# Patient Record
Sex: Female | Born: 1950
Health system: Southern US, Community
[De-identification: ages and names within clinical notes are randomized; demographics above are authoritative.]

## PROBLEM LIST (undated history)

## (undated) DIAGNOSIS — M1990 Unspecified osteoarthritis, unspecified site: Secondary | ICD-10-CM

## (undated) DIAGNOSIS — E876 Hypokalemia: Secondary | ICD-10-CM

## (undated) DIAGNOSIS — I10 Essential (primary) hypertension: Secondary | ICD-10-CM

## (undated) DIAGNOSIS — H269 Unspecified cataract: Secondary | ICD-10-CM

## (undated) DIAGNOSIS — Z9289 Personal history of other medical treatment: Secondary | ICD-10-CM

## (undated) DIAGNOSIS — E785 Hyperlipidemia, unspecified: Secondary | ICD-10-CM

## (undated) DIAGNOSIS — N952 Postmenopausal atrophic vaginitis: Secondary | ICD-10-CM

## (undated) DIAGNOSIS — D649 Anemia, unspecified: Secondary | ICD-10-CM

## (undated) DIAGNOSIS — R609 Edema, unspecified: Secondary | ICD-10-CM

## (undated) DIAGNOSIS — G479 Sleep disorder, unspecified: Secondary | ICD-10-CM

## (undated) HISTORY — DX: Edema, unspecified: R60.9

## (undated) HISTORY — DX: Essential (primary) hypertension: I10

## (undated) HISTORY — PX: BREAST EXCISIONAL BIOPSY: SUR124

## (undated) HISTORY — DX: Hyperlipidemia, unspecified: E78.5

## (undated) HISTORY — DX: Sleep disorder, unspecified: G47.9

## (undated) HISTORY — DX: Personal history of other medical treatment: Z92.89

## (undated) HISTORY — DX: Anemia, unspecified: D64.9

## (undated) HISTORY — DX: Postmenopausal atrophic vaginitis: N95.2

## (undated) HISTORY — PX: BREAST LUMPECTOMY: SHX2

## (undated) HISTORY — DX: Hypokalemia: E87.6

## (undated) HISTORY — PX: POLYPECTOMY: SHX149

## (undated) HISTORY — DX: Unspecified osteoarthritis, unspecified site: M19.90

## (undated) HISTORY — PX: COLONOSCOPY: SHX174

## (undated) HISTORY — PX: ABDOMINAL HYSTERECTOMY: SHX81

## (undated) HISTORY — DX: Unspecified cataract: H26.9

---

## 1998-05-03 ENCOUNTER — Encounter: Payer: Self-pay | Admitting: *Deleted

## 1998-05-03 ENCOUNTER — Ambulatory Visit (HOSPITAL_COMMUNITY): Admission: RE | Admit: 1998-05-03 | Discharge: 1998-05-03 | Payer: Self-pay | Admitting: *Deleted

## 1999-05-12 ENCOUNTER — Encounter: Payer: Self-pay | Admitting: Obstetrics and Gynecology

## 1999-05-12 ENCOUNTER — Encounter: Admission: RE | Admit: 1999-05-12 | Discharge: 1999-05-12 | Payer: Self-pay | Admitting: Obstetrics and Gynecology

## 2000-05-17 ENCOUNTER — Encounter: Payer: Self-pay | Admitting: Obstetrics and Gynecology

## 2000-05-17 ENCOUNTER — Encounter: Admission: RE | Admit: 2000-05-17 | Discharge: 2000-05-17 | Payer: Self-pay | Admitting: Obstetrics and Gynecology

## 2001-07-05 ENCOUNTER — Encounter: Payer: Self-pay | Admitting: Obstetrics and Gynecology

## 2001-07-05 ENCOUNTER — Encounter: Admission: RE | Admit: 2001-07-05 | Discharge: 2001-07-05 | Payer: Self-pay | Admitting: Obstetrics and Gynecology

## 2003-03-08 ENCOUNTER — Encounter: Admission: RE | Admit: 2003-03-08 | Discharge: 2003-03-08 | Payer: Self-pay | Admitting: Obstetrics and Gynecology

## 2004-04-03 ENCOUNTER — Emergency Department (HOSPITAL_COMMUNITY): Admission: EM | Admit: 2004-04-03 | Discharge: 2004-04-03 | Payer: Self-pay | Admitting: Emergency Medicine

## 2004-04-16 ENCOUNTER — Encounter: Admission: RE | Admit: 2004-04-16 | Discharge: 2004-04-16 | Payer: Self-pay | Admitting: Obstetrics and Gynecology

## 2005-05-14 ENCOUNTER — Encounter: Admission: RE | Admit: 2005-05-14 | Discharge: 2005-05-14 | Payer: Self-pay | Admitting: Obstetrics and Gynecology

## 2005-07-27 ENCOUNTER — Ambulatory Visit (HOSPITAL_COMMUNITY): Admission: RE | Admit: 2005-07-27 | Discharge: 2005-07-27 | Payer: Self-pay | Admitting: *Deleted

## 2006-05-17 ENCOUNTER — Encounter: Admission: RE | Admit: 2006-05-17 | Discharge: 2006-05-17 | Payer: Self-pay | Admitting: Obstetrics and Gynecology

## 2007-05-25 ENCOUNTER — Encounter: Admission: RE | Admit: 2007-05-25 | Discharge: 2007-05-25 | Payer: Self-pay | Admitting: Obstetrics and Gynecology

## 2008-05-25 ENCOUNTER — Encounter: Admission: RE | Admit: 2008-05-25 | Discharge: 2008-05-25 | Payer: Self-pay | Admitting: Obstetrics and Gynecology

## 2009-02-21 ENCOUNTER — Ambulatory Visit: Payer: Self-pay | Admitting: Family Medicine

## 2009-04-11 ENCOUNTER — Ambulatory Visit: Payer: Self-pay | Admitting: Family Medicine

## 2009-05-13 ENCOUNTER — Ambulatory Visit: Payer: Self-pay | Admitting: Family Medicine

## 2009-05-20 ENCOUNTER — Telehealth (INDEPENDENT_AMBULATORY_CARE_PROVIDER_SITE_OTHER): Payer: Self-pay

## 2009-05-21 ENCOUNTER — Encounter (HOSPITAL_COMMUNITY): Admission: RE | Admit: 2009-05-21 | Discharge: 2009-07-31 | Payer: Self-pay | Admitting: Family Medicine

## 2009-05-21 ENCOUNTER — Ambulatory Visit: Payer: Self-pay

## 2009-05-21 ENCOUNTER — Ambulatory Visit: Payer: Self-pay | Admitting: Cardiovascular Disease

## 2009-05-27 ENCOUNTER — Encounter: Admission: RE | Admit: 2009-05-27 | Discharge: 2009-05-27 | Payer: Self-pay | Admitting: Obstetrics and Gynecology

## 2009-07-31 ENCOUNTER — Ambulatory Visit: Payer: Self-pay | Admitting: Family Medicine

## 2009-08-13 ENCOUNTER — Ambulatory Visit: Payer: Self-pay | Admitting: Family Medicine

## 2009-11-11 ENCOUNTER — Ambulatory Visit: Payer: Self-pay | Admitting: Family Medicine

## 2009-12-09 ENCOUNTER — Ambulatory Visit: Payer: Self-pay | Admitting: Family Medicine

## 2010-03-27 ENCOUNTER — Ambulatory Visit: Payer: Self-pay | Admitting: Family Medicine

## 2010-04-02 ENCOUNTER — Ambulatory Visit: Payer: Self-pay | Admitting: Family Medicine

## 2010-05-17 ENCOUNTER — Encounter: Payer: Self-pay | Admitting: Obstetrics and Gynecology

## 2010-05-17 ENCOUNTER — Other Ambulatory Visit: Payer: Self-pay | Admitting: Obstetrics and Gynecology

## 2010-05-17 DIAGNOSIS — Z0000 Encounter for general adult medical examination without abnormal findings: Secondary | ICD-10-CM

## 2010-05-28 ENCOUNTER — Ambulatory Visit: Payer: Self-pay

## 2010-05-29 NOTE — Assessment & Plan Note (Signed)
Summary: Cardiology Nuclear Study  Nuclear Med Background Indications for Stress Test: Evaluation for Ischemia, Abnormal EKG    History Comments: NO DOCUMENTED CAD  Symptoms: Chest Pressure, Fatigue, Palpitations, SOB  Symptoms Comments: c/o (L) arm numbness. Last episode of ZO:XWRU past weekend.   Nuclear Pre-Procedure Cardiac Risk Factors: History of Smoking, Hypertension, Lipids, Obesity Caffeine/Decaff Intake: None NPO After: 9:00 PM Lungs: Clear IV 0.9% NS with Angio Cath: 22g     IV Site: (R) AC IV Started by: Irean Hong RN Chest Size (in) 44     Cup Size D     Height (in): 64.5 Weight (lb): 201 BMI: 34.09  Nuclear Med Study 1 or 2 day study:  1 day     Stress Test Type:  Stress Reading MD:  Charlton Haws, MD     Referring MD:  Standley Dakins, MD Resting Radionuclide:  Technetium 60m Tetrofosmin     Resting Radionuclide Dose:  10.8 mCi  Stress Radionuclide:  Technetium 5m Tetrofosmin     Stress Radionuclide Dose:  33.0 mCi   Stress Protocol Exercise Time (min):  7:15 min     Max HR:  150 bpm     Predicted Max HR:  164 bpm  Max Systolic BP: 159 mm Hg     Percent Max HR:  91.46 %     METS: 8.9 Rate Pressure Product:  04540    Stress Test Technologist:  [employer] CMA-N     Nuclear Technologist:  Burna Mortimer Deal RT-N  Rest Procedure  Myocardial perfusion imaging was performed at rest 45 minutes following the intravenous administration of Myoview Technetium 86m Tetrofosmin.  Stress Procedure  The patient exercised for 7:15.  The patient stopped due to fatigue.  She c/o slight, nonspecific chest pressure at peak exercise, that resolved immediately post exercise.  There were no significant ST-T wave changes.  Myoview was injected at peak exercise and myocardial perfusion imaging was performed after a brief delay.  QPS Raw Data Images:  Normal; no motion artifact; normal heart/lung ratio. Stress Images:  NI: Uniform and normal uptake of tracer in all myocardial  segments. Rest Images:  Normal homogeneous uptake in all areas of the myocardium. Subtraction (SDS):  Normal Transient Ischemic Dilatation:  1.14  (Normal <1.22)  Lung/Heart Ratio:  .32  (Normal <0.45)  Quantitative Gated Spect Images QGS EDV:  54 ml QGS ESV:  12 ml QGS EF:  78 % QGS cine images:  normal  Findings Normal nuclear study      Overall Impression  Exercise Capacity: Fair exercise capacity. BP Response: Normal blood pressure response. Clinical Symptoms: Fatigue ECG Impression: No significant ST segment change suggestive of ischemia. Overall Impression: Normal stress nuclear study. Overall Impression Comments: Normal

## 2010-05-29 NOTE — Progress Notes (Signed)
Summary: Nuc. Pre-Procedure  Phone Note Outgoing Call Call back at [facility] Phone [phone]   Call placed by: Irean Hong, RN,  May 20, 2009 11:33 AM Summary of Call: Reviewed information on Myoview Information Sheet (see scanned document for further details).  Spoke with patient.     Nuclear Med Background Indications for Stress Test: Evaluation for Ischemia, Abnormal EKG     Symptoms: Chest Pain, Chest Pressure, SOB  Symptoms Comments: Radiates to back.   Nuclear Pre-Procedure Cardiac Risk Factors: History of Smoking, Hypertension, Lipids

## 2010-06-16 ENCOUNTER — Ambulatory Visit (INDEPENDENT_AMBULATORY_CARE_PROVIDER_SITE_OTHER): Payer: Federal, State, Local not specified - PPO | Admitting: Family Medicine

## 2010-06-16 ENCOUNTER — Ambulatory Visit
Admission: RE | Admit: 2010-06-16 | Discharge: 2010-06-16 | Disposition: A | Discharge status: Home / Self Care | Payer: Federal, State, Local not specified - PPO | Source: Ambulatory Visit | Attending: Obstetrics and Gynecology | Admitting: Obstetrics and Gynecology

## 2010-06-16 DIAGNOSIS — Z79899 Other long term (current) drug therapy: Secondary | ICD-10-CM

## 2010-06-16 DIAGNOSIS — I10 Essential (primary) hypertension: Secondary | ICD-10-CM

## 2010-06-16 DIAGNOSIS — Z0000 Encounter for general adult medical examination without abnormal findings: Secondary | ICD-10-CM

## 2010-06-16 DIAGNOSIS — E871 Hypo-osmolality and hyponatremia: Secondary | ICD-10-CM

## 2010-09-12 NOTE — Op Note (Signed)
[name], Lisa Hutchinson              ACCOUNT NO.:  [id number]   MEDICAL RECORD NO.:  [id number]          PATIENT TYPE:  AMB   LOCATION:  ENDO                         FACILITY:  [facility]   PHYSICIAN:  Georgiana Spinner, M.D.    DATE OF BIRTH:  1950-05-09   DATE OF PROCEDURE:  DATE OF DISCHARGE:                                 OPERATIVE REPORT   PROCEDURE:  Colonoscopy.   INDICATIONS:  Colon cancer screening.   ANESTHESIA:  Fentanyl 100 mcg, Versed 8 mg.   PROCEDURE:  With the patient mildly sedated in the left lateral decubitus  position, the Olympus videoscopic colonoscope was inserted into the rectum  and passed under direct vision to the cecum, identified by the ileocecal  valve and base of the cecum.  From this point, the colonoscope was slowly  withdrawn, taking circumferential views of the colonic mucosa, stopping only  in the rectum, which appeared normal on direct and showed hemorrhoids on  retroflexed view.  The endoscope was straightened and withdrawn.  Patient's  vital signs and pulse oximetry remained stable.  Patient tolerated the  procedure well without apparent complications.   FINDINGS:  Internal hemorrhoids, otherwise unremarkable colonoscopic  examination to the cecum.   PLAN:  Consider repeat examination in 5-10 years.           ______________________________  Georgiana Spinner, M.D.     GMO/MEDQ  D:  07/27/2005  T:  07/28/2005  Job:  [id number]   cc:   [facility]   S. Kyra Manges, M.D.  Fax: [phone]

## 2010-10-10 ENCOUNTER — Telehealth: Payer: Self-pay | Admitting: *Deleted

## 2010-10-10 ENCOUNTER — Ambulatory Visit (AMBULATORY_SURGERY_CENTER): Payer: Federal, State, Local not specified - PPO | Admitting: *Deleted

## 2010-10-10 VITALS — Ht 64.0 in | Wt 186.2 lb

## 2010-10-10 DIAGNOSIS — Z1211 Encounter for screening for malignant neoplasm of colon: Secondary | ICD-10-CM

## 2010-10-10 MED ORDER — PEG-KCL-NACL-NASULF-NA ASC-C 100 G PO SOLR: ORAL | Status: DC

## 2010-10-10 NOTE — Telephone Encounter (Signed)
Patient here today for previsit. She has had colonoscopy in 2007 with Dr.Orr (information in [place]) showing int. Hemorrhoids only. He documented to repeat colonoscopy in 5-10 years. Patient denies any GI problems or symptoms today. She did state that her mom had colon surgery but no colon cancer and no one in her family has colon cancer. We looked up her mom's information in E-chart and it showed she had right colectomy due to ascending colon mass-"inflammatory looking lesion" and also states she had adenomatous polyps removed. Is it okay for patient to go ahead and have screening colonoscopy? Thanks [employer]

## 2010-10-13 ENCOUNTER — Encounter: Payer: Self-pay | Admitting: Gastroenterology

## 2010-10-13 NOTE — Telephone Encounter (Signed)
Robbin, let me know if you need anything.

## 2010-10-13 NOTE — Telephone Encounter (Signed)
yes

## 2010-10-22 ENCOUNTER — Encounter: Payer: Self-pay | Admitting: Gastroenterology

## 2010-10-22 ENCOUNTER — Other Ambulatory Visit: Payer: Federal, State, Local not specified - PPO | Admitting: Gastroenterology

## 2010-10-22 ENCOUNTER — Ambulatory Visit (AMBULATORY_SURGERY_CENTER): Payer: Federal, State, Local not specified - PPO | Admitting: Gastroenterology

## 2010-10-22 DIAGNOSIS — Z1211 Encounter for screening for malignant neoplasm of colon: Secondary | ICD-10-CM | POA: Insufficient documentation

## 2010-10-22 DIAGNOSIS — Z83719 Family history of colon polyps, unspecified: Secondary | ICD-10-CM

## 2010-10-22 DIAGNOSIS — Z8371 Family history of colonic polyps: Secondary | ICD-10-CM

## 2010-10-22 DIAGNOSIS — D126 Benign neoplasm of colon, unspecified: Secondary | ICD-10-CM

## 2010-10-22 DIAGNOSIS — K644 Residual hemorrhoidal skin tags: Secondary | ICD-10-CM

## 2010-10-22 DIAGNOSIS — Z860101 Personal history of adenomatous and serrated colon polyps: Secondary | ICD-10-CM | POA: Insufficient documentation

## 2010-10-22 DIAGNOSIS — Z86010 Personal history of colonic polyps: Secondary | ICD-10-CM | POA: Insufficient documentation

## 2010-10-22 MED ORDER — SODIUM CHLORIDE 0.9 % IV SOLN: 500.0000 mL | INTRAVENOUS | Status: DC

## 2010-10-22 NOTE — Patient Instructions (Signed)
Please read the handouts given to you by your recovery room nurse.   We will send you a letter within a couple of weeks telling you when to come back and what type of polyp.   Resume your routine medications today.   Call us if you have any questions or concerns at [phone]. Thank-you.

## 2010-10-23 ENCOUNTER — Telehealth: Payer: Self-pay

## 2010-10-23 NOTE — Telephone Encounter (Signed)
No ID on answering machine. 

## 2010-11-02 ENCOUNTER — Encounter: Payer: Self-pay | Admitting: Gastroenterology

## 2010-11-07 ENCOUNTER — Telehealth: Payer: Self-pay | Admitting: Family Medicine

## 2010-11-07 MED ORDER — POTASSIUM CHLORIDE CRYS ER 10 MEQ PO TBCR: 10.0000 meq | EXTENDED_RELEASE_TABLET | Freq: Every day | ORAL | Status: DC

## 2010-11-07 NOTE — Telephone Encounter (Signed)
DONE

## 2010-12-05 ENCOUNTER — Encounter: Payer: Self-pay | Admitting: Family Medicine

## 2010-12-05 ENCOUNTER — Telehealth: Payer: Self-pay | Admitting: Family Medicine

## 2010-12-05 NOTE — Telephone Encounter (Signed)
Pt wants written potassium rx will pick up monday

## 2010-12-08 MED ORDER — POTASSIUM CHLORIDE CRYS ER 10 MEQ PO TBCR: 10.0000 meq | EXTENDED_RELEASE_TABLET | Freq: Every day | ORAL | Status: DC

## 2011-03-17 ENCOUNTER — Encounter: Payer: Self-pay | Admitting: Family Medicine

## 2011-03-17 ENCOUNTER — Ambulatory Visit (INDEPENDENT_AMBULATORY_CARE_PROVIDER_SITE_OTHER): Payer: Federal, State, Local not specified - PPO | Admitting: Family Medicine

## 2011-03-17 VITALS — BP 128/88 | HR 74 | Wt 197.0 lb

## 2011-03-17 DIAGNOSIS — M76899 Other specified enthesopathies of unspecified lower limb, excluding foot: Secondary | ICD-10-CM

## 2011-03-17 DIAGNOSIS — M7062 Trochanteric bursitis, left hip: Secondary | ICD-10-CM

## 2011-03-17 NOTE — Patient Instructions (Signed)
Take 4 Advil 3 times per day. Use heat for 20 minutes 3 times per day. Return here if her no better after about 2 weeks. He can do whatever he wants it doesn't hurt.

## 2011-03-17 NOTE — Progress Notes (Signed)
  Subjective:    Patient ID: Lisa Hutchinson, female    DOB: 08/26/1950, 60 y.o.   MRN: [id number]  HPI She has a two-week history of left hip pain. No previous history of injuries. She has been walking regularly. She has tried OTC medications but only sparingly.   Review of Systems     Objective:   Physical Exam Full motion of the hip without pain. Back exam shows no tenderness to palpation. She does have slight tenderness over the greater trochanter.       Assessment & Plan:   1. Trochanteric bursitis of left hip    I discussed options with her concerning care and will start initially with ibuprofen 800 mg 3 times a day as well as heat. If this is not successful, recommend she return here for an injection.

## 2011-03-30 ENCOUNTER — Encounter: Payer: Self-pay | Admitting: Family Medicine

## 2011-03-30 ENCOUNTER — Ambulatory Visit (INDEPENDENT_AMBULATORY_CARE_PROVIDER_SITE_OTHER): Payer: Federal, State, Local not specified - PPO | Admitting: Family Medicine

## 2011-03-30 VITALS — BP 110/72 | HR 80 | Wt 195.0 lb

## 2011-03-30 DIAGNOSIS — M7062 Trochanteric bursitis, left hip: Secondary | ICD-10-CM

## 2011-03-30 DIAGNOSIS — M76899 Other specified enthesopathies of unspecified lower limb, excluding foot: Secondary | ICD-10-CM

## 2011-03-30 NOTE — Patient Instructions (Signed)
If this does not take the pain away, come back for further evaluation.

## 2011-03-30 NOTE — Progress Notes (Signed)
  Subjective:    Patient ID: Lisa Hutchinson, female    DOB: 09/03/1950, 60 y.o.   MRN: [id number]  HPI She continues to complain of left hip pain. She did not get much relief with the use of anti-inflammatory medications.   Review of Systems     Objective:   Physical Exam Good motion of the hip with tenderness to palpation over the greater trochanter. Questionable tenderness over the upper left SI joint with good motion of her back.       Assessment & Plan:   1. Trochanteric bursitis of left hip    40 mg of Kenalog and 2 cc of Xylocaine was injected into the left greater trochanter. She did obtain relief of her symptoms. She tolerated the procedure well. She will return here if continued difficulty.

## 2011-05-14 ENCOUNTER — Telehealth: Payer: Self-pay | Admitting: Internal Medicine

## 2011-05-14 MED ORDER — AMLODIPINE BESYLATE 10 MG PO TABS: 10.0000 mg | ORAL_TABLET | Freq: Every day | ORAL | Status: DC

## 2011-05-14 NOTE — Telephone Encounter (Signed)
done

## 2011-05-27 ENCOUNTER — Ambulatory Visit
Admission: RE | Admit: 2011-05-27 | Discharge: 2011-05-27 | Disposition: A | Discharge status: Home / Self Care | Payer: Federal, State, Local not specified - PPO | Source: Ambulatory Visit | Attending: Family Medicine | Admitting: Family Medicine

## 2011-05-27 ENCOUNTER — Ambulatory Visit (INDEPENDENT_AMBULATORY_CARE_PROVIDER_SITE_OTHER): Payer: Federal, State, Local not specified - PPO | Admitting: Family Medicine

## 2011-05-27 ENCOUNTER — Encounter: Payer: Self-pay | Admitting: Family Medicine

## 2011-05-27 DIAGNOSIS — Z79899 Other long term (current) drug therapy: Secondary | ICD-10-CM

## 2011-05-27 DIAGNOSIS — I10 Essential (primary) hypertension: Secondary | ICD-10-CM

## 2011-05-27 DIAGNOSIS — Z862 Personal history of diseases of the blood and blood-forming organs and certain disorders involving the immune mechanism: Secondary | ICD-10-CM

## 2011-05-27 DIAGNOSIS — M549 Dorsalgia, unspecified: Secondary | ICD-10-CM

## 2011-05-27 DIAGNOSIS — Z8639 Personal history of other endocrine, nutritional and metabolic disease: Secondary | ICD-10-CM

## 2011-05-27 DIAGNOSIS — D126 Benign neoplasm of colon, unspecified: Secondary | ICD-10-CM

## 2011-05-27 LAB — CBC WITH DIFFERENTIAL/PLATELET
Basophils Absolute: 0 10*3/uL (ref 0.0–0.1)
Basophils Relative: 0 % (ref 0–1)
Eosinophils Absolute: 0.3 10*3/uL (ref 0.0–0.7)
Eosinophils Relative: 4 % (ref 0–5)
HCT: 43.4 % (ref 36.0–46.0)
Hemoglobin: 13.8 g/dL (ref 12.0–15.0)
Lymphocytes Relative: 46 % (ref 12–46)
MCH: 27.7 pg (ref 26.0–34.0)
MCHC: 31.8 g/dL (ref 30.0–36.0)
MCV: 87.1 fL (ref 78.0–100.0)
Monocytes Absolute: 0.8 10*3/uL (ref 0.1–1.0)
Monocytes Relative: 11 % (ref 3–12)
Neutro Abs: 2.7 10*3/uL (ref 1.7–7.7)
Neutrophils Relative %: 39 % — ABNORMAL LOW (ref 43–77)
Platelets: 281 10*3/uL (ref 150–400)
RBC: 4.98 MIL/uL (ref 3.87–5.11)
RDW: 13.6 % (ref 11.5–15.5)
WBC: 6.9 10*3/uL (ref 4.0–10.5)

## 2011-05-27 LAB — COMPREHENSIVE METABOLIC PANEL
ALT: 8 U/L (ref 0–35)
AST: 22 U/L (ref 0–37)
Alkaline Phosphatase: 88 U/L (ref 39–117)
BUN: 9 mg/dL (ref 6–23)
CO2: 25 mEq/L (ref 19–32)
Calcium: 9.7 mg/dL (ref 8.4–10.5)
Chloride: 105 mEq/L (ref 96–112)
Creat: 0.79 mg/dL (ref 0.50–1.10)
Glucose, Bld: 72 mg/dL (ref 70–99)
Potassium: 3.9 mEq/L (ref 3.5–5.3)
Total Bilirubin: 0.5 mg/dL (ref 0.3–1.2)
Total Protein: 6.7 g/dL (ref 6.0–8.3)

## 2011-05-27 LAB — LIPID PANEL
Cholesterol: 210 mg/dL — ABNORMAL HIGH (ref 0–200)
LDL Cholesterol: 141 mg/dL — ABNORMAL HIGH (ref 0–99)
Total CHOL/HDL Ratio: 4 Ratio
VLDL: 16 mg/dL (ref 0–40)

## 2011-05-27 NOTE — Patient Instructions (Signed)
I will call you with the results of the x-ray when it comes in. You can use Advil or Aleve for the pain if you need to.

## 2011-05-27 NOTE — Progress Notes (Signed)
  Subjective:    Patient ID: Lisa Hutchinson, female    DOB: [birth year], 61 y.o.   MRN: [id number]  HPI She is here for a followup visit concerning her hip pain. She now states that the pain is more in her left back area. The shot did not give her relief for very long. Her medical record was reviewed. She does have a history of hypokalemia. She also continues on her blood pressure medications. She did have a recent colonoscopy which showed polyps and has a family history of colonic polyps.   Review of Systems     Objective:   Physical Exam Alert and in no distress. Exam of her back shows no tenderness to palpation over the lumbar spine or over the greater trochanter. Normal motion of the hip. DTRs normal. Pearlean Brownie testing was equivocal. X-ray shows degenerative changes.      Assessment & Plan:   1. Benign neoplasm of colon    2. History of hypokalemia    3. Hypertension    4. Encounter for long-term (current) use of other medications  CBC with Differential, Comprehensive metabolic panel, Lipid panel  5. Back pain  DG Lumbar Spine Complete   recommend back exercises for her and if this does not improve, she should return here for further evaluation.

## 2011-06-16 ENCOUNTER — Other Ambulatory Visit: Payer: Self-pay | Admitting: Family Medicine

## 2011-06-16 DIAGNOSIS — Z1231 Encounter for screening mammogram for malignant neoplasm of breast: Secondary | ICD-10-CM

## 2011-06-25 ENCOUNTER — Ambulatory Visit
Admission: RE | Admit: 2011-06-25 | Discharge: 2011-06-25 | Disposition: A | Discharge status: Home / Self Care | Payer: Federal, State, Local not specified - PPO | Source: Ambulatory Visit | Attending: Family Medicine | Admitting: Family Medicine

## 2011-06-25 DIAGNOSIS — Z1231 Encounter for screening mammogram for malignant neoplasm of breast: Secondary | ICD-10-CM

## 2011-10-01 ENCOUNTER — Ambulatory Visit (INDEPENDENT_AMBULATORY_CARE_PROVIDER_SITE_OTHER): Payer: Federal, State, Local not specified - PPO | Admitting: Medical

## 2011-10-01 ENCOUNTER — Encounter: Payer: Self-pay | Admitting: Medical

## 2011-10-01 VITALS — BP 132/80 | HR 72 | Temp 97.7°F | Resp 16 | Wt 200.0 lb

## 2011-10-01 DIAGNOSIS — Z129 Encounter for screening for malignant neoplasm, site unspecified: Secondary | ICD-10-CM

## 2011-10-01 DIAGNOSIS — I10 Essential (primary) hypertension: Secondary | ICD-10-CM

## 2011-10-01 DIAGNOSIS — R609 Edema, unspecified: Secondary | ICD-10-CM

## 2011-10-01 DIAGNOSIS — R0989 Other specified symptoms and signs involving the circulatory and respiratory systems: Secondary | ICD-10-CM

## 2011-10-01 DIAGNOSIS — G4730 Sleep apnea, unspecified: Secondary | ICD-10-CM

## 2011-10-01 DIAGNOSIS — R5383 Other fatigue: Secondary | ICD-10-CM

## 2011-10-01 DIAGNOSIS — R0683 Snoring: Secondary | ICD-10-CM

## 2011-10-01 MED ORDER — AMLODIPINE BESYLATE 5 MG PO TABS: 5.0000 mg | ORAL_TABLET | Freq: Every day | ORAL | Status: DC

## 2011-10-01 MED ORDER — HYDROCHLOROTHIAZIDE 12.5 MG PO TABS: 12.5000 mg | ORAL_TABLET | Freq: Every day | ORAL | Status: DC

## 2011-10-01 NOTE — Progress Notes (Signed)
Subjective:   HPI  Lisa Hutchinson is a 60 y.o. female who presents for concern for sleep apnea.  She does report snoring, and friend recorded her while she was sleeping.  There was 12 seconds between breaths.  She wakes up a lot a night.  She does not feel rested in the morning.  She would like a sleep study.  She reports swelling in legs in the feet, ankles, and calves.  She notes being on combo BP pill with diuretic in the past.   She wonders if she needs to be back on the fluid pill.  No CP, SOB, DOE, decreased urine output.  No other aggravating or relieving factors.    She notes last pap smear/pelvic exam was 2 years ago.  Wants to update this today.  Last mammogram earlier this year.   No other c/o.  The following portions of the patient's history were reviewed and updated as appropriate: allergies, current medications, past family history, past medical history, past social history, past surgical history and problem list.  Past Medical History  Diagnosis Date  . Hypertension   . Hyperlipidemia     Allergies  Allergen Reactions  . Sulfonamide Derivatives Hives    Review of Systems ROS reviewed and was negative other than noted in HPI or above.    Objective:   Physical Exam  General appearance: alert, no distress, WD/WN, AA female HEENT: normocephalic, sclerae anicteric, TMs pearly, nares patent, no discharge or erythema, pharynx normal Oral cavity: MMM, no lesions Neck: supple, no lymphadenopathy, no thyromegaly, no masses Heart: RRR, normal S1, S2, no murmurs Lungs: CTA bilaterally, no wheezes, rhonchi, or rales Abdomen: +bs, soft, non tender, non distended, no masses, no hepatomegaly, no splenomegaly Pulses: 2+ symmetric, upper and lower extremities, normal cap refill Breast: nontender, no masses or lumps, no skin changes, no nipple discharge or inversion, no axillary lymphadenopathy Gyn: Normal external genitalia without lesions, vagina with normal mucosa, s/p  hysterectomy, mild bulge of the bladder suggesting mild prolapse, no abnormal vaginal discharge.  Uterus and adnexa not enlarged, nontender, no masses.  Exam chaperoned by nurse. Rectal: anus normal, occult negative stool, no lesions; gyn exam performed by PA student Frances Furbish, supervised by me.    Assessment and Plan :   Encounter Diagnoses  Name Primary?  . Sleep apnea Yes  . Snoring   . Fatigue   . Essential hypertension, benign   . Edema   . Screening for cancer    Sleep apnea, fatigue, snoring - will refer for split night sleep study.  Discussed weight loss, raising head of bed.  HTN - will decreased norvasc to 5mg  daily, and begin HCTZ 12.5mg  daily for HTN and edema.   Edema - leg elevation, consider OTC compression hose, begin HCTZ for HTN and edema.  Screening for cancer - reviewed most recent mammogram from 06/16/11.  Breast, pelvic and rectal exam today.  Occult negative stool.  Mild atrophic vaginal changes noted.  Discussed premarin cream but she declines.  Requested records from prior pap smears/gynecology.

## 2011-10-01 NOTE — Patient Instructions (Signed)
We will set you up for a sleep study.   Begin Hydrochlorothiazide 12.5 mg once daily.  Start taking 1/2 tablet of the Norvasc once daily.  Lets see you back in 1 month for recheck and labs.   Use leg elevation and regular exercise for swelling.

## 2011-10-02 ENCOUNTER — Ambulatory Visit (HOSPITAL_BASED_OUTPATIENT_CLINIC_OR_DEPARTMENT_OTHER): Payer: Federal, State, Local not specified - PPO | Attending: Medical

## 2011-10-02 VITALS — Ht 65.0 in | Wt 200.0 lb

## 2011-10-02 DIAGNOSIS — G4710 Hypersomnia, unspecified: Secondary | ICD-10-CM | POA: Insufficient documentation

## 2011-10-02 DIAGNOSIS — Z9989 Dependence on other enabling machines and devices: Secondary | ICD-10-CM

## 2011-10-10 DIAGNOSIS — G4730 Sleep apnea, unspecified: Secondary | ICD-10-CM

## 2011-10-10 DIAGNOSIS — G4710 Hypersomnia, unspecified: Secondary | ICD-10-CM

## 2011-10-10 NOTE — Procedures (Signed)
NAMELASHONA, SCHAAF              ACCOUNT NO.:  [id number]  MEDICAL RECORD NO.:  [id number]          PATIENT TYPE:  OUT  LOCATION:  SLEEP CENTER                 FACILITY:  [facility]  PHYSICIAN:  Decklyn Hornik D. Maple Hudson, MD, FCCP, FACPDATE OF BIRTH:  1950/08/25  DATE OF STUDY:  10/02/2011                           NOCTURNAL POLYSOMNOGRAM  REFERRING PHYSICIAN:  Crosby Oyster, PA  INDICATION FOR STUDY:  Hypersomnia with sleep apnea.  EPWORTH SLEEPINESS SCORE:  5/24.  BMI 33.3, weight 200 pounds, height 65 inches, neck 13 inches.  MEDICATIONS:  Charted and reviewed.  SLEEP ARCHITECTURE:  Split study protocol.  During the diagnostic phase, total sleep time 162 minutes with sleep efficiency 73.5%.  Stage I was 7.4%, stage II 66.4%, stage III 8.6%, REM 17.6% of total sleep time. Sleep latency 23.5 minutes, REM latency 97 minutes, awake after sleep onset 28.5 minutes.  Arousal index 22.6.  Bedtime medication:  None.  RESPIRATORY DATA:  Split study protocol.  Apnea/hypopnea index (AHI) 3.7 per hour.  A total of 10 events was scored including 1 obstructive apnea, 1 central apnea, and 8 hypopneas.  CPAP was titrated to 7 CWP, AHI 2.6 per hour.  She wore a medium ResMed Quattro FX fullface mask with heated humidifier.  OXYGEN DATA:  Moderate snoring before CPAP with oxygen desaturation to a nadir of 90% on room air.  After CPAP, snoring was prevented and mean oxygen saturation held 93.4% on room air.  CARDIAC DATA:  Sinus rhythm with PACs.  MOVEMENT/PARASOMNIA:  No significant movement disturbance.  Bathroom x1.  IMPRESSION/RECOMMENDATION: 1. Occasional respiratory events with sleep disturbance, within normal     limits.  AHI 3.7 per hour (the normal range for adults is from 0-5     events per hour).  Moderate snoring with oxygen desaturation to a     nadir of 90% on room air. 2. CPAP titration was done to 7 CWP, AHI 2.6 per hour.  She wore a     medium ResMed Quattro FX fullface mask with  heated humidifier.     Ordinarily baseline scores in her range will not be treated with     CPAP.     Genia Perin D. Maple Hudson, MD, [facility], FACP Diplomate, American Board of Sleep Medicine    CDY/MEDQ  D:  10/10/2011 09:02:18  T:  10/10/2011 10:07:01  Job:  [id number]

## 2011-10-14 ENCOUNTER — Telehealth: Payer: Self-pay | Admitting: Medical

## 2011-10-14 NOTE — Telephone Encounter (Signed)
Patient called to check on results of her sleep study, please call

## 2011-10-14 NOTE — Telephone Encounter (Signed)
Karren Burly - ask Lafonda Mosses to talk with Epic people.  I am not getting notifications that sleep study results are in.  Her sleep study is in Epic, but nothing prompted me that results were ready until pt called back.  Pls call pt - i apologize for not calling sooner, but I didn't see results available as they normally come in.  Fortunately she called which prompted me to hunt down the results.    Her sleep study is normal, no sleep apnea.  The main recommendation would be weight loss, possibly elevating the head of the bed, and may want to see her dentist for possible mouthpiece device to help with snoring and positioning of the tongue.   She is due back at this time for recheck on swelling and the fluid pill.

## 2011-10-16 NOTE — Telephone Encounter (Signed)
LMOM NOTIFYING THE PATIENT OF HER SLEEP STUDY RESULTS. CLS

## 2011-10-19 ENCOUNTER — Ambulatory Visit: Payer: Federal, State, Local not specified - PPO | Admitting: Medical

## 2011-10-27 ENCOUNTER — Encounter: Payer: Self-pay | Admitting: Medical

## 2011-10-28 ENCOUNTER — Ambulatory Visit (INDEPENDENT_AMBULATORY_CARE_PROVIDER_SITE_OTHER): Payer: Federal, State, Local not specified - PPO | Admitting: Medical

## 2011-10-28 ENCOUNTER — Encounter: Payer: Self-pay | Admitting: Medical

## 2011-10-28 VITALS — BP 122/82 | HR 84 | Temp 97.9°F | Resp 16 | Wt 193.0 lb

## 2011-10-28 DIAGNOSIS — R609 Edema, unspecified: Secondary | ICD-10-CM

## 2011-10-28 DIAGNOSIS — E876 Hypokalemia: Secondary | ICD-10-CM

## 2011-10-28 DIAGNOSIS — R0683 Snoring: Secondary | ICD-10-CM

## 2011-10-28 DIAGNOSIS — R0989 Other specified symptoms and signs involving the circulatory and respiratory systems: Secondary | ICD-10-CM

## 2011-10-28 DIAGNOSIS — I10 Essential (primary) hypertension: Secondary | ICD-10-CM

## 2011-10-28 DIAGNOSIS — E669 Obesity, unspecified: Secondary | ICD-10-CM

## 2011-10-28 DIAGNOSIS — N952 Postmenopausal atrophic vaginitis: Secondary | ICD-10-CM

## 2011-10-28 DIAGNOSIS — R0609 Other forms of dyspnea: Secondary | ICD-10-CM

## 2011-10-28 LAB — BASIC METABOLIC PANEL
BUN: 11 mg/dL (ref 6–23)
Chloride: 104 mEq/L (ref 96–112)
Creat: 0.66 mg/dL (ref 0.50–1.10)
Glucose, Bld: 80 mg/dL (ref 70–99)
Potassium: 3.6 mEq/L (ref 3.5–5.3)

## 2011-10-28 MED ORDER — ESTROGENS, CONJUGATED 0.625 MG/GM VA CREA: TOPICAL_CREAM | VAGINAL | Status: DC

## 2011-10-28 MED ORDER — AMLODIPINE BESYLATE 10 MG PO TABS: 10.0000 mg | ORAL_TABLET | Freq: Every day | ORAL | Status: DC

## 2011-10-28 NOTE — Progress Notes (Signed)
Subjective: Here for f/u   Last visit about a month ago we added HCTZ 12.5mg  to help with both fluid and BP.   She notes that this is working well.  She didn't go to Amlodipine 5mg , she continued 10mg  as the 5mg  wasn't working strong enough.  Thus, she is taking Amlodipine 10mg  + HCTZ 12.5mg .  This has helped the swelling and her BP as been fine.  She denies any new problems other than trying to lose weight.  She walks 2 miles daily.   She is here to discuss the sleep study from last visit too.  No other c/o.  The following portions of the patient's history were reviewed and updated as appropriate: allergies, current medications, past family history, past medical history, past social history, past surgical history and problem list.  Past Medical History  Diagnosis Date  . Hypertension   . Hyperlipidemia   . Edema   . Hypokalemia     Allergies  Allergen Reactions  . Sulfonamide Derivatives Hives     Review of Systems ROS reviewed and was negative other than noted in HPI or above.    Objective:   Physical Exam  General appearance: alert, no distress, WD/WN Neck: supple, no lymphadenopathy, no thyromegaly, no masses Heart: RRR, normal S1, S2, no murmurs Lungs: CTA bilaterally, no wheezes, rhonchi, or rales Pulses: 2+ symmetric, upper and lower extremities, normal cap refill   Assessment and Plan :     Encounter Diagnoses  Name Primary?  . Essential hypertension, benign Yes  . Edema   . Hypokalemia   . Obesity   . Atrophic vaginitis   . Snoring    HTN - at goal.  C/t Amlodipine 10mg , HCTZ 12.5mg  daily  Edema - improved on HCTZ 12.5mg  daily  Hypokalemia - c/t K dur and labs today  Obesity - discussed diet, c/t efforts with diet and exercise to lose weight  Atrophic vaginitis, mild - begin Premarin cream. Discussed risks/benefits  Snoring - advised weight loss, raise head of bed

## 2011-11-03 ENCOUNTER — Ambulatory Visit: Payer: Federal, State, Local not specified - PPO | Admitting: Medical

## 2012-01-03 ENCOUNTER — Other Ambulatory Visit: Payer: Self-pay | Admitting: Family Medicine

## 2012-02-01 ENCOUNTER — Other Ambulatory Visit: Payer: Self-pay | Admitting: Medical

## 2012-02-08 ENCOUNTER — Ambulatory Visit
Admission: RE | Admit: 2012-02-08 | Discharge: 2012-02-08 | Disposition: A | Discharge status: Home / Self Care | Payer: Federal, State, Local not specified - PPO | Source: Ambulatory Visit | Attending: Family Medicine | Admitting: Family Medicine

## 2012-02-08 ENCOUNTER — Ambulatory Visit (INDEPENDENT_AMBULATORY_CARE_PROVIDER_SITE_OTHER): Payer: Federal, State, Local not specified - PPO | Admitting: Family Medicine

## 2012-02-08 ENCOUNTER — Encounter: Payer: Self-pay | Admitting: Family Medicine

## 2012-02-08 VITALS — BP 122/78 | HR 84 | Wt 199.0 lb

## 2012-02-08 DIAGNOSIS — M1990 Unspecified osteoarthritis, unspecified site: Secondary | ICD-10-CM

## 2012-02-08 DIAGNOSIS — M25551 Pain in right hip: Secondary | ICD-10-CM

## 2012-02-08 DIAGNOSIS — M129 Arthropathy, unspecified: Secondary | ICD-10-CM

## 2012-02-08 DIAGNOSIS — M25559 Pain in unspecified hip: Secondary | ICD-10-CM

## 2012-02-08 NOTE — Progress Notes (Signed)
  Subjective:    Patient ID: Lisa Hutchinson, female    DOB: 14-May-1950, 61 y.o.   MRN: [id number]  HPI She is here for evaluation of right hip and groin as well as leg discomfort. She notes that the pain does get worse with walking however when she does water exercises, she has no difficulty. There's been no numbness but she does complain of some weakness. There is no history of injury.   Review of Systems     Objective:   Physical Exam Full motion of the hip without discomfort. No tenderness palpation of the greater trochanter or pelvic area. Reflexes normal. Good strength. X-ray shows degenerative changes.       Assessment & Plan:   1. Right hip pain  DG Hip Complete Right  2. Arthritis     Recommend Aleve or Advil ;also refer to orthopedics for followup.

## 2012-02-09 NOTE — Progress Notes (Signed)
Quick Note:  Faxed all info to [facility] ______

## 2012-05-03 ENCOUNTER — Other Ambulatory Visit: Payer: Self-pay | Admitting: Family Medicine

## 2012-05-03 DIAGNOSIS — Z9289 Personal history of other medical treatment: Secondary | ICD-10-CM

## 2012-05-03 DIAGNOSIS — Z1231 Encounter for screening mammogram for malignant neoplasm of breast: Secondary | ICD-10-CM

## 2012-05-03 HISTORY — DX: Personal history of other medical treatment: Z92.89

## 2012-05-06 ENCOUNTER — Other Ambulatory Visit: Payer: Self-pay | Admitting: Medical

## 2012-06-09 ENCOUNTER — Other Ambulatory Visit: Payer: Self-pay | Admitting: Family Medicine

## 2012-06-11 ENCOUNTER — Other Ambulatory Visit: Payer: Self-pay

## 2012-06-27 ENCOUNTER — Ambulatory Visit: Payer: Federal, State, Local not specified - PPO

## 2012-07-04 ENCOUNTER — Ambulatory Visit
Admission: RE | Admit: 2012-07-04 | Discharge: 2012-07-04 | Disposition: A | Discharge status: Home / Self Care | Payer: Federal, State, Local not specified - PPO | Source: Ambulatory Visit | Attending: Family Medicine | Admitting: Family Medicine

## 2012-07-04 DIAGNOSIS — Z1231 Encounter for screening mammogram for malignant neoplasm of breast: Secondary | ICD-10-CM

## 2012-07-06 ENCOUNTER — Telehealth: Payer: Self-pay | Admitting: Internal Medicine

## 2012-07-06 MED ORDER — POTASSIUM CHLORIDE CRYS ER 10 MEQ PO TBCR: 10.0000 meq | EXTENDED_RELEASE_TABLET | Freq: Every day | ORAL | Status: DC

## 2012-07-06 NOTE — Telephone Encounter (Signed)
Refill sent.

## 2012-07-09 ENCOUNTER — Other Ambulatory Visit: Payer: Self-pay | Admitting: Family Medicine

## 2012-07-18 ENCOUNTER — Ambulatory Visit (INDEPENDENT_AMBULATORY_CARE_PROVIDER_SITE_OTHER): Payer: Federal, State, Local not specified - PPO | Admitting: Medical

## 2012-07-18 ENCOUNTER — Encounter: Payer: Self-pay | Admitting: Medical

## 2012-07-18 VITALS — BP 128/82 | HR 80 | Temp 98.4°F | Resp 16 | Wt 197.0 lb

## 2012-07-18 DIAGNOSIS — E669 Obesity, unspecified: Secondary | ICD-10-CM

## 2012-07-18 DIAGNOSIS — E785 Hyperlipidemia, unspecified: Secondary | ICD-10-CM

## 2012-07-18 DIAGNOSIS — R252 Cramp and spasm: Secondary | ICD-10-CM

## 2012-07-18 DIAGNOSIS — M25551 Pain in right hip: Secondary | ICD-10-CM

## 2012-07-18 DIAGNOSIS — M25559 Pain in unspecified hip: Secondary | ICD-10-CM

## 2012-07-18 DIAGNOSIS — Z23 Encounter for immunization: Secondary | ICD-10-CM

## 2012-07-18 DIAGNOSIS — I10 Essential (primary) hypertension: Secondary | ICD-10-CM

## 2012-07-18 LAB — POCT URINALYSIS DIPSTICK
Bilirubin, UA: NEGATIVE
Glucose, UA: NEGATIVE
Nitrite, UA: NEGATIVE
Urobilinogen, UA: NEGATIVE

## 2012-07-18 LAB — CBC WITH DIFFERENTIAL/PLATELET
Basophils Absolute: 0 10*3/uL (ref 0.0–0.1)
Basophils Relative: 1 % (ref 0–1)
Eosinophils Relative: 3 % (ref 0–5)
HCT: 41.2 % (ref 36.0–46.0)
Hemoglobin: 13.7 g/dL (ref 12.0–15.0)
MCHC: 33.3 g/dL (ref 30.0–36.0)
Monocytes Absolute: 0.9 10*3/uL (ref 0.1–1.0)
Neutro Abs: 2.6 10*3/uL (ref 1.7–7.7)
Platelets: 306 10*3/uL (ref 150–400)
RBC: 4.96 MIL/uL (ref 3.87–5.11)
RDW: 13.9 % (ref 11.5–15.5)

## 2012-07-18 LAB — COMPREHENSIVE METABOLIC PANEL
Albumin: 4.2 g/dL (ref 3.5–5.2)
Alkaline Phosphatase: 70 U/L (ref 39–117)
BUN: 10 mg/dL (ref 6–23)
Glucose, Bld: 81 mg/dL (ref 70–99)
Total Bilirubin: 0.5 mg/dL (ref 0.3–1.2)

## 2012-07-18 LAB — LIPID PANEL
LDL Cholesterol: 113 mg/dL — ABNORMAL HIGH (ref 0–99)
Triglycerides: 85 mg/dL (ref <150)

## 2012-07-18 NOTE — Patient Instructions (Signed)
Check your insurer about coverage for the Shingles vaccine/Zostavax.    Work on the pelvic and upper leg stretching exercises we discussed.   If you continue to have the right pelvic/hip pain, then we can check other causes of the pelvic pain.    Try getting exercise regularly, walking, stationary bike or other exercise that doesn't aggravate your knee pains.   Eat a healthy low fat diet, try and work on some weight loss.   I recommend exercising most days of the week using a type of exercise that they would enjoy and stick to such as walking, running, swimming, hiking, biking, aerobics, etc.  I recommend a healthy diet.    Do's:   whole grains such as whole grain pasta, rice, whole grains breads and whole grain cereals.  Use small quantities such as 1/2 cup per serving or 2 slices of bread per serving.    Eat 3-5 fruits daily  Eat beans at least once daily  Eat almonds in small quantities at least 3 days per week    If they eat meat, I recommend small portions of lean meats such as chicken, fish, and [place].  Eat as much NON corn and NON potato vegetables as they like, particularly raw or steamed  Drink several large glasses of water daily  Cautions:  Limit red meat  Limit corn and potatoes  Limit sweets, cake, pie, candy  Limit beer and alcohol  Avoid fried food, fast food, large portions  Avoid sugary drinks such as regular soda and sweet tea

## 2012-07-18 NOTE — Progress Notes (Addendum)
Subjective: Here for routine follow up.    Lately seems like her feet have been cramping.  No cramping in legs, just bottom of feet.  Worse at night when in bed.  She sits at work mostly, works with [employer].  On computer 6-7 hours daily.  No restless legs, no burning, no numbness, tingling.  She is taking her klor con once daily.  Here for routine f/u on hypertension, taking her medication daily.  No salt added to foods.   Eats somewhat healthy, but on the weekends she tends to eat fast food.  Exercise - 1/2 mile walking daily.  Cut back due to knee issues.   Saw orthopedics back in the fall, diagnosed with arthritis in her knee.  She cut out walking, but still has pulling sensation in her right pelvic area.  Still has her ovaries, but not sure if her ovaries are related to her leg issues.  Seems to be the worse with walking.  sometimes feels it moving a certain way in the bed. No other aggravating or relieving factors.  No fever, weight loss, night sweats.   Allergies  Allergen Reactions  . Sulfonamide Derivatives Hives    Current Outpatient Prescriptions on File Prior to Visit  Medication Sig Dispense Refill  . amLODipine (NORVASC) 10 MG tablet TAKE ONE TABLET BY MOUTH ONCE DAILY  30 tablet  0  . aspirin 81 MG tablet Take 81 mg by mouth daily.        . Calcium Carbonate-Vitamin D (CALCIUM + D PO) Take 1 tablet by mouth daily.        . hydrochlorothiazide (MICROZIDE) 12.5 MG capsule TAKE ONE CAPSULE BY MOUTH EVERY DAY  30 capsule  5  . Omega-3 Fatty Acids (FISH OIL) 1000 MG CPDR Take 1 capsule by mouth daily.        . potassium chloride (KLOR-CON M10) 10 MEQ tablet Take 1 tablet (10 mEq total) by mouth daily.  30 tablet  1   Current Facility-Administered Medications on File Prior to Visit  Medication Dose Route Frequency Provider Last Rate Last Dose  . 0.9 %  sodium chloride infusion  500 mL Intravenous Continuous Mardella Layman, MD        Past Medical History  Diagnosis Date  .  Hypertension   . Hyperlipidemia   . Edema   . Hypokalemia   . Atrophic vaginitis   . Sleep difficulties     sleep study normal, fall 2013  . H/O mammogram 05/03/12    Past Surgical History  Procedure Laterality Date  . Breast lumpectomy      benign  . Colonoscopy  09/2010; 2007    tubular adenoma, Dr. Sheryn Bison  . Abdominal hysterectomy      fribroids    Family History  Problem Relation Age of Onset  . Colon polyps Mother 66    adenomatous polyps,ascending mass  . Hypertension Mother   . Hypertension Father   . Heart disease Neg Hx   . Hyperlipidemia Neg Hx     History   Social History  . Marital Status: Divorced    Spouse Name: N/A    Number of Children: N/A  . Years of Education: N/A   Occupational History  . Not on file.   Social History Main Topics  . Smoking status: Former Games developer  . Smokeless tobacco: Never Used  . Alcohol Use: No  . Drug Use: No  . Sexually Active: Not on file   Other Topics  Concern  . Not on file   Social History Narrative  . No narrative on file    Reviewed their medical, surgical, family, social, medication, and allergy history and updated chart as appropriate.  ROS as in subjective   Objective: Filed Vitals:   07/18/12 0809  BP: 128/82  Pulse: 80  Temp: 98.4 F (36.9 C)  Resp: 16    General appearance: alert, no distress, WD/WN, obese AA female Neck: supple, no lymphadenopathy, no thyromegaly, no masses, no bruits Heart: RRR, normal S1, S2, no murmurs Lungs: CTA bilaterally, no wheezes, rhonchi, or rales Abdomen: +bs, soft, non tender, non distended, no masses, no hepatomegaly, no splenomegaly Back: nontender MSK: right hip nontender, normal ROM, no pain with resisted ROM.  bilat feet without deformity, nontender, normal toe and foot ROM, rest of LE unremarkable Pulses: 2+ symmetric, upper and lower extremities, normal cap refill Neuro: normal LE strength, sensation   Assessment: Encounter Diagnoses  Name  Primary?  . Essential hypertension, benign Yes  . Hyperlipidemia   . Obesity, unspecified   . Need for Tdap vaccination   . Foot cramps   . Hip pain, right     Plan: HTN - controlled on current medication  Hyperlipidemia - 2 heart disease risk factors (hypertension, treated, postmenopausal not on HRT).  [place] 10 year risk of 3%.   C/t current medications, f/u pending labs.    Obesity - work on diet, weight loss, increased exercise  tdap vaccine, VIS and counseling given  Foot cramps - labs today, normal exam  Hip pain, right - history of right hip arthritis.  Ortho notes from late 2013 reviewed.   Discussed pelvic and quadriceps exercises to strength the hip flexors.  If not improving in 1-2 mo recheck.  Ortho mentioned MRI if hip pain persists.  Of note - pelvic exam due 09/2012, she just had mammogram 04/2012.  We discussed zostavax, she will check insurance coverage.  Follow-up pending labs

## 2012-07-19 ENCOUNTER — Other Ambulatory Visit: Payer: Self-pay | Admitting: Medical

## 2012-07-19 LAB — VITAMIN D 25 HYDROXY (VIT D DEFICIENCY, FRACTURES): Vit D, 25-Hydroxy: 23 ng/mL — ABNORMAL LOW (ref 30–89)

## 2012-07-19 MED ORDER — ERGOCALCIFEROL 1.25 MG (50000 UT) PO CAPS: 50000.0000 [IU] | ORAL_CAPSULE | ORAL | Status: DC

## 2012-07-19 MED ORDER — POTASSIUM CHLORIDE CRYS ER 10 MEQ PO TBCR: 10.0000 meq | EXTENDED_RELEASE_TABLET | Freq: Two times a day (BID) | ORAL | Status: DC

## 2012-07-19 MED ORDER — AMLODIPINE BESYLATE 10 MG PO TABS: 10.0000 mg | ORAL_TABLET | Freq: Every day | ORAL | Status: DC

## 2012-07-19 MED ORDER — HYDROCHLOROTHIAZIDE 12.5 MG PO CAPS: 12.5000 mg | ORAL_CAPSULE | ORAL | Status: DC

## 2012-07-25 ENCOUNTER — Ambulatory Visit: Payer: Federal, State, Local not specified - PPO

## 2012-07-25 ENCOUNTER — Other Ambulatory Visit: Payer: Self-pay | Admitting: Medical

## 2012-07-25 MED ORDER — ESTROGENS, CONJUGATED 0.625 MG/GM VA CREA: TOPICAL_CREAM | VAGINAL | Status: DC

## 2012-09-02 ENCOUNTER — Ambulatory Visit: Payer: Federal, State, Local not specified - PPO | Admitting: Medical

## 2012-09-23 ENCOUNTER — Ambulatory Visit (INDEPENDENT_AMBULATORY_CARE_PROVIDER_SITE_OTHER): Payer: Federal, State, Local not specified - PPO | Admitting: Medical

## 2012-09-23 ENCOUNTER — Encounter: Payer: Self-pay | Admitting: Medical

## 2012-09-23 VITALS — BP 130/80 | HR 60 | Temp 97.7°F | Resp 16 | Wt 198.0 lb

## 2012-09-23 DIAGNOSIS — Z23 Encounter for immunization: Secondary | ICD-10-CM

## 2012-09-23 DIAGNOSIS — R609 Edema, unspecified: Secondary | ICD-10-CM

## 2012-09-23 DIAGNOSIS — R350 Frequency of micturition: Secondary | ICD-10-CM

## 2012-09-23 DIAGNOSIS — Z2911 Encounter for prophylactic immunotherapy for respiratory syncytial virus (RSV): Secondary | ICD-10-CM

## 2012-09-23 DIAGNOSIS — E559 Vitamin D deficiency, unspecified: Secondary | ICD-10-CM

## 2012-09-23 DIAGNOSIS — E876 Hypokalemia: Secondary | ICD-10-CM

## 2012-09-23 DIAGNOSIS — M25473 Effusion, unspecified ankle: Secondary | ICD-10-CM

## 2012-09-23 LAB — BASIC METABOLIC PANEL
Calcium: 9.2 mg/dL (ref 8.4–10.5)
Sodium: 142 mEq/L (ref 135–145)

## 2012-09-23 MED ORDER — ESTRADIOL 0.1 MG/GM VA CREA: TOPICAL_CREAM | VAGINAL | Status: DC

## 2012-09-23 NOTE — Progress Notes (Signed)
Subjective: Here for recheck.  C/o some ankle/feet swelling, mild to moderate, worse in evenings.  Does improve some with elevation.  She does walk daily at lunch for exercise.   She has a sedentary job, seated all day.  She has been taking her medications as usual including Vit D prescription weekly and the BID potassium, increased last visit. She also notes some frequent urination, gets up few times every night to urinate.   She does drink fluids at bedtime and throughout the day.  She denies polydipsia, burning with urination, hematuria, odor or pain.  She did check her insurance and they will cover zostavax, wants to update this today.   No other new symptom.     Allergies  Allergen Reactions  . Sulfonamide Derivatives Hives    Current Outpatient Prescriptions on File Prior to Visit  Medication Sig Dispense Refill  . amLODipine (NORVASC) 10 MG tablet Take 1 tablet (10 mg total) by mouth daily.  30 tablet  11  . aspirin 81 MG tablet Take 81 mg by mouth daily.        . Calcium Carbonate-Vitamin D (CALCIUM + D PO) Take 1 tablet by mouth daily.        . ergocalciferol (VITAMIN D2) 50000 UNITS capsule Take 1 capsule (50,000 Units total) by mouth once a week.  4 capsule  3  . hydrochlorothiazide (MICROZIDE) 12.5 MG capsule Take 1 capsule (12.5 mg total) by mouth every morning.  30 capsule  11  . Omega-3 Fatty Acids (FISH OIL) 1000 MG CPDR Take 1 capsule by mouth daily.        . potassium chloride (K-DUR,KLOR-CON) 10 MEQ tablet Take 1 tablet (10 mEq total) by mouth 2 (two) times daily.  60 tablet  5  . conjugated estrogens (PREMARIN) vaginal cream Place vaginally 3 (three) times a week.  42.5 g  5   Current Facility-Administered Medications on File Prior to Visit  Medication Dose Route Frequency Provider Last Rate Last Dose  . 0.9 %  sodium chloride infusion  500 mL Intravenous Continuous Mardella Layman, MD        Past Medical History  Diagnosis Date  . Hypertension   . Hyperlipidemia    . Edema   . Hypokalemia   . Atrophic vaginitis   . Sleep difficulties     sleep study normal, fall 2013  . H/O mammogram 05/03/12    Past Surgical History  Procedure Laterality Date  . Breast lumpectomy      benign  . Colonoscopy  09/2010; 2007    tubular adenoma, Dr. Sheryn Bison  . Abdominal hysterectomy      fribroids    Family History  Problem Relation Age of Onset  . Colon polyps Mother 5    adenomatous polyps,ascending mass  . Hypertension Mother   . Hypertension Father   . Heart disease Neg Hx   . Hyperlipidemia Neg Hx     History   Social History  . Marital Status: Divorced    Spouse Name: N/A    Number of Children: N/A  . Years of Education: N/A   Occupational History  . Not on file.   Social History Main Topics  . Smoking status: Former Games developer  . Smokeless tobacco: Never Used  . Alcohol Use: No  . Drug Use: No  . Sexually Active: Not on file   Other Topics Concern  . Not on file   Social History Narrative  . No narrative on file  Reviewed their medical, surgical, family, social, medication, and allergy history and updated chart as appropriate.  ROS as in subjective   Objective: Filed Vitals:   09/23/12 1029  BP: 130/80  Pulse: 60  Temp: 97.7 F (36.5 C)  Resp: 16    General appearance: alert, no distress, WD/WN Neck: supple, no lymphadenopathy, no thyromegaly, no masses, no bruits, no JVD Heart: RRR, normal S1, S2, no murmurs Lungs: CTA bilaterally, no wheezes, rhonchi, or rales Abdomen: +bs, soft, non tender, non distended, no masses, no hepatomegaly, no splenomegaly Pulses: 2+ symmetric, upper and lower extremities, normal cap refill Ext: mild ankle edema, nonpitting, bilat   Assessment: Encounter Diagnoses  Name Primary?  . Hypokalemia Yes  . Unspecified vitamin D deficiency   . Ankle edema   . Frequent urination   . Need for Zostavax administration     Plan: Discussed causes of LE edema, frequent urination.   Labs today, c/t current medications.  Advised avoid NPO intake within  2 hours of bedtime.  If night time urination c/t, we can consider anticholingeric.  Advised low salt diet, leg elevation, OTC compression hose.  Gave zostavax vaccine, VIS and counseling today.  F/u pending labs.  Will need to return for pelvic/pap in near future.  She is due but declines today.

## 2012-09-23 NOTE — Addendum Note (Signed)
Addended by: Janeice Robinson on: 09/23/2012 01:21 PM   Modules accepted: Orders

## 2012-09-24 LAB — VITAMIN D 25 HYDROXY (VIT D DEFICIENCY, FRACTURES): Vit D, 25-Hydroxy: 26 ng/mL — ABNORMAL LOW (ref 30–89)

## 2012-09-25 ENCOUNTER — Other Ambulatory Visit: Payer: Self-pay | Admitting: Medical

## 2012-09-25 MED ORDER — ERGOCALCIFEROL 1.25 MG (50000 UT) PO CAPS: 50000.0000 [IU] | ORAL_CAPSULE | ORAL | Status: DC

## 2012-09-26 ENCOUNTER — Other Ambulatory Visit: Payer: Self-pay | Admitting: Medical

## 2012-09-26 MED ORDER — HYDROCHLOROTHIAZIDE 25 MG PO TABS: 25.0000 mg | ORAL_TABLET | Freq: Every day | ORAL | Status: DC

## 2012-09-28 ENCOUNTER — Encounter: Payer: Self-pay | Admitting: Family Medicine

## 2012-10-03 ENCOUNTER — Ambulatory Visit: Payer: Federal, State, Local not specified - PPO | Admitting: Medical

## 2012-11-07 ENCOUNTER — Telehealth: Payer: Self-pay | Admitting: Medical

## 2012-11-07 NOTE — Telephone Encounter (Signed)
Due back for recheck on renal function and lytes given the fluid pill, swelling, and Vit D.

## 2012-11-07 NOTE — Telephone Encounter (Signed)
I left a message on the patients voicemail. CLS 

## 2012-11-09 ENCOUNTER — Ambulatory Visit (INDEPENDENT_AMBULATORY_CARE_PROVIDER_SITE_OTHER): Payer: Federal, State, Local not specified - PPO | Admitting: Medical

## 2012-11-09 ENCOUNTER — Encounter: Payer: Self-pay | Admitting: Medical

## 2012-11-09 VITALS — BP 128/82 | HR 92 | Temp 98.2°F | Resp 16 | Wt 195.0 lb

## 2012-11-09 DIAGNOSIS — N3281 Overactive bladder: Secondary | ICD-10-CM

## 2012-11-09 DIAGNOSIS — N318 Other neuromuscular dysfunction of bladder: Secondary | ICD-10-CM

## 2012-11-09 DIAGNOSIS — E559 Vitamin D deficiency, unspecified: Secondary | ICD-10-CM

## 2012-11-09 DIAGNOSIS — I10 Essential (primary) hypertension: Secondary | ICD-10-CM

## 2012-11-09 DIAGNOSIS — R609 Edema, unspecified: Secondary | ICD-10-CM

## 2012-11-09 NOTE — Progress Notes (Signed)
Subjective: Here for recheck on edema.  Feet are still swelling, can't wear her sandals.  This is aggravating for her, despite the increase in HCTZ.  Compliant with norvasc and HCTZ 25mg  daily.  Still has to urinate several times nightly.   No incontinence.  Does not want to pursue medication for this at this time.   Has done 3 mo of prescription Vit D.  No new c/o.  Here for labs as well.  Past Medical History  Diagnosis Date  . Hypertension   . Hyperlipidemia   . Edema   . Hypokalemia   . Atrophic vaginitis   . Sleep difficulties     sleep study normal, fall 2013  . H/O mammogram 05/03/12   Reviewed their medical, surgical, family, social, medication, and allergy history and updated chart as appropriate.  ROS as in subjective   Objective: Filed Vitals:   11/09/12 0811  BP: 128/82  Pulse: 92  Temp: 98.2 F (36.8 C)  Resp: 16    General appearance: alert, no distress, WD/WN Neck: supple, no lymphadenopathy, no thyromegaly, no masses, no JVD Heart: RRR, normal S1, S2, no murmurs Lungs: CTA bilaterally, no wheezes, rhonchi, or rales Pulses: 2+ symmetric, upper and lower extremities, normal cap refill Ext: mild ankle edema, nonpitting, bilat   Assessment: Encounter Diagnoses  Name Primary?  . Essential hypertension, benign Yes  . Unspecified vitamin D deficiency   . Overactive bladder   . Edema      Plan: HTN - back off to Norvasc 5mg , c/t HCTZ 25mg , labs today Vit D deficiency - has finished 38mo prescription vit D.  Labs today.   Overactive bladder  - symptoms diary, f/u in 2wk. Edema - c/t compression hose, low salt diet, exercise.  Lets see if improvement backing off Norvasc.

## 2012-11-10 LAB — BASIC METABOLIC PANEL
Chloride: 103 mEq/L (ref 96–112)
Creat: 0.72 mg/dL (ref 0.50–1.10)
Potassium: 3.6 mEq/L (ref 3.5–5.3)
Sodium: 140 mEq/L (ref 135–145)

## 2012-11-10 LAB — VITAMIN D 25 HYDROXY (VIT D DEFICIENCY, FRACTURES): Vit D, 25-Hydroxy: 35 ng/mL (ref 30–89)

## 2012-12-28 ENCOUNTER — Telehealth: Payer: Self-pay | Admitting: Family Medicine

## 2012-12-28 MED ORDER — HYDROCHLOROTHIAZIDE 25 MG PO TABS: 25.0000 mg | ORAL_TABLET | Freq: Every day | ORAL | Status: DC

## 2012-12-28 NOTE — Telephone Encounter (Signed)
Pt has request HCTZ from [facility] on [address], she states they ususally take a long time to request.  Pt needs refill.

## 2012-12-28 NOTE — Telephone Encounter (Signed)
Medication was sent to the pharmacy. CLS

## 2013-02-21 ENCOUNTER — Ambulatory Visit (INDEPENDENT_AMBULATORY_CARE_PROVIDER_SITE_OTHER): Payer: Federal, State, Local not specified - PPO | Admitting: Family Medicine

## 2013-02-21 ENCOUNTER — Encounter: Payer: Self-pay | Admitting: Family Medicine

## 2013-02-21 VITALS — BP 122/74 | HR 88

## 2013-02-21 DIAGNOSIS — M25561 Pain in right knee: Secondary | ICD-10-CM

## 2013-02-21 DIAGNOSIS — M25569 Pain in unspecified knee: Secondary | ICD-10-CM

## 2013-02-21 DIAGNOSIS — Z23 Encounter for immunization: Secondary | ICD-10-CM

## 2013-02-21 NOTE — Progress Notes (Signed)
  Subjective:    Patient ID: Lisa Hutchinson, female    DOB: [birth year], 62 y.o.   MRN: [id number]  HPI She has a one-week history of difficulty with right knee pain. She notes especially when she gets up from a sitting position she will have knee pain that does tend to get better but does not completely go away when she starts walking. She has been using heat and Advil 4 pills 3 times per week with no relief. She has seen Dr. Darrelyn Hillock for her right knee pain the past. His note does note was reviewed and did show minimal degenerative changes in the right knee.   Review of Systems     Objective:   Physical Exam Slight tenderness palpation to the medial aspect of the joint. Brace testing was equivocal. Anterior drawer negative. Full motion of the knee. No effusion noted.       Assessment & Plan:  Need for prophylactic vaccination and inoculation against influenza - Plan: Flu Vaccine QUAD 36+ mos IM  Right knee pain  flu shot given with risks and benefits discussed. I will have her switch to Aleve 2 pills twice per day to see if this will help. Discussed followup in regard to possible injection/physical therapy/eventual referral and possible MRI. Explained that under the circumstances MRI and surgery is last or malaise. She was comfortable with this.

## 2013-02-21 NOTE — Patient Instructions (Signed)
Take 2 Aleve twice per day for the next 10 days and if still having difficulty coming here and we will discuss giving you an injection

## 2013-02-27 ENCOUNTER — Other Ambulatory Visit: Payer: Self-pay | Admitting: Obstetrics and Gynecology

## 2013-03-07 ENCOUNTER — Ambulatory Visit (INDEPENDENT_AMBULATORY_CARE_PROVIDER_SITE_OTHER): Payer: Federal, State, Local not specified - PPO | Admitting: Family Medicine

## 2013-03-07 ENCOUNTER — Ambulatory Visit: Payer: Federal, State, Local not specified - PPO | Admitting: Family Medicine

## 2013-03-07 ENCOUNTER — Encounter: Payer: Self-pay | Admitting: Family Medicine

## 2013-03-07 VITALS — BP 116/78 | HR 76 | Wt 196.0 lb

## 2013-03-07 DIAGNOSIS — M25561 Pain in right knee: Secondary | ICD-10-CM

## 2013-03-07 DIAGNOSIS — M25569 Pain in unspecified knee: Secondary | ICD-10-CM

## 2013-03-07 MED ORDER — LIDOCAINE HCL 2 % IJ SOLN
3.0000 mL | Freq: Once | INTRAMUSCULAR | Status: AC
  Administered 2013-03-07 (×2): 60 mg via INTRADERMAL

## 2013-03-07 MED ORDER — TRIAMCINOLONE ACETONIDE 40 MG/ML IJ SUSP
40.0000 mg | Freq: Once | INTRAMUSCULAR | Status: AC
  Administered 2013-03-07: 40 mg via INTRAMUSCULAR

## 2013-03-07 NOTE — Progress Notes (Signed)
  Subjective:    Patient ID: Lisa Hutchinson, female    DOB: 11/07/1950, 62 y.o.   MRN: [id number]  HPI She is here for recheck. She has been taking Advil 3 times per day and feels that she is only 20% better. Review of the record indicates she did have a previous x-ray in November of last year which was negative. She would like something more definitive done.   Review of Systems     Objective:   Physical Exam Exam of the right knee does show no swelling with good motion. No crepitus noted. No laxity noted. Negative anterior drawer.       Assessment & Plan:  Right knee pain - Plan: triamcinolone acetonide (KENALOG-40) injection 40 mg, lidocaine (XYLOCAINE) 2 % (with pres) injection 60 mg  I discussed options with her and she is decided to have it injected. The right knee was prepped laterally. 3 cc of Xylocaine and 40 mg of Kenalog was injected into the joint space without difficulty. Within several minutes she obtained 75% relief of her symptoms. She will call if she has difficulties with this. My nurse will discuss the steroid flare with her.

## 2013-03-17 ENCOUNTER — Ambulatory Visit
Admission: RE | Admit: 2013-03-17 | Discharge: 2013-03-17 | Disposition: A | Discharge status: Home / Self Care | Payer: Federal, State, Local not specified - PPO | Source: Ambulatory Visit | Attending: Obstetrics and Gynecology | Admitting: Obstetrics and Gynecology

## 2013-04-12 ENCOUNTER — Other Ambulatory Visit: Payer: Self-pay | Admitting: Family Medicine

## 2013-05-12 ENCOUNTER — Other Ambulatory Visit: Payer: Self-pay | Admitting: Medical

## 2013-06-06 ENCOUNTER — Other Ambulatory Visit: Payer: Self-pay

## 2013-06-06 DIAGNOSIS — Z1231 Encounter for screening mammogram for malignant neoplasm of breast: Secondary | ICD-10-CM

## 2013-06-12 ENCOUNTER — Other Ambulatory Visit: Payer: Self-pay | Admitting: Medical

## 2013-07-05 ENCOUNTER — Ambulatory Visit
Admission: RE | Admit: 2013-07-05 | Discharge: 2013-07-05 | Disposition: A | Discharge status: Home / Self Care | Payer: Federal, State, Local not specified - PPO | Source: Ambulatory Visit

## 2013-07-05 DIAGNOSIS — Z1231 Encounter for screening mammogram for malignant neoplasm of breast: Secondary | ICD-10-CM

## 2013-08-10 ENCOUNTER — Ambulatory Visit (INDEPENDENT_AMBULATORY_CARE_PROVIDER_SITE_OTHER): Payer: Federal, State, Local not specified - PPO | Admitting: Family Medicine

## 2013-08-10 ENCOUNTER — Encounter: Payer: Self-pay | Admitting: Family Medicine

## 2013-08-10 VITALS — BP 110/70 | HR 80 | Wt 194.0 lb

## 2013-08-10 DIAGNOSIS — N952 Postmenopausal atrophic vaginitis: Secondary | ICD-10-CM

## 2013-08-10 DIAGNOSIS — I10 Essential (primary) hypertension: Secondary | ICD-10-CM

## 2013-08-10 DIAGNOSIS — J019 Acute sinusitis, unspecified: Secondary | ICD-10-CM

## 2013-08-10 DIAGNOSIS — Z79899 Other long term (current) drug therapy: Secondary | ICD-10-CM

## 2013-08-10 LAB — CBC WITH DIFFERENTIAL/PLATELET
BASOS ABS: 0.1 10*3/uL (ref 0.0–0.1)
BASOS PCT: 1 % (ref 0–1)
EOS PCT: 7 % — AB (ref 0–5)
Eosinophils Absolute: 0.6 10*3/uL (ref 0.0–0.7)
HCT: 38.9 % (ref 36.0–46.0)
Hemoglobin: 13.4 g/dL (ref 12.0–15.0)
LYMPHS PCT: 29 % (ref 12–46)
Lymphs Abs: 2.7 10*3/uL (ref 0.7–4.0)
MCH: 28.5 pg (ref 26.0–34.0)
MCHC: 34.4 g/dL (ref 30.0–36.0)
MCV: 82.8 fL (ref 78.0–100.0)
MONO ABS: 1.2 10*3/uL — AB (ref 0.1–1.0)
Monocytes Relative: 13 % — ABNORMAL HIGH (ref 3–12)
NEUTROS ABS: 4.6 10*3/uL (ref 1.7–7.7)
Neutrophils Relative %: 50 % (ref 43–77)
PLATELETS: 307 10*3/uL (ref 150–400)
RBC: 4.7 MIL/uL (ref 3.87–5.11)
RDW: 13.6 % (ref 11.5–15.5)
WBC: 9.2 10*3/uL (ref 4.0–10.5)

## 2013-08-10 LAB — COMPREHENSIVE METABOLIC PANEL
ALK PHOS: 72 U/L (ref 39–117)
AST: 19 U/L (ref 0–37)
Albumin: 4 g/dL (ref 3.5–5.2)
BUN: 8 mg/dL (ref 6–23)
CALCIUM: 9.4 mg/dL (ref 8.4–10.5)
CHLORIDE: 101 meq/L (ref 96–112)
CO2: 29 mEq/L (ref 19–32)
CREATININE: 0.72 mg/dL (ref 0.50–1.10)
Glucose, Bld: 136 mg/dL — ABNORMAL HIGH (ref 70–99)
POTASSIUM: 3.1 meq/L — AB (ref 3.5–5.3)
Sodium: 139 mEq/L (ref 135–145)
Total Bilirubin: 0.4 mg/dL (ref 0.2–1.2)
Total Protein: 6.6 g/dL (ref 6.0–8.3)

## 2013-08-10 LAB — LIPID PANEL
CHOL/HDL RATIO: 3.3 ratio
Cholesterol: 166 mg/dL (ref 0–200)
HDL: 50 mg/dL (ref >39)
LDL CALC: 95 mg/dL (ref 0–99)
Triglycerides: 103 mg/dL (ref <150)
VLDL: 21 mg/dL (ref 0–40)

## 2013-08-10 MED ORDER — AMLODIPINE BESYLATE 5 MG PO TABS: 5.0000 mg | ORAL_TABLET | Freq: Every day | ORAL | Status: DC

## 2013-08-10 MED ORDER — HYDROCHLOROTHIAZIDE 12.5 MG PO CAPS: 12.5000 mg | ORAL_CAPSULE | Freq: Every day | ORAL | Status: DC

## 2013-08-10 MED ORDER — AMOXICILLIN 875 MG PO TABS: 875.0000 mg | ORAL_TABLET | Freq: Two times a day (BID) | ORAL | Status: DC

## 2013-08-10 MED ORDER — ESTRADIOL 0.1 MG/GM VA CREA: TOPICAL_CREAM | VAGINAL | Status: DC

## 2013-08-10 NOTE — Progress Notes (Signed)
   Subjective:    Patient ID: Lisa Hutchinson, female    DOB: [birth year], 63 y.o.   MRN: [id number]  HPI  Lisa Hutchinson is a 63 yo female who presents today for a med check.   The patient also has a few other concerns today. The patient has had head congestion for 4-5 days. "All of a sudden" the patient developed yellow discharge, sore throat, headache and a minor cough. The patient denies any earache or fever.    The patient also has pain on the left side of her lower back. She has had this pain for many years and has been told previously that she had degenerative changes in that area. The pain comes and goes and radiates to her whole right hip/flank. The pain is chronic and not worsening at this time.  The patient also have some pain in the right inguinal area. The pain feels like a tightness and occurs when she walks. The pain resolves with continued walking. The pain does not wake the patient from sleep and is not associated with any changes in stool or urine pattern. The patient had this pain evaluated by her OBGYN who did an ultrasound of the area which did not demonstrate any findings.  The patient has begun to walk more recently and has also begun taking swim classes.   Would also like to have her medications readjusted to be able to take less medication.  Review of Systems is negative except per HPI.     Objective:   Physical Exam  Constitutional: Patient is oriented to person, place, and time and well-developed, well-nourished, and in no distress. HENT: Head: Normocephalic and atraumatic.  Mouth/Throat: Oropharynx is clear and moist. No swelling or exudates Nose: Nasal mucosa was slightly boggy and erythematous. Eyes: Conjunctivae and EOM are normal. Pupils are equal, round, and reactive to light.  Cardiovascular: Normal rate, regular rhythm. Exam reveals no murmurs, gallops and no friction rub.  Pulmonary/Chest: Effort normal and breath sounds normal.  Abdominal: Soft,  non-tender and non-distended. There is no rebound and no guarding. Pain is illicited with palpation of the right inguinal crease.  MSK: Palpation of the left SI joint causes sharp pain. Negative Straight leg raise. Full ROM in the left hip.     Assessment & Plan:   Hypertension - Plan: amLODipine (NORVASC) 5 MG tablet, hydrochlorothiazide (MICROZIDE) 12.5 MG capsule, CBC with Differential, Comprehensive metabolic panel  Acute sinusitis - Plan: amoxicillin (AMOXIL) 875 MG tablet  Perimenopausal atrophic vaginitis - Plan: estradiol (ESTRACE VAGINAL) 0.1 MG/GM vaginal cream  Encounter for long-term (current) use of other medications - Plan: CBC with Differential, Comprehensive metabolic panel, Lipid panel  1) Medication management: Will discontinue the patient's K-CLOR, and cut her amlodipine to 5mg  and decrease her HCTZ to 12.5mg . Will follow up in 1 month regarding BP  2) Patient should apply heat to the area for 20 mins and then do the demonstrated stretching techniques. The patient should also continue her efforts at walking and swimming, this will help as well.  Her medications were readjusted. I will not give her Norvasc 5 mg as well as 12-1/2 of HCTZ. She can stop her potassium supplementation as well as omega-3.

## 2013-08-10 NOTE — Patient Instructions (Signed)
Use Afrin only at  Night. Due to the heat and stretching as I demonstrated to you for your back. Come back here in one month and we will recheck your blood pressure and potassium. Stay on your multivitamin.

## 2013-08-10 NOTE — Progress Notes (Deleted)
   Subjective:    Patient ID: Lisa Hutchinson, female    DOB: 12/31/1950, 63 y.o.   MRN: [id number]  HPI  Lisa Hutchinson is a 63 yo female who presents today for a med check.   The patient also has a few other concerns today. The patient has had head congestion for 4-5 days. "All of a sudden" the patient developed yellow discharge, sore throat, headache and a minor cough. The patient denies any earache or fever.    The patient also has pain on the left side of her lower back. She has had this pain for many years and has been told previously that she had degenerative changes in that area. The pain comes and goes and radiates to her whole right hip/flank. The pain is chronic and not worsening at this time.  The patient also have some pain in the right inguinal area. The pain feels like a tightness and occurs when she walks. The pain resolves with continued walking. The pain does not wake the patient from sleep and is not associated with any changes in stool or urine pattern. The patient had this pain evaluated by her OBGYN who did an ultrasound of the area which did not demonstrate any findings.  The patient has begun to walk more recently and has also begun taking swim classes.     Review of Systems is negative except per HPI.     Objective:   Physical Exam  Constitutional: Patient is oriented to person, place, and time and well-developed, well-nourished, and in no distress. HENT: Head: Normocephalic and atraumatic.  Mouth/Throat: Oropharynx is clear and moist. No swelling or exudates Nose: Nasal mucosa was slightly boggy and erythematous. Eyes: Conjunctivae and EOM are normal. Pupils are equal, round, and reactive to light.  Cardiovascular: Normal rate, regular rhythm. Exam reveals no murmurs, gallops and no friction rub.  Pulmonary/Chest: Effort normal and breath sounds normal.  Abdominal: Soft, non-tender and non-distended. There is no rebound and no guarding. Pain is illicited  with palpation of the right inguinal crease.  MSK: Palpation of the left SI joint causes sharp pain. Negative Staright leg raise. Full ROM in the left hip.     Assessment & Plan:   Hypertension - Plan: amLODipine (NORVASC) 5 MG tablet, hydrochlorothiazide (MICROZIDE) 12.5 MG capsule, CBC with Differential, Comprehensive metabolic panel  Acute sinusitis - Plan: amoxicillin (AMOXIL) 875 MG tablet  Perimenopausal atrophic vaginitis - Plan: estradiol (ESTRACE VAGINAL) 0.1 MG/GM vaginal cream  Encounter for long-term (current) use of other medications - Plan: CBC with Differential, Comprehensive metabolic panel, Lipid panel  1) Medication management: Will discontinue the patient's K-CLOR, and cut her amlodipine to 5mg  and decrease her HCTZ to 12.5mg . Will follow up in 1 month regarding BP  2) Patient should apply heat to the area for 20 mins and then do the demonstrated stretching techniques. The patient should also continue her efforts at walking and swimming, this will help as well.

## 2013-09-04 ENCOUNTER — Encounter: Payer: Self-pay | Admitting: Family Medicine

## 2013-09-04 ENCOUNTER — Ambulatory Visit (INDEPENDENT_AMBULATORY_CARE_PROVIDER_SITE_OTHER): Payer: Federal, State, Local not specified - PPO | Admitting: Family Medicine

## 2013-09-04 VITALS — BP 122/72 | HR 68 | Wt 194.0 lb

## 2013-09-04 DIAGNOSIS — E669 Obesity, unspecified: Secondary | ICD-10-CM

## 2013-09-04 DIAGNOSIS — I10 Essential (primary) hypertension: Secondary | ICD-10-CM

## 2013-09-04 NOTE — Patient Instructions (Addendum)
cutt back on carbs. Get back involved in [employer]

## 2013-09-04 NOTE — Progress Notes (Signed)
   Subjective:    Patient ID: Lisa Hutchinson, female    DOB: June 17, 1950, 63 y.o.   MRN: [id number]  HPI She is here for recheck. On her last visit blood pressure medications were readjusted downward. She seems to be doing well with this. She does have concerns over her weight. In the past she has been involved with [employer] but stopped going.   Review of Systems     Objective:   Physical Exam Alert and in no distress otherwise not examined. Blood pressure is great.       Assessment & Plan:  Hypertension  Obesity (BMI 30-39.9)  continue present medication regimen. Discussed getting back involved in [employer] which I strongly suggested.

## 2013-09-19 ENCOUNTER — Ambulatory Visit: Payer: Self-pay | Admitting: Family Medicine

## 2013-11-06 ENCOUNTER — Encounter: Payer: Self-pay | Admitting: Family Medicine

## 2013-11-06 ENCOUNTER — Ambulatory Visit (INDEPENDENT_AMBULATORY_CARE_PROVIDER_SITE_OTHER): Payer: Federal, State, Local not specified - PPO | Admitting: Family Medicine

## 2013-11-06 VITALS — BP 130/82 | HR 79 | Ht 63.0 in | Wt 189.0 lb

## 2013-11-06 DIAGNOSIS — I10 Essential (primary) hypertension: Secondary | ICD-10-CM

## 2013-11-06 DIAGNOSIS — Z83719 Family history of colon polyps, unspecified: Secondary | ICD-10-CM

## 2013-11-06 DIAGNOSIS — Z0000 Encounter for general adult medical examination without abnormal findings: Secondary | ICD-10-CM

## 2013-11-06 DIAGNOSIS — Z8371 Family history of colonic polyps: Secondary | ICD-10-CM

## 2013-11-06 LAB — POCT GLYCOSYLATED HEMOGLOBIN (HGB A1C): Hemoglobin A1C: 5.4

## 2013-11-06 NOTE — Progress Notes (Signed)
   Subjective:    Patient ID: Lisa Hutchinson, female    DOB: 07/23/1950, 63 y.o.   MRN: [id number]  HPI She is here for a complete examination. She did have blood work in April. It did show slightly elevated blood sugar. Her main complaint today is left shoulder heaviness as well as left thumb and index finger discomfort for the last 2 weeks. No history of injury. No particular thing that makes this better or worse. She otherwise has no concerns or complaints. She recently was seen by her gynecologist. She is now retired. Family and social history reviewed. Immunizations and health maintenance were also reviewed. Presently she is up-to-date on all of these issues.    Review of Systems  All other systems reviewed and are negative.      Objective:   Physical Exam alert and in no distress. Tympanic membranes and canals are normal. Throat is clear. Tonsils are normal. Neck is supple without adenopathy or thyromegaly. Cardiac exam shows a regular sinus rhythm without murmurs or gallops. Lungs are clear to auscultation. Abdominal exam shows no masses or tenderness with normal bowel sounds. Hemoglobin A1c is 5.4. Exam of the left shoulder shows full motion with normal strength and sensation. Left hand exam shows negative Homans and canals. Normal strength and sensation.       Assessment & Plan:  Essential hypertension  Family history of colonic polyps  Routine general medical examination at a health care facility Explained that I did not find anything with shoulder and the wrist. Recommend she attention to anything that makes is better or worse and return here at that time.

## 2013-11-06 NOTE — Patient Instructions (Signed)
Pay attention to the shoulder and the wrist in terms of anything that makes it better or worse and then let me know.

## 2014-01-24 ENCOUNTER — Other Ambulatory Visit (INDEPENDENT_AMBULATORY_CARE_PROVIDER_SITE_OTHER): Payer: Federal, State, Local not specified - PPO

## 2014-01-24 DIAGNOSIS — Z23 Encounter for immunization: Secondary | ICD-10-CM

## 2014-05-31 ENCOUNTER — Other Ambulatory Visit: Payer: Self-pay

## 2014-05-31 DIAGNOSIS — Z1231 Encounter for screening mammogram for malignant neoplasm of breast: Secondary | ICD-10-CM

## 2014-07-11 ENCOUNTER — Ambulatory Visit: Payer: Federal, State, Local not specified - PPO

## 2014-07-12 ENCOUNTER — Ambulatory Visit
Admission: RE | Admit: 2014-07-12 | Discharge: 2014-07-12 | Disposition: A | Discharge status: Home / Self Care | Payer: Federal, State, Local not specified - PPO | Source: Ambulatory Visit

## 2014-07-12 DIAGNOSIS — Z1231 Encounter for screening mammogram for malignant neoplasm of breast: Secondary | ICD-10-CM

## 2014-08-06 ENCOUNTER — Encounter: Payer: Self-pay | Admitting: Family Medicine

## 2014-08-06 ENCOUNTER — Ambulatory Visit (INDEPENDENT_AMBULATORY_CARE_PROVIDER_SITE_OTHER): Payer: Federal, State, Local not specified - PPO | Admitting: Family Medicine

## 2014-08-06 VITALS — BP 124/82 | HR 80 | Ht 63.0 in | Wt 198.0 lb

## 2014-08-06 DIAGNOSIS — M461 Sacroiliitis, not elsewhere classified: Secondary | ICD-10-CM

## 2014-08-06 DIAGNOSIS — N952 Postmenopausal atrophic vaginitis: Secondary | ICD-10-CM | POA: Diagnosis not present

## 2014-08-06 DIAGNOSIS — I10 Essential (primary) hypertension: Secondary | ICD-10-CM

## 2014-08-06 DIAGNOSIS — M1990 Unspecified osteoarthritis, unspecified site: Secondary | ICD-10-CM | POA: Diagnosis not present

## 2014-08-06 DIAGNOSIS — Z8371 Family history of colonic polyps: Secondary | ICD-10-CM | POA: Diagnosis not present

## 2014-08-06 DIAGNOSIS — Z79899 Other long term (current) drug therapy: Secondary | ICD-10-CM

## 2014-08-06 MED ORDER — ESTRADIOL 0.1 MG/GM VA CREA: TOPICAL_CREAM | VAGINAL | Status: DC

## 2014-08-06 MED ORDER — AMLODIPINE BESYLATE 5 MG PO TABS: 5.0000 mg | ORAL_TABLET | Freq: Every day | ORAL | Status: DC

## 2014-08-06 MED ORDER — HYDROCHLOROTHIAZIDE 12.5 MG PO CAPS: 12.5000 mg | ORAL_CAPSULE | Freq: Every day | ORAL | Status: DC

## 2014-08-06 NOTE — Patient Instructions (Addendum)
Do the heat for 20 minutes then stretching 2 or 3 times per day 150 minutes a week of something physical. Cut back on carbohydrates. "White food"

## 2014-08-06 NOTE — Progress Notes (Signed)
   Subjective:    Patient ID: Lisa Hutchinson, female    DOB: 1950/10/18, 64 y.o.   MRN: [id number]  HPI She is here for a medication check. Intent is on her blood pressure medications and is having no difficulty with that. She does complain of some left sided back and hip pain. This has bothered her for well over a month or so. Continues on Estrace. She does have questions concerning this. There is a family history of colonic polyps. She has had previous right hip x-ray which did show arthritic changesShe is up-to-date on her immunizations as well as general health maintenance. No chest pain, shortness breath, GI symptomsSmoking and drinking were reviewed. 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   Review of Systems  All other systems reviewed and are negative.      Objective:   Physical Exam Alert and in no distress. Tympanic membranes and canals are normal. Pharyngeal area is normal. Neck is supple without adenopathy or thyromegaly. Cardiac exam shows a regular sinus rhythm without murmurs or gallops. Lungs are clear to auscultation. Normal hip motion. Tender to palpation over left SI joint with rocking testing being positive. Negative straight leg raising.       Assessment & Plan:  Essential hypertension - Plan: amLODipine (NORVASC) 5 MG tablet, hydrochlorothiazide (MICROZIDE) 12.5 MG capsule  Family history of colonic polyps  Arthritis - Plan: CBC with Differential/Platelet, Comprehensive metabolic panel  Sacroiliitis  Atrophic vaginitis - Plan: estradiol (ESTRACE VAGINAL) 0.1 MG/GM vaginal cream  Encounter for long-term (current) use of medications - Plan: CBC with Differential/Platelet, Comprehensive metabolic panel, Lipid panel I answered questions concerning the vaginal cream and systemic effects. She is comfortable with that. She will continue on present medication regimen. Encouraged her to become more physically active and cut back on  carbohydrates. Discussed proper care for her sacroiliitis including heat and stretching exercises. She will call if continued difficulty.

## 2014-08-07 LAB — LIPID PANEL
Cholesterol: 189 mg/dL (ref 0–200)
HDL: 55 mg/dL (ref >=46)
LDL CALC: 115 mg/dL — AB (ref 0–99)
TRIGLYCERIDES: 96 mg/dL (ref <150)
Total CHOL/HDL Ratio: 3.4 Ratio
VLDL: 19 mg/dL (ref 0–40)

## 2014-08-07 LAB — COMPREHENSIVE METABOLIC PANEL
ALT: 11 U/L (ref 0–35)
AST: 23 U/L (ref 0–37)
Albumin: 4.1 g/dL (ref 3.5–5.2)
Alkaline Phosphatase: 74 U/L (ref 39–117)
BUN: 10 mg/dL (ref 6–23)
CALCIUM: 9.9 mg/dL (ref 8.4–10.5)
CO2: 26 meq/L (ref 19–32)
Chloride: 101 mEq/L (ref 96–112)
Creat: 0.64 mg/dL (ref 0.50–1.10)
GLUCOSE: 73 mg/dL (ref 70–99)
Potassium: 3.6 mEq/L (ref 3.5–5.3)
Sodium: 138 mEq/L (ref 135–145)
TOTAL PROTEIN: 6.6 g/dL (ref 6.0–8.3)
Total Bilirubin: 0.5 mg/dL (ref 0.2–1.2)

## 2014-08-07 LAB — CBC WITH DIFFERENTIAL/PLATELET
BASOS ABS: 0 10*3/uL (ref 0.0–0.1)
BASOS PCT: 0 % (ref 0–1)
Eosinophils Absolute: 0.3 10*3/uL (ref 0.0–0.7)
Eosinophils Relative: 4 % (ref 0–5)
HEMATOCRIT: 42.6 % (ref 36.0–46.0)
Hemoglobin: 14.3 g/dL (ref 12.0–15.0)
LYMPHS PCT: 40 % (ref 12–46)
Lymphs Abs: 3 10*3/uL (ref 0.7–4.0)
MCH: 28.7 pg (ref 26.0–34.0)
MCHC: 33.6 g/dL (ref 30.0–36.0)
MCV: 85.4 fL (ref 78.0–100.0)
MPV: 10.3 fL (ref 8.6–12.4)
Monocytes Absolute: 0.7 10*3/uL (ref 0.1–1.0)
Monocytes Relative: 9 % (ref 3–12)
NEUTROS ABS: 3.5 10*3/uL (ref 1.7–7.7)
Neutrophils Relative %: 47 % (ref 43–77)
PLATELETS: 290 10*3/uL (ref 150–400)
RBC: 4.99 MIL/uL (ref 3.87–5.11)
RDW: 13.9 % (ref 11.5–15.5)
WBC: 7.5 10*3/uL (ref 4.0–10.5)

## 2014-08-10 ENCOUNTER — Telehealth: Payer: Self-pay | Admitting: Family Medicine

## 2014-08-10 NOTE — Telephone Encounter (Signed)
Requesting a call from [place] because pt has some general questions about lab results. She is not understanding the coronary heart comments as well as a couple of other questions. Pt can be reached at her home or cell #'s on Monday

## 2014-08-13 NOTE — Telephone Encounter (Signed)
Patient informed of labs and verbalized understanding. 

## 2015-06-03 ENCOUNTER — Other Ambulatory Visit: Payer: Self-pay

## 2015-06-03 DIAGNOSIS — Z1231 Encounter for screening mammogram for malignant neoplasm of breast: Secondary | ICD-10-CM

## 2015-07-15 ENCOUNTER — Ambulatory Visit (INDEPENDENT_AMBULATORY_CARE_PROVIDER_SITE_OTHER): Payer: Federal, State, Local not specified - PPO | Admitting: Family Medicine

## 2015-07-15 ENCOUNTER — Ambulatory Visit
Admission: RE | Admit: 2015-07-15 | Discharge: 2015-07-15 | Disposition: A | Discharge status: Home / Self Care | Payer: Federal, State, Local not specified - PPO | Source: Ambulatory Visit

## 2015-07-15 ENCOUNTER — Encounter: Payer: Self-pay | Admitting: Family Medicine

## 2015-07-15 VITALS — BP 138/98 | HR 64 | Wt 190.0 lb

## 2015-07-15 DIAGNOSIS — Z1231 Encounter for screening mammogram for malignant neoplasm of breast: Secondary | ICD-10-CM

## 2015-07-15 DIAGNOSIS — Z8371 Family history of colonic polyps: Secondary | ICD-10-CM

## 2015-07-15 DIAGNOSIS — Z0000 Encounter for general adult medical examination without abnormal findings: Secondary | ICD-10-CM | POA: Diagnosis not present

## 2015-07-15 DIAGNOSIS — Z86010 Personal history of colonic polyps: Secondary | ICD-10-CM | POA: Diagnosis not present

## 2015-07-15 DIAGNOSIS — I10 Essential (primary) hypertension: Secondary | ICD-10-CM | POA: Diagnosis not present

## 2015-07-15 DIAGNOSIS — N952 Postmenopausal atrophic vaginitis: Secondary | ICD-10-CM | POA: Diagnosis not present

## 2015-07-15 DIAGNOSIS — M1990 Unspecified osteoarthritis, unspecified site: Secondary | ICD-10-CM | POA: Insufficient documentation

## 2015-07-15 DIAGNOSIS — Z1159 Encounter for screening for other viral diseases: Secondary | ICD-10-CM

## 2015-07-15 DIAGNOSIS — E669 Obesity, unspecified: Secondary | ICD-10-CM | POA: Diagnosis not present

## 2015-07-15 LAB — POCT URINALYSIS DIPSTICK
Bilirubin, UA: NEGATIVE
Glucose, UA: NEGATIVE
KETONES UA: NEGATIVE
Leukocytes, UA: NEGATIVE
Nitrite, UA: NEGATIVE
PH UA: 6
Protein, UA: NEGATIVE
RBC UA: NEGATIVE
Spec Grav, UA: 1.015
Urobilinogen, UA: NEGATIVE

## 2015-07-15 LAB — COMPREHENSIVE METABOLIC PANEL
ALK PHOS: 73 U/L (ref 33–130)
ALT: 10 U/L (ref 6–29)
AST: 23 U/L (ref 10–35)
Albumin: 4 g/dL (ref 3.6–5.1)
BILIRUBIN TOTAL: 0.4 mg/dL (ref 0.2–1.2)
BUN: 11 mg/dL (ref 7–25)
CHLORIDE: 101 mmol/L (ref 98–110)
CO2: 25 mmol/L (ref 20–31)
Calcium: 9.9 mg/dL (ref 8.6–10.4)
Creat: 0.71 mg/dL (ref 0.50–0.99)
Glucose, Bld: 80 mg/dL (ref 65–99)
POTASSIUM: 3.7 mmol/L (ref 3.5–5.3)
Sodium: 138 mmol/L (ref 135–146)
TOTAL PROTEIN: 6.8 g/dL (ref 6.1–8.1)

## 2015-07-15 LAB — LIPID PANEL
Cholesterol: 202 mg/dL — ABNORMAL HIGH (ref 125–200)
HDL: 59 mg/dL (ref >=46)
LDL Cholesterol: 125 mg/dL (ref <130)
Total CHOL/HDL Ratio: 3.4 Ratio (ref <=5.0)
Triglycerides: 92 mg/dL (ref <150)
VLDL: 18 mg/dL (ref <30)

## 2015-07-15 LAB — CBC WITH DIFFERENTIAL/PLATELET
Basophils Absolute: 0.1 10*3/uL (ref 0.0–0.1)
Basophils Relative: 1 % (ref 0–1)
EOS ABS: 0.3 10*3/uL (ref 0.0–0.7)
Eosinophils Relative: 5 % (ref 0–5)
HCT: 42.7 % (ref 36.0–46.0)
Hemoglobin: 14 g/dL (ref 12.0–15.0)
Lymphocytes Relative: 45 % (ref 12–46)
Lymphs Abs: 2.7 10*3/uL (ref 0.7–4.0)
MCH: 28.1 pg (ref 26.0–34.0)
MCHC: 32.8 g/dL (ref 30.0–36.0)
MCV: 85.7 fL (ref 78.0–100.0)
MONOS PCT: 11 % (ref 3–12)
MPV: 10.4 fL (ref 8.6–12.4)
Monocytes Absolute: 0.6 10*3/uL (ref 0.1–1.0)
Neutro Abs: 2.2 10*3/uL (ref 1.7–7.7)
Neutrophils Relative %: 38 % — ABNORMAL LOW (ref 43–77)
PLATELETS: 275 10*3/uL (ref 150–400)
RBC: 4.98 MIL/uL (ref 3.87–5.11)
RDW: 13.6 % (ref 11.5–15.5)
WBC: 5.9 10*3/uL (ref 4.0–10.5)

## 2015-07-15 MED ORDER — AMLODIPINE BESYLATE 5 MG PO TABS: 5.0000 mg | ORAL_TABLET | Freq: Every day | ORAL | Status: DC

## 2015-07-15 MED ORDER — ESTRADIOL 0.1 MG/GM VA CREA: TOPICAL_CREAM | VAGINAL | Status: DC

## 2015-07-15 MED ORDER — HYDROCHLOROTHIAZIDE 12.5 MG PO CAPS
12.5000 mg | ORAL_CAPSULE | Freq: Every day | ORAL | Status: DC
Start: 2015-07-15 — End: 2015-07-23

## 2015-07-15 NOTE — Progress Notes (Signed)
Subjective:    Patient ID: Lisa Hutchinson, female    DOB: [birth year], 65 y.o.   MRN: [id number]  HPI She is here for complete examination. She does have underlying hypertension and continues on her present medication regimen and having no difficulty with this. She also uses Estrace vaginal cream to help with her atrophic vaginitis and again states this is helping. She does complain of right hip pain especially with physical activities. She has seen her gynecologist and apparently there has been no pelvic issues causing this. She does have a history of adenomatous polyps as well as a family history of polyps.she has lost approximately 9 or 10 pounds stating she has made some dietary changes. She has no other concerns or complaints. Family and social history as well as health maintenance and immunizations were reviewed. She has had no difficulty with allergy symptoms, chest pain or GI issues.   Review of Systems  All other systems reviewed and are negative.      Objective:   Physical Exam BP 138/98 mmHg  Pulse 64  Wt 190 lb (86.183 kg)  General Appearance:    Alert, cooperative, no distress, appears stated age  Head:    Normocephalic, without obvious abnormality, atraumatic  Eyes:    PERRL, conjunctiva/corneas clear, EOM's intact, fundi    benign  Ears:    Normal TM's and external ear canals  Nose:   Nares normal, mucosa normal, no drainage or sinus   tenderness  Throat:   Lips, mucosa, and tongue normal; teeth and gums normal  Neck:   Supple, no lymphadenopathy;  thyroid:  no   enlargement/tenderness/nodules; no carotid   bruit or JVD  Back:    Spine nontender, no curvature, ROM normal, no CVA     tenderness  Lungs:     Clear to auscultation bilaterally without wheezes, rales or     ronchi; respirations unlabored  Chest Wall:    No tenderness or deformity   Heart:    Regular rate and rhythm, S1 and S2 normal, no murmur, rub   or gallop  Breast Exam:    Deferred to GYN  Abdomen:      Soft, non-tender, nondistended, normoactive bowel sounds,    no masses, no hepatosplenomegaly  Genitalia:    Deferred to GYN     Extremities:   No clubbing, cyanosis or edema.exam of the right hip shows some discomfort with internal and external rotation as well as with flexion. Negative straight leg raising.  Pulses:   2+ and symmetric all extremities  Skin:   Skin color, texture, turgor normal, no rashes or lesions  Lymph nodes:   Cervical, supraclavicular, and axillary nodes normal  Neurologic:   CNII-XII intact, normal strength, sensation and gait; reflexes 2+ and symmetric throughout          Psych:   Normal mood, affect, hygiene and grooming.          Assessment & Plan:  Routine general medical examination at a health care facility - Plan: CBC with Differential/Platelet, Comprehensive metabolic panel, Lipid panel  Essential hypertension - Plan: Urinalysis Dipstick, amLODipine (NORVASC) 5 MG tablet, hydrochlorothiazide (MICROZIDE) 12.5 MG capsule, CBC with Differential/Platelet, Comprehensive metabolic panel  Atrophic vaginitis - Plan: estradiol (ESTRACE VAGINAL) 0.1 MG/GM vaginal cream  Family history of colonic polyps  Arthritis - Plan: DG HIP UNILAT WITH PELVIS 1V RIGHT  Hx of adenomatous colonic polyps - Plan: Ambulatory referral to Gastroenterology  Need for hepatitis C screening test - Plan:  Hepatitis C antibody her last colonoscopy was in 2011 and did show adenomatous polyps. I will follow up on the x-ray after she gets it. She is continuing all of her other medications. Encouraged her to make further diet and exercise changes. Recommended 20 minutes per day of something physical.

## 2015-07-16 ENCOUNTER — Ambulatory Visit
Admission: RE | Admit: 2015-07-16 | Discharge: 2015-07-16 | Disposition: A | Discharge status: Home / Self Care | Payer: Federal, State, Local not specified - PPO | Source: Ambulatory Visit | Attending: Family Medicine | Admitting: Family Medicine

## 2015-07-16 ENCOUNTER — Other Ambulatory Visit: Payer: Self-pay | Admitting: Family Medicine

## 2015-07-16 DIAGNOSIS — M1990 Unspecified osteoarthritis, unspecified site: Secondary | ICD-10-CM

## 2015-07-16 LAB — HEPATITIS C ANTIBODY: HCV Ab: NEGATIVE

## 2015-07-22 ENCOUNTER — Other Ambulatory Visit: Payer: Federal, State, Local not specified - PPO

## 2015-07-23 ENCOUNTER — Ambulatory Visit (INDEPENDENT_AMBULATORY_CARE_PROVIDER_SITE_OTHER): Payer: Federal, State, Local not specified - PPO | Admitting: Family Medicine

## 2015-07-23 ENCOUNTER — Encounter: Payer: Self-pay | Admitting: Family Medicine

## 2015-07-23 VITALS — BP 142/88 | HR 86 | Resp 14 | Ht 63.0 in | Wt 192.8 lb

## 2015-07-23 DIAGNOSIS — T464X5A Adverse effect of angiotensin-converting-enzyme inhibitors, initial encounter: Principal | ICD-10-CM

## 2015-07-23 DIAGNOSIS — R05 Cough: Secondary | ICD-10-CM | POA: Diagnosis not present

## 2015-07-23 DIAGNOSIS — I10 Essential (primary) hypertension: Secondary | ICD-10-CM

## 2015-07-23 MED ORDER — LOSARTAN POTASSIUM-HCTZ 50-12.5 MG PO TABS: 1.0000 | ORAL_TABLET | Freq: Every day | ORAL | Status: DC

## 2015-07-23 NOTE — Progress Notes (Signed)
   Subjective:    Patient ID: Lisa Hutchinson, female    DOB: Aug 23, 1950, 65 y.o.   MRN: [id number]  HPI She is here for recheck on her blood pressure. She has had borderline blood pressures on her present medication regimen. She does have a history of ACE cough.   Review of Systems     Objective:   Physical Exam Alert and in no distress. Blood pressure is recorded.      Assessment & Plan:  ACE-inhibitor cough  Essential hypertension - Plan: losartan-hydrochlorothiazide (HYZAAR) 50-12.5 MG tablet switch to Hyzaar and recheck blood pressure in approximately 1 month. She is to bring her blood pressure cuff in to compare it.

## 2015-08-02 ENCOUNTER — Encounter: Payer: Self-pay | Admitting: Internal Medicine

## 2015-08-26 ENCOUNTER — Ambulatory Visit (INDEPENDENT_AMBULATORY_CARE_PROVIDER_SITE_OTHER): Payer: Federal, State, Local not specified - PPO | Admitting: Family Medicine

## 2015-08-26 ENCOUNTER — Encounter: Payer: Self-pay | Admitting: Family Medicine

## 2015-08-26 VITALS — BP 126/90 | HR 76 | Ht 63.0 in | Wt 190.2 lb

## 2015-08-26 DIAGNOSIS — I10 Essential (primary) hypertension: Secondary | ICD-10-CM | POA: Diagnosis not present

## 2015-08-26 DIAGNOSIS — E669 Obesity, unspecified: Secondary | ICD-10-CM | POA: Diagnosis not present

## 2015-08-26 NOTE — Progress Notes (Signed)
   Subjective:    Patient ID: Lisa Hutchinson, female    DOB: 14-Oct-1950, 65 y.o.   MRN: [id number]  HPI She is back for a recheck on her blood pressure. Her machine reads roughly 5 points higher on both systolic and diastolic from our's. She states she is exercising regularly with walking half an hour. She does not do this in the wintertime however.   Review of Systems     Objective:   Physical Exam Alert and in no distress. Blood pressure is recorded       Assessment & Plan:  Essential hypertension  Obesity (BMI 30-39.9)  Continue on present medication regimen. Encourage her to walk on a daily basis. Also discussed weight reduction especially regarding carbohydrates.

## 2015-09-25 ENCOUNTER — Ambulatory Visit (AMBULATORY_SURGERY_CENTER): Payer: Self-pay | Admitting: *Deleted

## 2015-09-25 VITALS — Ht 64.0 in | Wt 192.0 lb

## 2015-09-25 DIAGNOSIS — Z86010 Personal history of colonic polyps: Secondary | ICD-10-CM

## 2015-09-25 MED ORDER — NA SULFATE-K SULFATE-MG SULF 17.5-3.13-1.6 GM/177ML PO SOLN: 1.0000 | Freq: Once | ORAL | Status: DC

## 2015-09-25 NOTE — Progress Notes (Signed)
No egg or soy allergy known to patient  No issues with past sedation with any surgeries  or procedures, no intubation problems  No diet pills per patient No home 02 use per patient  No blood thinners per patient  Pt denies issues with constipation  emmi declined

## 2015-10-01 ENCOUNTER — Encounter: Payer: Self-pay | Admitting: Internal Medicine

## 2015-10-09 ENCOUNTER — Encounter: Payer: Self-pay | Admitting: Internal Medicine

## 2015-10-09 ENCOUNTER — Ambulatory Visit (AMBULATORY_SURGERY_CENTER): Payer: Federal, State, Local not specified - PPO | Admitting: Internal Medicine

## 2015-10-09 VITALS — BP 115/82 | HR 54 | Temp 96.2°F | Resp 14 | Ht 63.0 in | Wt 190.0 lb

## 2015-10-09 DIAGNOSIS — Z86010 Personal history of colonic polyps: Secondary | ICD-10-CM | POA: Diagnosis present

## 2015-10-09 DIAGNOSIS — D123 Benign neoplasm of transverse colon: Secondary | ICD-10-CM | POA: Diagnosis not present

## 2015-10-09 MED ORDER — SODIUM CHLORIDE 0.9 % IV SOLN: 500.0000 mL | INTRAVENOUS | Status: DC

## 2015-10-09 NOTE — Op Note (Signed)
[facility] Patient Name: Lisa Hutchinson Procedure Date: 10/09/2015 11:58 AM MRN: [id number] Endoscopist: Jerene Bears , MD Age: 65 Referring MD:  Date of Birth: 04-25-1951 Gender: Female Procedure:                Colonoscopy Indications:              Surveillance: Personal history of adenomatous                            polyps on last colonoscopy 5 years ago Medicines:                Monitored Anesthesia Care Procedure:                Pre-Anesthesia Assessment:                           - Prior to the procedure, a History and Physical                            was performed, and patient medications and                            allergies were reviewed. The patient's tolerance of                            previous anesthesia was also reviewed. The risks                            and benefits of the procedure and the sedation                            options and risks were discussed with the patient.                            All questions were answered, and informed consent                            was obtained. Prior Anticoagulants: The patient has                            taken no previous anticoagulant or antiplatelet                            agents. ASA Grade Assessment: II - A patient with                            mild systemic disease. After reviewing the risks                            and benefits, the patient was deemed in                            satisfactory condition to undergo the procedure.  After obtaining informed consent, the colonoscope                            was passed under direct vision. Throughout the                            procedure, the patient's blood pressure, pulse, and                            oxygen saturations were monitored continuously. The                            Model PCF-H190L [id number]) scope was introduced                            through the anus and advanced to the the cecum,                             identified by appendiceal orifice and ileocecal                            valve. The colonoscopy was performed without                            difficulty. The patient tolerated the procedure                            well. The quality of the bowel preparation was                            good. The ileocecal valve, appendiceal orifice, and                            rectum were photographed. Scope In: 12:07:17 PM Scope Out: 12:27:27 PM Scope Withdrawal Time: 0 hours 15 minutes 12 seconds  Total Procedure Duration: 0 hours 20 minutes 10 seconds  Findings:                 The perianal and digital rectal examinations were                            normal.                           A 6 mm polyp was found in the hepatic flexure. The                            polyp was sessile. The polyp was removed with a                            cold snare. Resection and retrieval were complete.                           Three semi-pedunculated polyps were found in the  transverse colon. The polyps were 5 to 7 mm in                            size. These polyps were removed with a hot snare.                            Resection and retrieval were complete.                           A 5 mm polyp was found in the transverse colon. The                            polyp was sessile. The polyp was removed with a                            cold snare. Resection and retrieval were complete.                           Multiple diverticula were found in the sigmoid                            colon, descending colon, splenic flexure, hepatic                            flexure and ascending colon.                           The exam was otherwise without abnormality on                            direct and retroflexion views. Complications:            No immediate complications. Estimated Blood Loss:     Estimated blood loss was minimal. Impression:                - One 6 mm polyp at the hepatic flexure, removed                            with a cold snare. Resected and retrieved.                           - Three 5 to 7 mm polyps in the transverse colon,                            removed with a hot snare. Resected and retrieved.                           - One 5 mm polyp in the transverse colon, removed                            with a cold snare. Resected and retrieved.                           -  Mild diverticulosis in the sigmoid colon, in the                            descending colon, at the splenic flexure, at the                            hepatic flexure and in the ascending colon.                           - The examination was otherwise normal on direct                            and retroflexion views. Recommendation:           - Patient has a contact number available for                            emergencies. The signs and symptoms of potential                            delayed complications were discussed with the                            patient. Return to normal activities tomorrow.                            Written discharge instructions were provided to the                            patient.                           - Resume previous diet.                           - Continue present medications.                           - Await pathology results.                           - No aspirin, ibuprofen, naproxen, or other                            non-steroidal anti-inflammatory drugs for 2 weeks                            after polyp removal.                           - Repeat colonoscopy is recommended for adenoma                            surveillance. The colonoscopy date will be  determined after pathology results from today's                            exam become available for review. Jerene Bears, MD 10/09/2015 12:32:06 PM This report has been signed electronically.

## 2015-10-09 NOTE — Progress Notes (Signed)
  [facility] Post-op Note  Patient: Lisa Hutchinson  Procedure(s) Performed: colonoscopy  Patient Location: [facility] - Recovery Area  Anesthesia Type: Deep Sedation/Propofol  Level of Consciousness: awake, oriented and patient cooperative  Airway and Oxygen Therapy: Patient Spontanous Breathing  Post-op Pain: none  Post-op Assessment:  Post-op Vital signs reviewed, Patient's Cardiovascular Status Stable, Respiratory Function Stable, Patent Airway, No signs of Nausea or vomiting and Pain level controlled  Post-op Vital Signs: Reviewed and stable  Complications: No apparent anesthesia complications  Felix Meras E 12:33 PM

## 2015-10-09 NOTE — Progress Notes (Signed)
Called to room to assist during endoscopic procedure.  Patient ID and intended procedure confirmed with present staff. Received instructions for my participation in the procedure from the performing physician.  

## 2015-10-09 NOTE — Patient Instructions (Addendum)
Impressions/recommendations:  Polyps (handout given) Diverticulosis (handout given) High Fiber Diet (handout given)  NO ASPIRIN, ASPIRIN PRODUCTS OR NSAIDS (MOTRIN, ADVIL, IBUPROFEN OR ALEVE, ETC) FOR TWO WEEKS.   YOU HAD AN ENDOSCOPIC PROCEDURE TODAY AT [facility]:   Refer to the procedure report that was given to you for any specific questions about what was found during the examination.  If the procedure report does not answer your questions, please call your gastroenterologist to clarify.  If you requested that your care partner not be given the details of your procedure findings, then the procedure report has been included in a sealed envelope for you to review at your convenience later.  YOU SHOULD EXPECT: Some feelings of bloating in the abdomen. Passage of more gas than usual.  Walking can help get rid of the air that was put into your GI tract during the procedure and reduce the bloating. If you had a lower endoscopy (such as a colonoscopy or flexible sigmoidoscopy) you may notice spotting of blood in your stool or on the toilet paper. If you underwent a bowel prep for your procedure, you may not have a normal bowel movement for a few days.  Please Note:  You might notice some irritation and congestion in your nose or some drainage.  This is from the oxygen used during your procedure.  There is no need for concern and it should clear up in a day or so.  SYMPTOMS TO REPORT IMMEDIATELY:   Following lower endoscopy (colonoscopy or flexible sigmoidoscopy):  Excessive amounts of blood in the stool  Significant tenderness or worsening of abdominal pains  Swelling of the abdomen that is new, acute  Fever of 100F or higher   For urgent or emergent issues, a gastroenterologist can be reached at any hour by calling [phone].   DIET: Your first meal following the procedure should be a small meal and then it is ok to progress to your normal diet. Heavy or fried foods  are harder to digest and may make you feel nauseous or bloated.  Likewise, meals heavy in dairy and vegetables can increase bloating.  Drink plenty of fluids but you should avoid alcoholic beverages for 24 hours.  ACTIVITY:  You should plan to take it easy for the rest of today and you should NOT DRIVE or use heavy machinery until tomorrow (because of the sedation medicines used during the test).    FOLLOW UP: Our staff will call the number listed on your records the next business day following your procedure to check on you and address any questions or concerns that you may have regarding the information given to you following your procedure. If we do not reach you, we will leave a message.  However, if you are feeling well and you are not experiencing any problems, there is no need to return our call.  We will assume that you have returned to your regular daily activities without incident.  If any biopsies were taken you will be contacted by phone or by letter within the next 1-3 weeks.  Please call us at [phone] if you have not heard about the biopsies in 3 weeks.    SIGNATURES/CONFIDENTIALITY: You and/or your care partner have signed paperwork which will be entered into your electronic medical record.  These signatures attest to the fact that that the information above on your After Visit Summary has been reviewed and is understood.  Full responsibility of the confidentiality of this discharge information  lies with you and/or your care-partner.

## 2015-10-10 ENCOUNTER — Telehealth: Payer: Self-pay | Admitting: *Deleted

## 2015-10-10 NOTE — Telephone Encounter (Signed)
  Follow up Call-  Call back number 10/09/2015  Post procedure Call Back phone  # [phone]  Permission to leave phone message Yes     Patient questions:  Do you have a fever, pain , or abdominal swelling? No. Pain Score  0 *  Have you tolerated food without any problems? Yes.    Have you been able to return to your normal activities? Yes.    Do you have any questions about your discharge instructions: Diet   No. Medications  No. Follow up visit  No.  Do you have questions or concerns about your Care? No.  Actions: * If pain score is 4 or above: No action needed, pain <4.

## 2015-10-15 ENCOUNTER — Encounter: Payer: Self-pay | Admitting: Internal Medicine

## 2016-06-03 ENCOUNTER — Ambulatory Visit (INDEPENDENT_AMBULATORY_CARE_PROVIDER_SITE_OTHER): Payer: Federal, State, Local not specified - PPO | Admitting: Family Medicine

## 2016-06-03 ENCOUNTER — Ambulatory Visit
Admission: RE | Admit: 2016-06-03 | Discharge: 2016-06-03 | Disposition: A | Discharge status: Home / Self Care | Payer: Federal, State, Local not specified - PPO | Source: Ambulatory Visit | Attending: Family Medicine | Admitting: Family Medicine

## 2016-06-03 ENCOUNTER — Encounter: Payer: Self-pay | Admitting: Family Medicine

## 2016-06-03 VITALS — BP 110/70 | HR 100 | Wt 201.0 lb

## 2016-06-03 DIAGNOSIS — E669 Obesity, unspecified: Secondary | ICD-10-CM

## 2016-06-03 DIAGNOSIS — M25562 Pain in left knee: Secondary | ICD-10-CM

## 2016-06-03 DIAGNOSIS — M25552 Pain in left hip: Secondary | ICD-10-CM

## 2016-06-03 LAB — COMPREHENSIVE METABOLIC PANEL
ALBUMIN: 4 g/dL (ref 3.6–5.1)
ALT: 8 U/L (ref 6–29)
AST: 21 U/L (ref 10–35)
Alkaline Phosphatase: 66 U/L (ref 33–130)
BILIRUBIN TOTAL: 0.3 mg/dL (ref 0.2–1.2)
BUN: 12 mg/dL (ref 7–25)
CALCIUM: 9.8 mg/dL (ref 8.6–10.4)
CHLORIDE: 104 mmol/L (ref 98–110)
CO2: 28 mmol/L (ref 20–31)
Creat: 0.87 mg/dL (ref 0.50–0.99)
Glucose, Bld: 104 mg/dL — ABNORMAL HIGH (ref 65–99)
POTASSIUM: 3.6 mmol/L (ref 3.5–5.3)
Sodium: 141 mmol/L (ref 135–146)
Total Protein: 6.7 g/dL (ref 6.1–8.1)

## 2016-06-03 LAB — CBC WITH DIFFERENTIAL/PLATELET
BASOS ABS: 79 {cells}/uL (ref 0–200)
Basophils Relative: 1 %
Eosinophils Absolute: 474 cells/uL (ref 15–500)
Eosinophils Relative: 6 %
HEMATOCRIT: 40.9 % (ref 35.0–45.0)
HEMOGLOBIN: 13.3 g/dL (ref 11.7–15.5)
LYMPHS ABS: 3160 {cells}/uL (ref 850–3900)
Lymphocytes Relative: 40 %
MCH: 28.3 pg (ref 27.0–33.0)
MCHC: 32.5 g/dL (ref 32.0–36.0)
MCV: 87 fL (ref 80.0–100.0)
MONO ABS: 711 {cells}/uL (ref 200–950)
MPV: 10.4 fL (ref 7.5–12.5)
Monocytes Relative: 9 %
NEUTROS PCT: 44 %
Neutro Abs: 3476 cells/uL (ref 1500–7800)
Platelets: 285 10*3/uL (ref 140–400)
RBC: 4.7 MIL/uL (ref 3.80–5.10)
RDW: 13.3 % (ref 11.0–15.0)
WBC: 7.9 10*3/uL (ref 4.0–10.5)

## 2016-06-03 LAB — LIPID PANEL
Cholesterol: 185 mg/dL (ref <200)
HDL: 47 mg/dL — AB (ref >50)
LDL CALC: 87 mg/dL (ref <100)
TRIGLYCERIDES: 254 mg/dL — AB (ref <150)
Total CHOL/HDL Ratio: 3.9 Ratio (ref <5.0)
VLDL: 51 mg/dL — ABNORMAL HIGH (ref <30)

## 2016-06-03 NOTE — Progress Notes (Signed)
   Subjective:    Patient ID: Lisa Hutchinson, female    DOB: Dec 17, 1950, 66 y.o.   MRN: [id number]  HPI She is here for evaluation of a two-week history of left knee pain especially on the lateral aspect. This started spontaneously. No history of injury, popping, locking or grinding. No swelling. She has apparently had an injection in this knee at some time in the distant past. No other joints are involved. She actually states that walking makes the symptoms go away. She has tried pain meds including Aleve and ibuprofen at appropriate doses and noted no relief of her symptoms.   Review of Systems     Objective:   Physical Exam Exam of the left hip does show some pain with flexion as well as internal and external rotation. No true limitation of motion. Left knee exam shows no effusion. No tenderness palpation of the joint lines. Anterior cruciate intact. The EDL and lateral collateral ligaments intact. Negative McMurray's testing. No pain on motion of the knee.       Assessment & Plan:  Obesity (BMI 30-39.9) - Plan: CBC with Differential/Platelet, Comprehensive metabolic panel, Lipid panel  Acute pain of left knee  Left hip pain - Plan: DG HIP UNILAT W OR W/O PELVIS 2-3 VIEWS LEFT I explained that although she is having knee pain, her knee exam was entirely negative for any findings, her hip exam cause more difficulty. I will therefore look at that first and also do routine blood screening as her symptoms and exam are not diagnostic.

## 2016-06-04 ENCOUNTER — Other Ambulatory Visit: Payer: Self-pay | Admitting: Family Medicine

## 2016-06-04 DIAGNOSIS — Z1231 Encounter for screening mammogram for malignant neoplasm of breast: Secondary | ICD-10-CM

## 2016-06-08 ENCOUNTER — Telehealth: Payer: Self-pay

## 2016-06-08 NOTE — Telephone Encounter (Signed)
Patient wants to know what to do about xray I don't see where you commented on it please advise

## 2016-06-08 NOTE — Telephone Encounter (Signed)
She has some minor arthritic changes and I am pretty sure that my recommendation was to have her see an orthopedic doctor

## 2016-06-08 NOTE — Telephone Encounter (Signed)
Left message for pt on vm word for word

## 2016-06-09 ENCOUNTER — Ambulatory Visit (INDEPENDENT_AMBULATORY_CARE_PROVIDER_SITE_OTHER): Payer: Federal, State, Local not specified - PPO | Admitting: Orthopaedic Surgery

## 2016-06-09 ENCOUNTER — Encounter (INDEPENDENT_AMBULATORY_CARE_PROVIDER_SITE_OTHER): Payer: Self-pay | Admitting: Orthopaedic Surgery

## 2016-06-09 ENCOUNTER — Ambulatory Visit (INDEPENDENT_AMBULATORY_CARE_PROVIDER_SITE_OTHER): Payer: Self-pay

## 2016-06-09 DIAGNOSIS — M25562 Pain in left knee: Secondary | ICD-10-CM | POA: Insufficient documentation

## 2016-06-09 DIAGNOSIS — M5416 Radiculopathy, lumbar region: Secondary | ICD-10-CM | POA: Insufficient documentation

## 2016-06-09 MED ORDER — PREDNISONE 10 MG (21) PO TBPK: ORAL_TABLET | ORAL | 0 refills | Status: DC

## 2016-06-09 MED ORDER — METHOCARBAMOL 500 MG PO TABS: 500.0000 mg | ORAL_TABLET | Freq: Four times a day (QID) | ORAL | 2 refills | Status: DC | PRN

## 2016-06-09 NOTE — Progress Notes (Signed)
Office Visit Note   Patient: Lisa Hutchinson           Date of Birth: 11/19/1950           MRN: [id number] Visit Date: 06/09/2016              Requested by: Denita Lung, MD [address] [place], [place] [address] PCP: Wyatt Haste, MD   Assessment & Plan: Visit Diagnoses:  1. Acute pain of left knee   2. Lumbar radiculopathy     Plan: I feel that the patient is having a lumbar radiculopathy rather than hip or knee issues. Oral*with a redness on taper and muscle relaxers to see if this will calm it down. If not better she knows she can give [place] a call the next step would be to get an MRI. Follow up with me as needed.  Follow-Up Instructions: Return if symptoms worsen or fail to improve.   Orders:  Orders Placed This Encounter  Procedures  . XR KNEE 3 VIEW LEFT  . XR Lumbar Spine 2-3 Views   Meds ordered this encounter  Medications  . predniSONE (STERAPRED UNI-PAK 21 TAB) 10 MG (21) TBPK tablet    Sig: Take as directed    Dispense:  21 tablet    Refill:  0  . methocarbamol (ROBAXIN) 500 MG tablet    Sig: Take 1 tablet (500 mg total) by mouth every 6 (six) hours as needed for muscle spasms.    Dispense:  30 tablet    Refill:  2      Procedures: No procedures performed   Clinical Data: No additional findings.   Subjective: Chief Complaint  Patient presents with  . Left Hip - Pain  . Left Knee - Pain    66 yo female with left hip, left knee and occasional LBP comes in today.  This has been going one for several months.  Left knee feels like it wants to give out.  Pain will radiate from knee down the leg.  advil and aleve provide no real relief.  Endorses groin pain too.  Denies any focal deficits.  Has had injections in left hip and knee before with relief.    Review of Systems  Constitutional: Negative.   HENT: Negative.   Eyes: Negative.   Respiratory: Negative.   Cardiovascular: Negative.   Endocrine: Negative.   Musculoskeletal:  Negative.   Neurological: Negative.   Hematological: Negative.   Psychiatric/Behavioral: Negative.   All other systems reviewed and are negative.    Objective: Vital Signs: There were no vitals taken for this visit.  Physical Exam  Constitutional: She is oriented to person, place, and time. She appears well-developed and well-nourished.  HENT:  Head: Normocephalic and atraumatic.  Eyes: EOM are normal.  Neck: Neck supple.  Pulmonary/Chest: Effort normal.  Abdominal: Soft.  Neurological: She is alert and oriented to person, place, and time.  Skin: Skin is warm. Capillary refill takes less than 2 seconds.  Psychiatric: She has a normal mood and affect. Her behavior is normal. Judgment and thought content normal.  Nursing note and vitals reviewed.   Ortho Exam Left hip exam - neg stinchfield - pain with IR  - neg SLR sign  Left knee exam - no effusion - normal ROM - collaterals/cruciates normal  Specialty Comments:  No specialty comments available.  Imaging: No results found.   PMFS History: Patient Active Problem List   Diagnosis Date Noted  . Lumbar radiculopathy 06/09/2016  .  Acute pain of left knee 06/09/2016  . Arthritis 07/15/2015  . Obesity (BMI 30-39.9) 07/15/2015  . Atrophic vaginitis 08/10/2013  . Hypertension 05/27/2011  . Family history of colonic polyps 10/22/2010  . Hx of adenomatous colonic polyps 10/22/2010   Past Medical History:  Diagnosis Date  . Anemia    with pregnancy   . Atrophic vaginitis   . Cataract   . Edema   . H/O mammogram 05/03/12  . Hyperlipidemia    no medicines   . Hypertension   . Hypokalemia   . Sleep difficulties    sleep study normal, fall 2013    Family History  Problem Relation Age of Onset  . Colon polyps Mother 87    adenomatous polyps,ascending mass  . Hypertension Mother   . Hypertension Father   . Heart disease Neg Hx   . Hyperlipidemia Neg Hx   . Colon cancer Neg Hx   . Esophageal cancer Neg Hx     . Rectal cancer Neg Hx   . Stomach cancer Neg Hx     Past Surgical History:  Procedure Laterality Date  . ABDOMINAL HYSTERECTOMY     fribroids  . BREAST LUMPECTOMY     benign  . COLONOSCOPY  09/2010; 2007   tubular adenoma, Dr. Verl Blalock  . POLYPECTOMY     Social History   Occupational History  . Not on file.   Social History Main Topics  . Smoking status: Former Research scientist (life sciences)  . Smokeless tobacco: Never Used  . Alcohol use No  . Drug use: No  . Sexual activity: No

## 2016-06-15 ENCOUNTER — Telehealth (INDEPENDENT_AMBULATORY_CARE_PROVIDER_SITE_OTHER): Payer: Self-pay | Admitting: Orthopaedic Surgery

## 2016-06-15 NOTE — Telephone Encounter (Signed)
yes

## 2016-06-15 NOTE — Telephone Encounter (Signed)
Pt requests refill prednisone 10mg  to [facility] on [address]  [phone]

## 2016-06-15 NOTE — Telephone Encounter (Signed)
Please advise 

## 2016-06-16 ENCOUNTER — Other Ambulatory Visit (INDEPENDENT_AMBULATORY_CARE_PROVIDER_SITE_OTHER): Payer: Self-pay

## 2016-06-16 MED ORDER — PREDNISONE 10 MG (21) PO TBPK: ORAL_TABLET | ORAL | 0 refills | Status: DC

## 2016-06-16 NOTE — Telephone Encounter (Signed)
Called pt to let her know Rx was faxed into her pharm. LMOM

## 2016-07-15 ENCOUNTER — Ambulatory Visit (INDEPENDENT_AMBULATORY_CARE_PROVIDER_SITE_OTHER): Payer: Federal, State, Local not specified - PPO | Admitting: Family Medicine

## 2016-07-15 ENCOUNTER — Ambulatory Visit
Admission: RE | Admit: 2016-07-15 | Discharge: 2016-07-15 | Disposition: A | Discharge status: Home / Self Care | Payer: Federal, State, Local not specified - PPO | Source: Ambulatory Visit | Attending: Family Medicine | Admitting: Family Medicine

## 2016-07-15 ENCOUNTER — Encounter: Payer: Self-pay | Admitting: Family Medicine

## 2016-07-15 VITALS — BP 120/70 | HR 70 | Ht 63.0 in | Wt 197.0 lb

## 2016-07-15 DIAGNOSIS — Z86010 Personal history of colonic polyps: Secondary | ICD-10-CM | POA: Diagnosis not present

## 2016-07-15 DIAGNOSIS — E669 Obesity, unspecified: Secondary | ICD-10-CM | POA: Diagnosis not present

## 2016-07-15 DIAGNOSIS — I10 Essential (primary) hypertension: Secondary | ICD-10-CM | POA: Diagnosis not present

## 2016-07-15 DIAGNOSIS — M1990 Unspecified osteoarthritis, unspecified site: Secondary | ICD-10-CM

## 2016-07-15 DIAGNOSIS — Z780 Asymptomatic menopausal state: Secondary | ICD-10-CM

## 2016-07-15 DIAGNOSIS — Z1231 Encounter for screening mammogram for malignant neoplasm of breast: Secondary | ICD-10-CM

## 2016-07-15 DIAGNOSIS — Z23 Encounter for immunization: Secondary | ICD-10-CM

## 2016-07-15 LAB — HM MAMMOGRAPHY

## 2016-07-15 MED ORDER — LOSARTAN POTASSIUM-HCTZ 50-12.5 MG PO TABS: 1.0000 | ORAL_TABLET | Freq: Every day | ORAL | 3 refills | Status: DC

## 2016-07-15 MED ORDER — AMLODIPINE BESYLATE 5 MG PO TABS: 5.0000 mg | ORAL_TABLET | Freq: Every day | ORAL | 3 refills | Status: DC

## 2016-07-15 NOTE — Patient Instructions (Signed)
Go ahead and stop the Estrace

## 2016-07-15 NOTE — Progress Notes (Signed)
   Subjective:    Patient ID: Lisa Hutchinson, female    DOB: 04/25/1951, 66 y.o.   MRN: [id number]  HPI  menopausal and would like to have a Dexa scan done. She was recently seen by orthopedics and was given prednisone which did help with her arthritic type pain in the hip and knees. She does have a history of colonic polyps but did have a colonoscopy in 2017. He continues on her blood pressure medication and is having no difficulty with that. She recognizes the need to lose weight but so far has been able to do that. She does have a previous history of atrophic vaginitis but presently is having no difficulty and would like to stop that medication.   Review of Systems     Objective:   Physical Exam Alert and in no distress. Tympanic membranes and canals are normal. Pharyngeal area is normal. Neck is supple without adenopathy or thyromegaly. Cardiac exam shows a regular sinus rhythm without murmurs or gallops. Lungs are clear to auscultation.        Assessment & Plan:  Arthritis  Postmenopausal - Plan: DG Bone Density  Hx of adenomatous colonic polyps  Obesity (BMI 30-39.9)  Need for shingles vaccine - Plan: Varicella-zoster vaccine IM (Shingrix)  Essential hypertension - Plan: losartan-hydrochlorothiazide (HYZAAR) 50-12.5 MG tablet, amLODipine (NORVASC) 5 MG tablet  Discussed the arthritis and possible further injections as well as the possible need for joint replacement. She will continue to conservatively treat the arthritis. DEXA scan ordered. Encouraged her to get involved in an exercise programThe new Shingrix shot was given. She did check with her insurance and it is covered. Return  Here in 2 months for the next shot. She will also stop the Estrace and let me know she has any difficulty with that.

## 2016-09-28 ENCOUNTER — Other Ambulatory Visit (INDEPENDENT_AMBULATORY_CARE_PROVIDER_SITE_OTHER): Payer: Federal, State, Local not specified - PPO

## 2016-09-28 DIAGNOSIS — Z23 Encounter for immunization: Secondary | ICD-10-CM | POA: Diagnosis not present

## 2016-09-29 ENCOUNTER — Other Ambulatory Visit: Payer: Federal, State, Local not specified - PPO

## 2017-06-01 ENCOUNTER — Other Ambulatory Visit: Payer: Self-pay | Admitting: Family Medicine

## 2017-06-01 DIAGNOSIS — Z139 Encounter for screening, unspecified: Secondary | ICD-10-CM

## 2017-07-06 ENCOUNTER — Encounter: Payer: Self-pay | Admitting: Family Medicine

## 2017-07-06 ENCOUNTER — Ambulatory Visit: Payer: Federal, State, Local not specified - PPO | Admitting: Family Medicine

## 2017-07-06 VITALS — BP 110/78 | HR 70 | Wt 183.0 lb

## 2017-07-06 DIAGNOSIS — E669 Obesity, unspecified: Secondary | ICD-10-CM

## 2017-07-06 DIAGNOSIS — Z780 Asymptomatic menopausal state: Secondary | ICD-10-CM

## 2017-07-06 DIAGNOSIS — I10 Essential (primary) hypertension: Secondary | ICD-10-CM | POA: Diagnosis not present

## 2017-07-06 DIAGNOSIS — M1990 Unspecified osteoarthritis, unspecified site: Secondary | ICD-10-CM

## 2017-07-06 MED ORDER — LOSARTAN POTASSIUM-HCTZ 50-12.5 MG PO TABS: 1.0000 | ORAL_TABLET | Freq: Every day | ORAL | 3 refills | Status: DC

## 2017-07-06 MED ORDER — AMLODIPINE BESYLATE 5 MG PO TABS: 5.0000 mg | ORAL_TABLET | Freq: Every day | ORAL | 3 refills | Status: DC

## 2017-07-06 NOTE — Progress Notes (Signed)
   Subjective:    Patient ID: Lisa Hutchinson, female    DOB: Apr 08, 1951, 67 y.o.   MRN: [id number]  HPI She is here for a med check appointment.  She was recently seen by her gynecologist and did have a Pap as well as a DEXA and mammogram ordered.  She continues on her blood pressure medications and is having no difficulty with them.  She will come back in several months for a complete examination.  She has had some difficulty with arthritis but tries to keep herself physically active.  Social and family history as well as health maintenance and immunizations were reviewed   Review of Systems     Objective:   Physical Exam Alert and in no distress. Tympanic membranes and canals are normal. Pharyngeal area is normal. Neck is supple without adenopathy or thyromegaly. Cardiac exam shows a regular sinus rhythm without murmurs or gallops. Lungs are clear to auscultation.        Assessment & Plan:  Essential hypertension - Plan: CBC with Differential/Platelet, Comprehensive metabolic panel, Lipid panel  Postmenopausal  Arthritis  Obesity (BMI 30-39.9) - Plan: CBC with Differential/Platelet, Comprehensive metabolic panel, Lipid panel

## 2017-07-07 LAB — CBC WITH DIFFERENTIAL/PLATELET
Basophils Absolute: 0 10*3/uL (ref 0.0–0.2)
Basos: 0 %
EOS (ABSOLUTE): 0.3 10*3/uL (ref 0.0–0.4)
EOS: 4 %
HEMATOCRIT: 41.2 % (ref 34.0–46.6)
HEMOGLOBIN: 13.4 g/dL (ref 11.1–15.9)
IMMATURE GRANULOCYTES: 0 %
Immature Grans (Abs): 0 10*3/uL (ref 0.0–0.1)
Lymphocytes Absolute: 2.9 10*3/uL (ref 0.7–3.1)
Lymphs: 45 %
MCH: 27.9 pg (ref 26.6–33.0)
MCHC: 32.5 g/dL (ref 31.5–35.7)
MCV: 86 fL (ref 79–97)
MONOCYTES: 8 %
Monocytes Absolute: 0.5 10*3/uL (ref 0.1–0.9)
NEUTROS PCT: 43 %
Neutrophils Absolute: 2.8 10*3/uL (ref 1.4–7.0)
Platelets: 280 10*3/uL (ref 150–379)
RBC: 4.8 x10E6/uL (ref 3.77–5.28)
RDW: 13.3 % (ref 12.3–15.4)
WBC: 6.5 10*3/uL (ref 3.4–10.8)

## 2017-07-07 LAB — COMPREHENSIVE METABOLIC PANEL
ALBUMIN: 4.1 g/dL (ref 3.6–4.8)
ALT: 9 IU/L (ref 0–32)
AST: 20 IU/L (ref 0–40)
Albumin/Globulin Ratio: 1.5 (ref 1.2–2.2)
Alkaline Phosphatase: 80 IU/L (ref 39–117)
BUN / CREAT RATIO: 13 (ref 12–28)
BUN: 9 mg/dL (ref 8–27)
Bilirubin Total: 0.4 mg/dL (ref 0.0–1.2)
CALCIUM: 9.8 mg/dL (ref 8.7–10.3)
CO2: 22 mmol/L (ref 20–29)
CREATININE: 0.7 mg/dL (ref 0.57–1.00)
Chloride: 103 mmol/L (ref 96–106)
GFR calc Af Amer: 105 mL/min/{1.73_m2} (ref >59)
GFR, EST NON AFRICAN AMERICAN: 91 mL/min/{1.73_m2} (ref >59)
GLOBULIN, TOTAL: 2.7 g/dL (ref 1.5–4.5)
Glucose: 81 mg/dL (ref 65–99)
Potassium: 3.8 mmol/L (ref 3.5–5.2)
SODIUM: 143 mmol/L (ref 134–144)
TOTAL PROTEIN: 6.8 g/dL (ref 6.0–8.5)

## 2017-07-07 LAB — LIPID PANEL
CHOL/HDL RATIO: 3.5 ratio (ref 0.0–4.4)
Cholesterol, Total: 190 mg/dL (ref 100–199)
HDL: 54 mg/dL (ref >39)
LDL CALC: 116 mg/dL — AB (ref 0–99)
TRIGLYCERIDES: 99 mg/dL (ref 0–149)
VLDL Cholesterol Cal: 20 mg/dL (ref 5–40)

## 2017-07-16 ENCOUNTER — Ambulatory Visit: Payer: Federal, State, Local not specified - PPO

## 2017-07-20 ENCOUNTER — Ambulatory Visit: Payer: Federal, State, Local not specified - PPO

## 2017-07-20 ENCOUNTER — Ambulatory Visit
Admission: RE | Admit: 2017-07-20 | Discharge: 2017-07-20 | Disposition: A | Discharge status: Home / Self Care | Payer: Medicare Other | Source: Ambulatory Visit | Attending: Family Medicine | Admitting: Family Medicine

## 2017-07-20 DIAGNOSIS — Z139 Encounter for screening, unspecified: Secondary | ICD-10-CM

## 2017-07-20 DIAGNOSIS — Z1231 Encounter for screening mammogram for malignant neoplasm of breast: Secondary | ICD-10-CM | POA: Diagnosis not present

## 2017-07-27 DIAGNOSIS — E559 Vitamin D deficiency, unspecified: Secondary | ICD-10-CM | POA: Diagnosis not present

## 2017-10-14 DIAGNOSIS — E559 Vitamin D deficiency, unspecified: Secondary | ICD-10-CM | POA: Diagnosis not present

## 2017-12-20 ENCOUNTER — Ambulatory Visit (INDEPENDENT_AMBULATORY_CARE_PROVIDER_SITE_OTHER): Payer: Medicare Other | Admitting: Family Medicine

## 2017-12-20 ENCOUNTER — Encounter: Payer: Self-pay | Admitting: Family Medicine

## 2017-12-20 VITALS — BP 138/80 | HR 76 | Ht 63.0 in | Wt 189.0 lb

## 2017-12-20 DIAGNOSIS — M8580 Other specified disorders of bone density and structure, unspecified site: Secondary | ICD-10-CM

## 2017-12-20 DIAGNOSIS — Z8371 Family history of colonic polyps: Secondary | ICD-10-CM | POA: Diagnosis not present

## 2017-12-20 DIAGNOSIS — Z0000 Encounter for general adult medical examination without abnormal findings: Secondary | ICD-10-CM

## 2017-12-20 DIAGNOSIS — E669 Obesity, unspecified: Secondary | ICD-10-CM

## 2017-12-20 DIAGNOSIS — Z860101 Personal history of adenomatous and serrated colon polyps: Secondary | ICD-10-CM

## 2017-12-20 DIAGNOSIS — Z23 Encounter for immunization: Secondary | ICD-10-CM | POA: Diagnosis not present

## 2017-12-20 DIAGNOSIS — Z780 Asymptomatic menopausal state: Secondary | ICD-10-CM

## 2017-12-20 DIAGNOSIS — I10 Essential (primary) hypertension: Secondary | ICD-10-CM | POA: Diagnosis not present

## 2017-12-20 DIAGNOSIS — Z86010 Personal history of colonic polyps: Secondary | ICD-10-CM

## 2017-12-20 DIAGNOSIS — Z83719 Family history of colon polyps, unspecified: Secondary | ICD-10-CM

## 2017-12-20 LAB — POCT URINALYSIS DIP (PROADVANTAGE DEVICE)
BILIRUBIN UA: NEGATIVE
Blood, UA: NEGATIVE
Glucose, UA: NEGATIVE mg/dL
Ketones, POC UA: NEGATIVE mg/dL
Leukocytes, UA: NEGATIVE
NITRITE UA: NEGATIVE
PH UA: 8 (ref 5.0–8.0)
Protein Ur, POC: NEGATIVE mg/dL
Specific Gravity, Urine: 1.01
Urobilinogen, Ur: NEGATIVE

## 2017-12-20 MED ORDER — HYDROCHLOROTHIAZIDE 12.5 MG PO CAPS: 12.5000 mg | ORAL_CAPSULE | Freq: Every day | ORAL | 3 refills | Status: DC

## 2017-12-20 MED ORDER — LOSARTAN POTASSIUM 50 MG PO TABS: 50.0000 mg | ORAL_TABLET | Freq: Every day | ORAL | 3 refills | Status: DC

## 2017-12-20 NOTE — Patient Instructions (Addendum)
20 minutes of something physical every day or 150 minutes a week Look at carbohydrate.  Look into what is called the DASH diet  Ms. Bribiesca , Thank you for taking time to come for your Medicare Wellness Visit. I appreciate your ongoing commitment to your health goals. Please review the following plan we discussed and let me know if I can assist you in the future.   These are the goals we discussed: Goals   None     This is a list of the screening recommended for you and due dates:  Health Maintenance  Topic Date Due  . HIV Screening  06/13/1967  . Pap Smear  06/12/1973  . DEXA scan (bone density measurement)  06/12/2017  . Pneumonia vaccines (1 of 2 - PCV13) 06/12/2017  . Flu Shot  11/25/2017  . Colon Cancer Screening  10/09/2018  . Mammogram  07/21/2019  . Tetanus Vaccine  07/19/2022  .  Hepatitis C: One time screening is recommended by Center for Disease Control  (CDC) for  adults born from [birth year] through 1963.   Completed

## 2017-12-20 NOTE — Progress Notes (Signed)
Lisa Hutchinson is a 67 y.o. female who presents for annual wellness visit and follow-up on chronic medical conditions.  She has the following concerns: She does occasionally note the right knee giving way but no pain associated with it as well as no popping, locking or grinding.  It does not interfere with her ability to do her ADLs.  Approximately 1 month ago she did fall landing on her right shoulder and now has difficulty sleeping on that side.  She continues on her amlodipine and losartan as well as calcium.  Apparently her gynecologist told her she does have osteopenia and is now using vitamin D and calcium.  She also has a history of adenomatous colonic polyp and will be scheduled for follow-up colonoscopy next year.  She is definitely enjoying her retirement. Immunizations and Health Maintenance Immunization History  Administered Date(s) Administered  . Influenza,inj,Quad PF,6+ Mos 02/21/2013, 01/24/2014  . Tdap 07/18/2012  . Zoster 09/23/2012  . Zoster Recombinat (Shingrix) 07/15/2016, 09/28/2016   Health Maintenance Due  Topic Date Due  . HIV Screening  06/13/1967  . PAP SMEAR  06/12/1973  . DEXA SCAN  06/12/2017  . PNA vac Low Risk Adult (1 of 2 - PCV13) 06/12/2017  . INFLUENZA VACCINE  11/25/2017    Last Pap smear:N/A Last mammogram: Last colonoscopy:2017 Last DEXA:2019 Dentist:[facility]ho:[facility] Exercise: 5 days/week for 1/2-hour.  Other doctors caring for patient include:[facility]  Advanced directives: Yes.  Copy asked for.   Depression screen:  See questionnaire below.  Depression screen [facility] 2/9 12/20/2017 07/15/2016  Decreased Interest 0 0  Down, Depressed, Hopeless 0 0  PHQ - 2 Score 0 0    Fall Risk Screen: see questionnaire below. Fall Risk  12/20/2017 07/15/2016  Falls in the past year? Yes No  Number falls in past yr: 1 -  Injury with Fall? Yes -    ADL screen:  See questionnaire below Functional Status Survey: Is the patient deaf or have  difficulty hearing?: No Does the patient have difficulty seeing, even when wearing glasses/contacts?: Yes(optho said touch of glaucoma) Does the patient have difficulty concentrating, remembering, or making decisions?: No Does the patient have difficulty walking or climbing stairs?: No Does the patient have difficulty dressing or bathing?: No Does the patient have difficulty doing errands alone such as visiting a doctor's office or shopping?: No   Review of Systems Constitutional: -, -unexpected weight change, -anorexia, -fatigue Allergy: -sneezing, -itching, -congestion Dermatology: denies changing moles, rash, lumps ENT: -runny nose, -ear pain, -sore throat,  Cardiology:  -chest pain, -palpitations, -orthopnea, Respiratory: -cough, -shortness of breath, -dyspnea on exertion, -wheezing,  Gastroenterology: -abdominal pain, -nausea, -vomiting, -diarrhea, -constipation, -dysphagia Hematology: -bleeding or bruising problems Musculoskeletal: -arthralgias, -myalgias, -joint swelling, -back pain, - Ophthalmology: -vision changes,  Urology: -dysuria, -difficulty urinating,  -urinary frequency, -urgency, incontinence Neurology: -, -numbness, , -memory loss, -falls, -dizziness    PHYSICAL EXAM:  BP 138/80   Pulse 76   Ht 5\' 3"  (1.6 m)   Wt 189 lb (85.7 kg)   BMI 33.48 kg/m   General Appearance: Alert, cooperative, no distress, appears stated age Head: Normocephalic, without obvious abnormality, atraumatic Eyes: PERRL, conjunctiva/corneas clear, EOM's intact, fundi benign Ears: Normal TM's and external ear canals Nose: Nares normal, mucosa normal, no drainage or sinus tenderness Throat: Lips, mucosa, and tongue normal; teeth and gums normal Neck: Supple, no lymphadenopathy;  thyroid:  no enlargement/tenderness/nodules; no carotid bruit or JVD Lungs: Clear to auscultation bilaterally without wheezes, rales or ronchi; respirations  unlabored Heart: Regular rate and rhythm, S1 and S2  normal, no murmur, rubor gallop Abdomen: Soft, non-tender, nondistended, normoactive bowel sounds,  no masses, no hepatosplenomegaly Extremities: No clubbing, cyanosis or edema.  Right shoulder exam shows full motion with no laxity.  Negative drop arm test.  Negative Neer's and Hawkins test. Pulses: 2+ and symmetric all extremities Skin:  Skin color, texture, turgor normal, no rashes or lesions Lymph nodes: Cervical, supraclavicular, and axillary nodes normal Neurologic:  CNII-XII intact, normal strength, sensation and gait; reflexes 2+ and symmetric throughout Psych: Normal mood, affect, hygiene and grooming.  ASSESSMENT/PLAN: Welcome to Medicare preventive visit - Plan: POCT Urinalysis DIP (Proadvantage Device)  Postmenopausal  Family history of colonic polyps  Obesity (BMI 30-39.9)  Essential hypertension - Plan: losartan (COZAAR) 50 MG tablet, hydrochlorothiazide (MICROZIDE) 12.5 MG capsule  Hx of adenomatous colonic polyps  Need for vaccination against Streptococcus pneumoniae - Plan: Pneumococcal conjugate vaccine 13-valent  Osteopenia, unspecified location     Discussed monthly self breast exams and yearly mammograms; at least 30 minutes of aerobic activity at least 5 days/week and weight-bearing exercise 2x/week; healthy diet, including goals of calcium and vitamin D intake and alcohol recommendations reviewed; regular seatbelt use;  Immunization recommendations discussed.  Colonoscopy recommendations reviewed   Medicare Attestation I have personally reviewed: The patient's medical and social history Their use of alcohol, tobacco or illicit drugs Their current medications and supplements The patient's functional ability including ADLs,fall risks, home safety risks, cognitive, and hearing and visual impairment Diet and physical activities Evidence for depression or mood disorders  The patient's weight, height, and BMI have been recorded in the chart.  I have made  referrals, counseling, and provided education to the patient based on review of the above and I have provided the patient with a written personalized care plan for preventive services.     Jill Alexanders, MD   12/20/2017

## 2018-01-03 DIAGNOSIS — E559 Vitamin D deficiency, unspecified: Secondary | ICD-10-CM | POA: Diagnosis not present

## 2018-03-11 DIAGNOSIS — H40013 Open angle with borderline findings, low risk, bilateral: Secondary | ICD-10-CM | POA: Diagnosis not present

## 2018-05-17 DIAGNOSIS — Z6832 Body mass index (BMI) 32.0-32.9, adult: Secondary | ICD-10-CM | POA: Diagnosis not present

## 2018-05-17 DIAGNOSIS — Z124 Encounter for screening for malignant neoplasm of cervix: Secondary | ICD-10-CM | POA: Diagnosis not present

## 2018-05-17 DIAGNOSIS — N952 Postmenopausal atrophic vaginitis: Secondary | ICD-10-CM | POA: Diagnosis not present

## 2018-05-17 DIAGNOSIS — Z01419 Encounter for gynecological examination (general) (routine) without abnormal findings: Secondary | ICD-10-CM | POA: Diagnosis not present

## 2018-05-17 DIAGNOSIS — L293 Anogenital pruritus, unspecified: Secondary | ICD-10-CM | POA: Diagnosis not present

## 2018-05-17 LAB — HM PAP SMEAR

## 2018-05-17 LAB — RESULTS CONSOLE HPV: CHL HPV: NEGATIVE

## 2018-05-24 ENCOUNTER — Telehealth: Payer: Self-pay | Admitting: Family Medicine

## 2018-05-24 NOTE — Telephone Encounter (Signed)
Records received from [facility]. Mammogram and Pap Smear was included. Left message on 05/24/2018 in inquire about bone density.

## 2018-05-27 ENCOUNTER — Encounter: Payer: Self-pay | Admitting: Family Medicine

## 2018-06-14 ENCOUNTER — Encounter: Payer: Self-pay | Admitting: Family Medicine

## 2018-06-16 ENCOUNTER — Other Ambulatory Visit: Payer: Self-pay | Admitting: Family Medicine

## 2018-06-16 DIAGNOSIS — Z1231 Encounter for screening mammogram for malignant neoplasm of breast: Secondary | ICD-10-CM

## 2018-06-22 ENCOUNTER — Ambulatory Visit (INDEPENDENT_AMBULATORY_CARE_PROVIDER_SITE_OTHER): Payer: Medicare Other | Admitting: Family Medicine

## 2018-06-22 ENCOUNTER — Encounter: Payer: Self-pay | Admitting: Family Medicine

## 2018-06-22 VITALS — BP 124/82 | HR 81 | Temp 97.8°F | Wt 191.6 lb

## 2018-06-22 DIAGNOSIS — Z86010 Personal history of colonic polyps: Secondary | ICD-10-CM | POA: Diagnosis not present

## 2018-06-22 DIAGNOSIS — M1990 Unspecified osteoarthritis, unspecified site: Secondary | ICD-10-CM | POA: Diagnosis not present

## 2018-06-22 DIAGNOSIS — E669 Obesity, unspecified: Secondary | ICD-10-CM

## 2018-06-22 DIAGNOSIS — I10 Essential (primary) hypertension: Secondary | ICD-10-CM

## 2018-06-22 DIAGNOSIS — Z23 Encounter for immunization: Secondary | ICD-10-CM

## 2018-06-22 DIAGNOSIS — Z13220 Encounter for screening for lipoid disorders: Secondary | ICD-10-CM

## 2018-06-22 MED ORDER — AMLODIPINE BESYLATE 5 MG PO TABS: 5.0000 mg | ORAL_TABLET | Freq: Every day | ORAL | 3 refills | Status: DC

## 2018-06-22 MED ORDER — HYDROCHLOROTHIAZIDE 12.5 MG PO CAPS: 12.5000 mg | ORAL_CAPSULE | Freq: Every day | ORAL | 3 refills | Status: DC

## 2018-06-22 MED ORDER — LOSARTAN POTASSIUM 50 MG PO TABS
50.0000 mg | ORAL_TABLET | Freq: Every day | ORAL | 3 refills | Status: DC
Start: 2018-06-22 — End: 2019-05-23

## 2018-06-22 NOTE — Patient Instructions (Signed)

## 2018-06-22 NOTE — Progress Notes (Signed)
   Subjective:    Patient ID: Lisa Hutchinson, female    DOB: 08/16/1950, 68 y.o.   MRN: [id number]  HPI She is here for an interval evaluation.  She does have issues with sleep and says that she does tend to wake up intermittently throughout the night.  She did have a sleep study done in 2013 however OSA was not diagnosed.  She also has a history of colonic polyps and is scheduled for follow-up this year.  Her arthritis is not causing much difficulty.  She does keep her self physically active.  She apparently did have a DEXA scan recently which was significant for osteopenia.  She does take extra vitamins D and calcium.  She does not smoke.   Review of Systems     Objective:   Physical Exam Alert and in no distress. Tympanic membranes and canals are normal. Pharyngeal area is normal. Neck is supple without adenopathy or thyromegaly. Cardiac exam shows a regular sinus rhythm without murmurs or gallops. Lungs are clear to auscultation.        Assessment & Plan:  Essential hypertension - Plan: amLODipine (NORVASC) 5 MG tablet, hydrochlorothiazide (MICROZIDE) 12.5 MG capsule, losartan (COZAAR) 50 MG tablet  Hx of adenomatous colonic polyps  Arthritis  Obesity (BMI 30-39.9) - Plan: CBC with Differential/Platelet, Comprehensive metabolic panel, Lipid panel  Need for influenza vaccination  Screening for lipid disorders - Plan: Lipid panel Encouraged her to stay physically active.  I will renew her blood pressure medications.  She will follow-up with gastroenterology.  We will get the results of her DEXA scan.

## 2018-06-23 LAB — COMPREHENSIVE METABOLIC PANEL
ALK PHOS: 74 IU/L (ref 39–117)
ALT: 10 IU/L (ref 0–32)
AST: 16 IU/L (ref 0–40)
Albumin/Globulin Ratio: 1.9 (ref 1.2–2.2)
Albumin: 4.2 g/dL (ref 3.8–4.8)
BUN/Creatinine Ratio: 14 (ref 12–28)
BUN: 10 mg/dL (ref 8–27)
Bilirubin Total: 0.4 mg/dL (ref 0.0–1.2)
CHLORIDE: 102 mmol/L (ref 96–106)
CO2: 27 mmol/L (ref 20–29)
CREATININE: 0.73 mg/dL (ref 0.57–1.00)
Calcium: 9.3 mg/dL (ref 8.7–10.3)
GFR calc Af Amer: 99 mL/min/{1.73_m2} (ref >59)
GFR calc non Af Amer: 86 mL/min/{1.73_m2} (ref >59)
GLUCOSE: 75 mg/dL (ref 65–99)
Globulin, Total: 2.2 g/dL (ref 1.5–4.5)
Potassium: 4 mmol/L (ref 3.5–5.2)
Sodium: 139 mmol/L (ref 134–144)
Total Protein: 6.4 g/dL (ref 6.0–8.5)

## 2018-06-23 LAB — CBC WITH DIFFERENTIAL/PLATELET
BASOS ABS: 0.1 10*3/uL (ref 0.0–0.2)
Basos: 1 %
EOS (ABSOLUTE): 0.3 10*3/uL (ref 0.0–0.4)
Eos: 5 %
HEMOGLOBIN: 13.1 g/dL (ref 11.1–15.9)
Hematocrit: 38.8 % (ref 34.0–46.6)
Immature Grans (Abs): 0 10*3/uL (ref 0.0–0.1)
Immature Granulocytes: 0 %
Lymphocytes Absolute: 2.6 10*3/uL (ref 0.7–3.1)
Lymphs: 39 %
MCH: 28.7 pg (ref 26.6–33.0)
MCHC: 33.8 g/dL (ref 31.5–35.7)
MCV: 85 fL (ref 79–97)
MONOCYTES: 11 %
Monocytes Absolute: 0.8 10*3/uL (ref 0.1–0.9)
Neutrophils Absolute: 2.9 10*3/uL (ref 1.4–7.0)
Neutrophils: 44 %
PLATELETS: 291 10*3/uL (ref 150–450)
RBC: 4.56 x10E6/uL (ref 3.77–5.28)
RDW: 12.3 % (ref 11.7–15.4)
WBC: 6.7 10*3/uL (ref 3.4–10.8)

## 2018-06-23 LAB — LIPID PANEL
CHOLESTEROL TOTAL: 184 mg/dL (ref 100–199)
Chol/HDL Ratio: 3.6 ratio (ref 0.0–4.4)
HDL: 51 mg/dL (ref >39)
LDL Calculated: 111 mg/dL — ABNORMAL HIGH (ref 0–99)
TRIGLYCERIDES: 110 mg/dL (ref 0–149)
VLDL CHOLESTEROL CAL: 22 mg/dL (ref 5–40)

## 2018-07-04 DIAGNOSIS — H40013 Open angle with borderline findings, low risk, bilateral: Secondary | ICD-10-CM | POA: Diagnosis not present

## 2018-07-25 ENCOUNTER — Ambulatory Visit: Payer: Medicare Other

## 2018-08-18 ENCOUNTER — Ambulatory Visit: Payer: Medicare Other

## 2018-09-28 ENCOUNTER — Encounter: Payer: Self-pay | Admitting: Internal Medicine

## 2018-09-29 ENCOUNTER — Encounter: Payer: Self-pay | Admitting: Internal Medicine

## 2018-10-03 ENCOUNTER — Ambulatory Visit
Admission: RE | Admit: 2018-10-03 | Discharge: 2018-10-03 | Disposition: A | Discharge status: Home / Self Care | Payer: Medicare Other | Source: Ambulatory Visit | Attending: Family Medicine | Admitting: Family Medicine

## 2018-10-03 ENCOUNTER — Other Ambulatory Visit: Payer: Self-pay

## 2018-10-03 DIAGNOSIS — Z1231 Encounter for screening mammogram for malignant neoplasm of breast: Secondary | ICD-10-CM | POA: Diagnosis not present

## 2018-10-03 LAB — HM MAMMOGRAPHY

## 2018-11-02 ENCOUNTER — Other Ambulatory Visit: Payer: Self-pay

## 2018-11-02 ENCOUNTER — Ambulatory Visit (AMBULATORY_SURGERY_CENTER): Payer: Medicare Other | Admitting: *Deleted

## 2018-11-02 VITALS — Ht 63.0 in | Wt 190.0 lb

## 2018-11-02 DIAGNOSIS — Z86010 Personal history of colonic polyps: Secondary | ICD-10-CM

## 2018-11-02 MED ORDER — NA SULFATE-K SULFATE-MG SULF 17.5-3.13-1.6 GM/177ML PO SOLN: 1.0000 | Freq: Once | ORAL | 0 refills | Status: AC

## 2018-11-02 NOTE — Progress Notes (Signed)

## 2018-11-15 ENCOUNTER — Telehealth: Payer: Self-pay | Admitting: Internal Medicine

## 2018-11-15 NOTE — Telephone Encounter (Signed)

## 2018-11-16 ENCOUNTER — Encounter: Payer: Self-pay | Admitting: Internal Medicine

## 2018-11-16 ENCOUNTER — Other Ambulatory Visit: Payer: Self-pay

## 2018-11-16 ENCOUNTER — Ambulatory Visit (AMBULATORY_SURGERY_CENTER): Payer: Medicare Other | Admitting: Internal Medicine

## 2018-11-16 VITALS — BP 117/67 | HR 62 | Temp 97.3°F | Resp 12 | Ht 63.0 in | Wt 190.0 lb

## 2018-11-16 DIAGNOSIS — I10 Essential (primary) hypertension: Secondary | ICD-10-CM | POA: Diagnosis not present

## 2018-11-16 DIAGNOSIS — Z86010 Personal history of colonic polyps: Secondary | ICD-10-CM | POA: Diagnosis not present

## 2018-11-16 MED ORDER — SODIUM CHLORIDE 0.9 % IV SOLN: 500.0000 mL | Freq: Once | INTRAVENOUS | Status: DC

## 2018-11-16 NOTE — Progress Notes (Signed)
Report to PACU, RN, vss, BBS= Clear.  

## 2018-11-16 NOTE — Progress Notes (Signed)
Pt's states no medical or surgical changes since previsit or office visit.   covid-19 screen and temp @ front desk by Loma Sousa   Adm vs by Rica Mote

## 2018-11-16 NOTE — Patient Instructions (Signed)
Discharge instructions given. Handouts on Diverticulosis and Hemorrhoids. Resume previous medications. YOU HAD AN ENDOSCOPIC PROCEDURE TODAY AT [facility]:   Refer to the procedure report that was given to you for any specific questions about what was found during the examination.  If the procedure report does not answer your questions, please call your gastroenterologist to clarify.  If you requested that your care partner not be given the details of your procedure findings, then the procedure report has been included in a sealed envelope for you to review at your convenience later.  YOU SHOULD EXPECT: Some feelings of bloating in the abdomen. Passage of more gas than usual.  Walking can help get rid of the air that was put into your GI tract during the procedure and reduce the bloating. If you had a lower endoscopy (such as a colonoscopy or flexible sigmoidoscopy) you may notice spotting of blood in your stool or on the toilet paper. If you underwent a bowel prep for your procedure, you may not have a normal bowel movement for a few days.  Please Note:  You might notice some irritation and congestion in your nose or some drainage.  This is from the oxygen used during your procedure.  There is no need for concern and it should clear up in a day or so.  SYMPTOMS TO REPORT IMMEDIATELY:   Following lower endoscopy (colonoscopy or flexible sigmoidoscopy):  Excessive amounts of blood in the stool  Significant tenderness or worsening of abdominal pains  Swelling of the abdomen that is new, acute  Fever of 100F or higher  For urgent or emergent issues, a gastroenterologist can be reached at any hour by calling [phone].   DIET:  We do recommend a small meal at first, but then you may proceed to your regular diet.  Drink plenty of fluids but you should avoid alcoholic beverages for 24 hours.  ACTIVITY:  You should plan to take it easy for the rest of today and you should NOT  DRIVE or use heavy machinery until tomorrow (because of the sedation medicines used during the test).    FOLLOW UP: Our staff will call the number listed on your records 48-72 hours following your procedure to check on you and address any questions or concerns that you may have regarding the information given to you following your procedure. If we do not reach you, we will leave a message.  We will attempt to reach you two times.  During this call, we will ask if you have developed any symptoms of COVID 19. If you develop any symptoms (ie: fever, flu-like symptoms, shortness of breath, cough etc.) before then, please call [phone].  If you test positive for Covid 19 in the 2 weeks post procedure, please call and report this information to [place].    If any biopsies were taken you will be contacted by phone or by letter within the next 1-3 weeks.  Please call us at [phone] if you have not heard about the biopsies in 3 weeks.    SIGNATURES/CONFIDENTIALITY: You and/or your care partner have signed paperwork which will be entered into your electronic medical record.  These signatures attest to the fact that that the information above on your After Visit Summary has been reviewed and is understood.  Full responsibility of the confidentiality of this discharge information lies with you and/or your care-partner.

## 2018-11-16 NOTE — Op Note (Signed)
[facility] Patient Name: Lisa Hutchinson Procedure Date: 11/16/2018 8:10 AM MRN: [id number] Endoscopist: Jerene Bears , MD Age: 68 Referring MD:  Date of Birth: March 17, 1951 Gender: Female Account #: [id number] Procedure:                Colonoscopy Indications:              High risk colon cancer surveillance: Personal                            history of multiple (3 or more) adenomas, Last                            colonoscopy 3 years ago Medicines:                Monitored Anesthesia Care Procedure:                Pre-Anesthesia Assessment:                           - Prior to the procedure, a History and Physical                            was performed, and patient medications and                            allergies were reviewed. The patient's tolerance of                            previous anesthesia was also reviewed. The risks                            and benefits of the procedure and the sedation                            options and risks were discussed with the patient.                            All questions were answered, and informed consent                            was obtained. Prior Anticoagulants: The patient has                            taken no previous anticoagulant or antiplatelet                            agents. ASA Grade Assessment: II - A patient with                            mild systemic disease. After reviewing the risks                            and benefits, the patient was deemed in  satisfactory condition to undergo the procedure.                           After obtaining informed consent, the colonoscope                            was passed under direct vision. Throughout the                            procedure, the patient's blood pressure, pulse, and                            oxygen saturations were monitored continuously. The                            Model PCF-[id number]) scope was  introduced                            through the anus and advanced to the the cecum,                            identified by appendiceal orifice and ileocecal                            valve. The colonoscopy was performed without                            difficulty. The patient tolerated the procedure                            well. The quality of the bowel preparation was                            good. The ileocecal valve, appendiceal orifice, and                            rectum were photographed. Scope In: 8:20:20 AM Scope Out: 8:35:50 AM Scope Withdrawal Time: 0 hours 11 minutes 33 seconds  Total Procedure Duration: 0 hours 15 minutes 30 seconds  Findings:                 The digital rectal exam was normal.                           There was a small lipoma, at the hepatic flexure.                           Multiple small and large-mouthed diverticula were                            found in the sigmoid colon, descending colon and                            hepatic flexure.  Internal hemorrhoids were found during                            retroflexion. The hemorrhoids were small.                           The exam was otherwise without abnormality. Complications:            No immediate complications. Estimated Blood Loss:     Estimated blood loss: none. Impression:               - Diverticulosis in the sigmoid colon, in the                            descending colon and at the hepatic flexure.                           - Internal hemorrhoids.                           - The examination was otherwise normal.                           - No specimens collected. Recommendation:           - Patient has a contact number available for                            emergencies. The signs and symptoms of potential                            delayed complications were discussed with the                            patient. Return to normal activities tomorrow.                             Written discharge instructions were provided to the                            patient.                           - Resume previous diet.                           - Continue present medications.                           - Repeat colonoscopy in 5 years for surveillance. Jerene Bears, MD 11/16/2018 8:39:23 AM This report has been signed electronically.

## 2018-11-18 ENCOUNTER — Telehealth: Payer: Self-pay

## 2018-11-18 NOTE — Telephone Encounter (Signed)
  Follow up Call-  Call back number 11/16/2018  Post procedure Call Back phone  # [phone]  Permission to leave phone message Yes  Some recent data might be hidden     Patient questions:  Do you have a fever, pain , or abdominal swelling? No. Pain Score  0 *  Have you tolerated food without any problems? Yes.    Have you been able to return to your normal activities? Yes.    Do you have any questions about your discharge instructions: Diet   No. Medications  No. Follow up visit  No.  Do you have questions or concerns about your Care? No.  Actions: * If pain score is 4 or above: No action needed, pain <4.  1. Have you developed a fever since your procedure? no  2.   Have you had an respiratory symptoms (SOB or cough) since your procedure? no  3.   Have you tested positive for COVID 19 since your procedure no  4.   Have you had any family members/close contacts diagnosed with the COVID 19 since your procedure?  no   If yes to any of these questions please route to Joylene John, RN and Alphonsa Gin, Therapist, sports.

## 2019-04-07 ENCOUNTER — Other Ambulatory Visit: Payer: Self-pay | Admitting: Family Medicine

## 2019-04-07 DIAGNOSIS — Z1231 Encounter for screening mammogram for malignant neoplasm of breast: Secondary | ICD-10-CM

## 2019-05-08 ENCOUNTER — Encounter: Payer: Self-pay | Admitting: Internal Medicine

## 2019-05-23 ENCOUNTER — Encounter: Payer: Self-pay | Admitting: Family Medicine

## 2019-05-23 ENCOUNTER — Other Ambulatory Visit: Payer: Self-pay

## 2019-05-23 ENCOUNTER — Ambulatory Visit (INDEPENDENT_AMBULATORY_CARE_PROVIDER_SITE_OTHER): Payer: Medicare Other | Admitting: Family Medicine

## 2019-05-23 VITALS — BP 124/82 | HR 80 | Temp 97.5°F | Wt 187.6 lb

## 2019-05-23 DIAGNOSIS — Z79899 Other long term (current) drug therapy: Secondary | ICD-10-CM | POA: Diagnosis not present

## 2019-05-23 DIAGNOSIS — Z86010 Personal history of colonic polyps: Secondary | ICD-10-CM

## 2019-05-23 DIAGNOSIS — Z23 Encounter for immunization: Secondary | ICD-10-CM | POA: Diagnosis not present

## 2019-05-23 DIAGNOSIS — I10 Essential (primary) hypertension: Secondary | ICD-10-CM

## 2019-05-23 DIAGNOSIS — E669 Obesity, unspecified: Secondary | ICD-10-CM

## 2019-05-23 MED ORDER — LOSARTAN POTASSIUM-HCTZ 50-12.5 MG PO TABS: 1.0000 | ORAL_TABLET | Freq: Every day | ORAL | 3 refills | Status: DC

## 2019-05-23 NOTE — Patient Instructions (Addendum)
Go to [email or web address] list  when you stop taking the amlodipine start checking your blood pressure weekly and it needs to be taken resting, sitting position with your arm at heart level.  As long as your pressure stays below 130/80 I will leave you alone

## 2019-05-23 NOTE — Progress Notes (Signed)
   Subjective:    Patient ID: Lisa Hutchinson, female    DOB: May 02, 1950, 69 y.o.   MRN: [id number]  HPI She is here for an interval evaluation.  She would also like to get a flu shot.  She does have a history of cataracts and does plan to follow-up with ophthalmology.  Apparently she is not quite ready to have extraction.  Her arthritis causes very little difficulty.  She has had a hysterectomy.  She does get regular mammograms and plans to get a DEXA scan done by her gynecologist.  She continues on amlodipine as well as losartan/HCTZ.  She is interested in potentially reducing the pill load.  She also has a history of colonic polyp with colonoscopy in 2020.  It did show evidence of diverticulosis.  Social history and family history was reviewed.   Review of Systems     Objective:   Physical Exam Alert and in no distress. Tympanic membranes and canals are normal. Pharyngeal area is normal. Neck is supple without adenopathy or thyromegaly. Cardiac exam shows a regular sinus rhythm without murmurs or gallops. Lungs are clear to auscultation. Colonoscopy results were reviewed.       Assessment & Plan:  Essential hypertension - Plan: losartan-hydrochlorothiazide (HYZAAR) 50-12.5 MG tablet, CBC with Differential/Platelet, Comprehensive metabolic panel  Hx of adenomatous colonic polyps  Obesity (BMI 30-39.9)  Need for influenza vaccination - Plan: Flu Vaccine QUAD High Dose(Fluad)  Encounter for long-term (current) use of medications - Plan: CBC with Differential/Platelet, Comprehensive metabolic panel, Lipid panel  Need for vaccination against Streptococcus pneumoniae - Plan: Pneumococcal polysaccharide vaccine 23-valent greater than or equal to 2yo subcutaneous/IM Her immunizations were updated.  Again discussed diet and exercise with her.  Also discussed follow-up on the colonoscopy which she plans to do.  I will have her stop her amlodipine when she finishes the prescription and  continue on losartan/HCTZ.  She will monitor her blood pressure and keep me informed.  She will follow-up with ophthalmology concerning her cataracts.

## 2019-05-24 LAB — CBC WITH DIFFERENTIAL/PLATELET
Basophils Absolute: 0.1 10*3/uL (ref 0.0–0.2)
Basos: 1 %
EOS (ABSOLUTE): 0.3 10*3/uL (ref 0.0–0.4)
Eos: 5 %
Hematocrit: 42.1 % (ref 34.0–46.6)
Hemoglobin: 13.9 g/dL (ref 11.1–15.9)
Immature Grans (Abs): 0 10*3/uL (ref 0.0–0.1)
Immature Granulocytes: 0 %
Lymphocytes Absolute: 2.6 10*3/uL (ref 0.7–3.1)
Lymphs: 42 %
MCH: 28.6 pg (ref 26.6–33.0)
MCHC: 33 g/dL (ref 31.5–35.7)
MCV: 87 fL (ref 79–97)
Monocytes Absolute: 0.7 10*3/uL (ref 0.1–0.9)
Monocytes: 12 %
Neutrophils Absolute: 2.5 10*3/uL (ref 1.4–7.0)
Neutrophils: 40 %
Platelets: 280 10*3/uL (ref 150–450)
RBC: 4.86 x10E6/uL (ref 3.77–5.28)
RDW: 12.1 % (ref 11.7–15.4)
WBC: 6.2 10*3/uL (ref 3.4–10.8)

## 2019-05-24 LAB — LIPID PANEL
Chol/HDL Ratio: 3.3 ratio (ref 0.0–4.4)
Cholesterol, Total: 183 mg/dL (ref 100–199)
HDL: 55 mg/dL (ref >39)
LDL Chol Calc (NIH): 113 mg/dL — ABNORMAL HIGH (ref 0–99)
Triglycerides: 80 mg/dL (ref 0–149)
VLDL Cholesterol Cal: 15 mg/dL (ref 5–40)

## 2019-05-24 LAB — COMPREHENSIVE METABOLIC PANEL
ALT: 5 IU/L (ref 0–32)
AST: 20 IU/L (ref 0–40)
Albumin/Globulin Ratio: 1.9 (ref 1.2–2.2)
Albumin: 4.2 g/dL (ref 3.8–4.8)
Alkaline Phosphatase: 85 IU/L (ref 39–117)
BUN/Creatinine Ratio: 9 — ABNORMAL LOW (ref 12–28)
BUN: 7 mg/dL — ABNORMAL LOW (ref 8–27)
Bilirubin Total: 0.4 mg/dL (ref 0.0–1.2)
CO2: 25 mmol/L (ref 20–29)
Calcium: 9.6 mg/dL (ref 8.7–10.3)
Chloride: 103 mmol/L (ref 96–106)
Creatinine, Ser: 0.76 mg/dL (ref 0.57–1.00)
GFR calc Af Amer: 95 mL/min/{1.73_m2} (ref >59)
GFR calc non Af Amer: 82 mL/min/{1.73_m2} (ref >59)
Globulin, Total: 2.2 g/dL (ref 1.5–4.5)
Glucose: 84 mg/dL (ref 65–99)
Potassium: 4.1 mmol/L (ref 3.5–5.2)
Sodium: 140 mmol/L (ref 134–144)
Total Protein: 6.4 g/dL (ref 6.0–8.5)

## 2019-05-29 DIAGNOSIS — Z01419 Encounter for gynecological examination (general) (routine) without abnormal findings: Secondary | ICD-10-CM | POA: Diagnosis not present

## 2019-05-29 DIAGNOSIS — Z1239 Encounter for other screening for malignant neoplasm of breast: Secondary | ICD-10-CM | POA: Diagnosis not present

## 2019-05-29 DIAGNOSIS — M8589 Other specified disorders of bone density and structure, multiple sites: Secondary | ICD-10-CM | POA: Diagnosis not present

## 2019-05-29 DIAGNOSIS — N952 Postmenopausal atrophic vaginitis: Secondary | ICD-10-CM | POA: Diagnosis not present

## 2019-05-29 DIAGNOSIS — Z13820 Encounter for screening for osteoporosis: Secondary | ICD-10-CM | POA: Diagnosis not present

## 2019-05-29 LAB — HM DEXA SCAN

## 2019-05-30 ENCOUNTER — Encounter: Payer: Federal, State, Local not specified - PPO | Admitting: Family Medicine

## 2019-06-15 ENCOUNTER — Ambulatory Visit: Payer: Federal, State, Local not specified - PPO | Admitting: Family Medicine

## 2019-07-01 DIAGNOSIS — Z23 Encounter for immunization: Secondary | ICD-10-CM | POA: Diagnosis not present

## 2019-07-18 ENCOUNTER — Other Ambulatory Visit: Payer: Federal, State, Local not specified - PPO

## 2019-07-18 DIAGNOSIS — H40013 Open angle with borderline findings, low risk, bilateral: Secondary | ICD-10-CM | POA: Diagnosis not present

## 2019-07-29 DIAGNOSIS — Z23 Encounter for immunization: Secondary | ICD-10-CM | POA: Diagnosis not present

## 2019-08-02 ENCOUNTER — Ambulatory Visit (INDEPENDENT_AMBULATORY_CARE_PROVIDER_SITE_OTHER): Payer: Medicare Other | Admitting: Family Medicine

## 2019-08-02 ENCOUNTER — Encounter: Payer: Self-pay | Admitting: Family Medicine

## 2019-08-02 ENCOUNTER — Other Ambulatory Visit: Payer: Self-pay

## 2019-08-02 VITALS — BP 131/85 | HR 87 | Temp 97.7°F | Wt 191.4 lb

## 2019-08-02 DIAGNOSIS — I10 Essential (primary) hypertension: Secondary | ICD-10-CM

## 2019-08-02 NOTE — Progress Notes (Signed)
   Subjective:    Patient ID: Lisa Hutchinson, female    DOB: Jan 26, 1951, 69 y.o.   MRN: [id number]  HPI She is here for recheck on her blood pressure.  On her last visit we did stop her amlodipine as she was wanting to try and decrease her pill load.  She has been checking her blood pressure at home daily and noted an increase.   Review of Systems     Objective:   Physical Exam Alert and in no distress.  Her blood pressure cuff compared to ours was recorded and is off slightly.       Assessment & Plan:  Essential hypertension Check your blood pressure again in about 2 weeks after you have sat for about 5 minutes with your arm at heart level and then check it again 2 weeks later.  Send me a message and let me know what the numbers are.  If it still elevated then I will add back to amlodipine.  Your machine is slightly different than ours so keep that in mind when you report.  Your machine is off by 8 on the top number and 5 on the bottom.

## 2019-08-02 NOTE — Patient Instructions (Addendum)
Check your blood pressure again in about 2 weeks after you have sat for about 5 minutes with your arm at heart level and then check it again 2 weeks later.  Send me a message and let me know what the numbers are.  If it still elevated then I will add back to amlodipine.  Your machine is slightly different than ours so keep that in mind when you report.  Your machine is off by 8 on the top number and 5 on the bottom.

## 2019-08-21 ENCOUNTER — Encounter: Payer: Self-pay | Admitting: Family Medicine

## 2019-08-22 ENCOUNTER — Institutional Professional Consult (permissible substitution): Payer: Federal, State, Local not specified - PPO | Admitting: Neurology

## 2019-09-17 ENCOUNTER — Encounter: Payer: Self-pay | Admitting: Family Medicine

## 2019-10-04 ENCOUNTER — Other Ambulatory Visit: Payer: Self-pay

## 2019-10-04 ENCOUNTER — Ambulatory Visit
Admission: RE | Admit: 2019-10-04 | Discharge: 2019-10-04 | Disposition: A | Discharge status: Home / Self Care | Payer: Medicare Other | Source: Ambulatory Visit | Attending: Family Medicine | Admitting: Family Medicine

## 2019-10-04 DIAGNOSIS — Z1231 Encounter for screening mammogram for malignant neoplasm of breast: Secondary | ICD-10-CM | POA: Diagnosis not present

## 2019-10-14 ENCOUNTER — Encounter: Payer: Self-pay | Admitting: Family Medicine

## 2019-11-30 ENCOUNTER — Encounter: Payer: Self-pay | Admitting: Family Medicine

## 2019-11-30 ENCOUNTER — Ambulatory Visit (INDEPENDENT_AMBULATORY_CARE_PROVIDER_SITE_OTHER): Payer: Medicare Other | Admitting: Family Medicine

## 2019-11-30 ENCOUNTER — Other Ambulatory Visit: Payer: Self-pay

## 2019-11-30 VITALS — BP 128/88 | HR 75 | Temp 97.0°F | Ht 63.0 in | Wt 191.0 lb

## 2019-11-30 DIAGNOSIS — E669 Obesity, unspecified: Secondary | ICD-10-CM | POA: Diagnosis not present

## 2019-11-30 DIAGNOSIS — N952 Postmenopausal atrophic vaginitis: Secondary | ICD-10-CM

## 2019-11-30 DIAGNOSIS — M1990 Unspecified osteoarthritis, unspecified site: Secondary | ICD-10-CM | POA: Diagnosis not present

## 2019-11-30 DIAGNOSIS — I10 Essential (primary) hypertension: Secondary | ICD-10-CM

## 2019-11-30 DIAGNOSIS — T464X5A Adverse effect of angiotensin-converting-enzyme inhibitors, initial encounter: Secondary | ICD-10-CM

## 2019-11-30 DIAGNOSIS — Z0000 Encounter for general adult medical examination without abnormal findings: Secondary | ICD-10-CM

## 2019-11-30 DIAGNOSIS — R05 Cough: Secondary | ICD-10-CM

## 2019-11-30 DIAGNOSIS — Z86010 Personal history of colonic polyps: Secondary | ICD-10-CM | POA: Diagnosis not present

## 2019-11-30 LAB — COMPREHENSIVE METABOLIC PANEL
ALT: 13 IU/L (ref 0–32)
AST: 29 IU/L (ref 0–40)
Albumin/Globulin Ratio: 1.7 (ref 1.2–2.2)
Albumin: 4.1 g/dL (ref 3.8–4.8)
Alkaline Phosphatase: 87 IU/L (ref 48–121)
BUN/Creatinine Ratio: 14 (ref 12–28)
BUN: 11 mg/dL (ref 8–27)
Bilirubin Total: 0.4 mg/dL (ref 0.0–1.2)
CO2: 25 mmol/L (ref 20–29)
Calcium: 9.6 mg/dL (ref 8.7–10.3)
Chloride: 100 mmol/L (ref 96–106)
Creatinine, Ser: 0.77 mg/dL (ref 0.57–1.00)
GFR calc Af Amer: 92 mL/min/{1.73_m2} (ref >59)
GFR calc non Af Amer: 80 mL/min/{1.73_m2} (ref >59)
Globulin, Total: 2.4 g/dL (ref 1.5–4.5)
Glucose: 82 mg/dL (ref 65–99)
Potassium: 4 mmol/L (ref 3.5–5.2)
Sodium: 137 mmol/L (ref 134–144)
Total Protein: 6.5 g/dL (ref 6.0–8.5)

## 2019-11-30 LAB — LIPID PANEL
Chol/HDL Ratio: 3.3 ratio (ref 0.0–4.4)
Cholesterol, Total: 183 mg/dL (ref 100–199)
HDL: 55 mg/dL (ref >39)
LDL Chol Calc (NIH): 113 mg/dL — ABNORMAL HIGH (ref 0–99)
Triglycerides: 83 mg/dL (ref 0–149)
VLDL Cholesterol Cal: 15 mg/dL (ref 5–40)

## 2019-11-30 LAB — CBC WITH DIFFERENTIAL/PLATELET
Basophils Absolute: 0 10*3/uL (ref 0.0–0.2)
Basos: 1 %
EOS (ABSOLUTE): 0.4 10*3/uL (ref 0.0–0.4)
Eos: 7 %
Hematocrit: 43.6 % (ref 34.0–46.6)
Hemoglobin: 14.2 g/dL (ref 11.1–15.9)
Immature Grans (Abs): 0 10*3/uL (ref 0.0–0.1)
Immature Granulocytes: 0 %
Lymphocytes Absolute: 2.4 10*3/uL (ref 0.7–3.1)
Lymphs: 43 %
MCH: 28.5 pg (ref 26.6–33.0)
MCHC: 32.6 g/dL (ref 31.5–35.7)
MCV: 88 fL (ref 79–97)
Monocytes Absolute: 0.6 10*3/uL (ref 0.1–0.9)
Monocytes: 10 %
Neutrophils Absolute: 2.2 10*3/uL (ref 1.4–7.0)
Neutrophils: 39 %
Platelets: 261 10*3/uL (ref 150–450)
RBC: 4.98 x10E6/uL (ref 3.77–5.28)
RDW: 12.5 % (ref 11.7–15.4)
WBC: 5.6 10*3/uL (ref 3.4–10.8)

## 2019-11-30 MED ORDER — LOSARTAN POTASSIUM-HCTZ 50-12.5 MG PO TABS: 1.0000 | ORAL_TABLET | Freq: Every day | ORAL | 3 refills | Status: DC

## 2019-11-30 NOTE — Progress Notes (Signed)
Lisa Hutchinson is a 69 y.o. female who presents for annual wellness visit,CPE and follow-up on chronic medical conditions.  She has no particular concerns or complaints.  She does have history of colonic polyps and is scheduled for routine follow-up on that.  She also has osteopenia and did have a DEXA.  She is using extra vitamin D and calcium.  She does have some atrophic vaginitis but at this point it is not a major concern.  Her weight is stable over the last several years.  She does exercise regularly.  She does complain of arthritis pains but is not on any medications.  She is semiretired.  She is not involved in any relationship.  Otherwise family and social history is unremarkable Immunizations and Health Maintenance Immunization History  Administered Date(s) Administered  . Fluad Quad(high Dose 65+) 05/23/2019  . Influenza, High Dose Seasonal PF 06/22/2018  . Influenza,inj,Quad PF,6+ Mos 02/21/2013, 01/24/2014  . PFIZER SARS-COV-2 Vaccination 07/01/2019, 07/29/2019  . Pneumococcal Conjugate-13 12/20/2017  . Pneumococcal Polysaccharide-23 05/23/2019  . Tdap 07/18/2012  . Zoster 09/23/2012  . Zoster Recombinat (Shingrix) 07/15/2016, 09/28/2016   Health Maintenance Due  Topic Date Due  . INFLUENZA VACCINE  11/26/2019    Last Pap smear: Aged out  Last mammogram: 10/04/19 Last colonoscopy: 11/16/18 Last DEXA: 05/04/17 Dentist: Orene Desanctis Ophtho:[place] Exercise: walking 45 min a day for 6 days a week   Other doctors caring for patient include: Dr. Charlesetta Garibaldi OB, Dr. Hilarie Fredrickson GI, Dr. Gwynn Burly EYE  Advanced directives:info given Does Patient Have a Medical Advance Directive?: No Would patient like information on creating a medical advance directive?: Yes (MAU/Ambulatory/Procedural Areas - Information given)  Depression screen:  See questionnaire below.  Depression screen [facility] 2/9 11/30/2019 12/20/2017 07/15/2016  Decreased Interest 0 0 0  Down, Depressed, Hopeless 0 0 0  PHQ - 2 Score 0 0 0     Fall Risk Screen: see questionnaire below. Fall Risk  11/30/2019 12/20/2017 07/15/2016  Falls in the past year? 0 Yes No  Number falls in past yr: - 1 -  Injury with Fall? - Yes -  Risk for fall due to : No Fall Risks - -    ADL screen:  See questionnaire below Functional Status Survey: Is the patient deaf or have difficulty hearing?: No Does the patient have difficulty seeing, even when wearing glasses/contacts?: No Does the patient have difficulty concentrating, remembering, or making decisions?: No Does the patient have difficulty walking or climbing stairs?: No Does the patient have difficulty dressing or bathing?: No Does the patient have difficulty doing errands alone such as visiting a doctor's office or shopping?: No   Review of Systems Constitutional: -, -unexpected weight change, -anorexia, -fatigue Allergy: -sneezing, -itching, -congestion Dermatology: denies changing moles, rash, lumps ENT: -runny nose, -ear pain, -sore throat,  Cardiology:  -chest pain, -palpitations, -orthopnea, Respiratory: -cough, -shortness of breath, -dyspnea on exertion, -wheezing,  Gastroenterology: -abdominal pain, -nausea, -vomiting, -diarrhea, -constipation, -dysphagia Hematology: -bleeding or bruising problems Musculoskeletal: -arthralgias, -myalgias, -joint swelling, -back pain, - Ophthalmology: -vision changes,  Urology: -dysuria, -difficulty urinating,  -urinary frequency, -urgency, incontinence Neurology: -, -numbness, , -memory loss, -falls, -dizziness    PHYSICAL EXAM:  BP 128/88   Pulse 75   Temp (!) 97 F (36.1 C)   Ht 5\' 3"  (1.6 m)   Wt 191 lb (86.6 kg)   SpO2 96%   BMI 33.83 kg/m   General Appearance: Alert, cooperative, no distress, appears stated age Head: Normocephalic, without obvious abnormality, atraumatic  Eyes: PERRL, conjunctiva/corneas clear, EOM's intact. Ears: Normal TM's and external ear canals Nose: Nares normal, mucosa normal, no drainage or sinus  tenderness Throat: Lips, mucosa, and tongue normal; teeth and gums normal Neck: Supple, no lymphadenopathy;  thyroid:  no enlargement/tenderness/nodules; no carotid bruit or JVD Lungs: Clear to auscultation bilaterally without wheezes, rales or ronchi; respirations unlabored Heart: Regular rate and rhythm, S1 and S2 normal, no murmur, rubor gallop Abdomen: Soft, non-tender, nondistended, normoactive bowel sounds,  no masses, no hepatosplenomegaly Extremities: No clubbing, cyanosis or edema Pulses: 2+ and symmetric all extremities Skin:  Skin color, texture, turgor normal, no rashes or lesions Lymph nodes: Cervical, supraclavicular, and axillary nodes normal Neurologic:  CNII-XII intact, normal strength, sensation and gait; reflexes 2+ and symmetric throughout Psych: Normal mood, affect, hygiene and grooming.  ASSESSMENT/PLAN: Hx of adenomatous colonic polyps  Essential hypertension  Atrophic vaginitis  Arthritis  Obesity (BMI 30-39.9)  ACE-inhibitor cough Discussed diet and exercise with her especially cutting back on carbohydrates.  She will continue on her present medication regimen.   Discussed  at least 30 minutes of aerobic activity at least 5 days/week and weight-bearing exercise 2x/week;healthy diet, including goals of calcium and vitamin D intake  Immunization recommendations discussed.  Colonoscopy recommendations reviewed   Medicare Attestation I have personally reviewed: The patient's medical and social history Their use of alcohol, tobacco or illicit drugs Their current medications and supplements The patient's functional ability including ADLs,fall risks, home safety risks, cognitive, and hearing and visual impairment Diet and physical activities Evidence for depression or mood disorders  The patient's weight, height, and BMI have been recorded in the chart.  I have made referrals, counseling, and provided education to the patient based on review of the above and  I have provided the patient with a written personalized care plan for preventive services.     Jill Alexanders, MD   11/30/2019

## 2019-11-30 NOTE — Patient Instructions (Signed)
  Lisa Hutchinson , Thank you for taking time to come for your Medicare Wellness Visit. I appreciate your ongoing commitment to your health goals. Please review the following plan we discussed and let me know if I can assist you in the future.   These are the goals we discussed: Work on diet especially cutting back on carbohydrates. This is a list of the screening recommended for you and due dates:  Health Maintenance  Topic Date Due  . Flu Shot  11/26/2019  . Mammogram  10/03/2021  . Tetanus Vaccine  07/19/2022  . Colon Cancer Screening  11/16/2023  . DEXA scan (bone density measurement)  Completed  . COVID-19 Vaccine  Completed  .  Hepatitis C: One time screening is recommended by Center for Disease Control  (CDC) for  adults born from [birth year] through 1965.   Completed  . Pneumonia vaccines  Completed

## 2020-01-18 DIAGNOSIS — H401133 Primary open-angle glaucoma, bilateral, severe stage: Secondary | ICD-10-CM | POA: Diagnosis not present

## 2020-01-25 ENCOUNTER — Other Ambulatory Visit (INDEPENDENT_AMBULATORY_CARE_PROVIDER_SITE_OTHER): Payer: Medicare Other

## 2020-01-25 ENCOUNTER — Other Ambulatory Visit: Payer: Self-pay

## 2020-01-25 DIAGNOSIS — Z23 Encounter for immunization: Secondary | ICD-10-CM | POA: Diagnosis not present

## 2020-02-05 ENCOUNTER — Other Ambulatory Visit: Payer: Self-pay

## 2020-02-05 ENCOUNTER — Ambulatory Visit (INDEPENDENT_AMBULATORY_CARE_PROVIDER_SITE_OTHER): Payer: Medicare Other

## 2020-02-05 DIAGNOSIS — Z23 Encounter for immunization: Secondary | ICD-10-CM

## 2020-02-29 ENCOUNTER — Other Ambulatory Visit: Payer: Self-pay | Admitting: Family Medicine

## 2020-02-29 DIAGNOSIS — Z1231 Encounter for screening mammogram for malignant neoplasm of breast: Secondary | ICD-10-CM

## 2020-04-02 ENCOUNTER — Other Ambulatory Visit: Payer: Self-pay

## 2020-04-02 ENCOUNTER — Encounter: Payer: Self-pay | Admitting: Family Medicine

## 2020-04-02 ENCOUNTER — Ambulatory Visit (INDEPENDENT_AMBULATORY_CARE_PROVIDER_SITE_OTHER): Payer: Medicare Other | Admitting: Family Medicine

## 2020-04-02 VITALS — BP 122/76 | HR 90 | Temp 98.2°F | Wt 196.6 lb

## 2020-04-02 DIAGNOSIS — D229 Melanocytic nevi, unspecified: Secondary | ICD-10-CM

## 2020-04-02 NOTE — Progress Notes (Signed)
   Subjective:    Patient ID: Lisa Hutchinson, female    DOB: Jul 07, 1950, 69 y.o.   MRN: [id number]  HPI She has a mole present on the left flank area that became irritated and bled.  She is here for evaluation and possible treatment.   Review of Systems     Objective:   Physical Exam Exam of left flank /buttock area shows a 1 and half centimeter blackish firm lesion with well-demarcated border.      Assessment & Plan:  Benign mole It looks as if the mole has clotted off and will fall off on its own.  I explained that to her and if it does not, she will return and I will remove it as it is causing some irritation.

## 2020-06-17 DIAGNOSIS — Z01419 Encounter for gynecological examination (general) (routine) without abnormal findings: Secondary | ICD-10-CM | POA: Diagnosis not present

## 2020-07-09 DIAGNOSIS — H401133 Primary open-angle glaucoma, bilateral, severe stage: Secondary | ICD-10-CM | POA: Diagnosis not present

## 2020-10-04 ENCOUNTER — Ambulatory Visit: Payer: Medicare Other

## 2020-10-04 ENCOUNTER — Other Ambulatory Visit: Payer: Self-pay

## 2020-10-04 ENCOUNTER — Ambulatory Visit
Admission: RE | Admit: 2020-10-04 | Discharge: 2020-10-04 | Disposition: A | Discharge status: Home / Self Care | Payer: Medicare Other | Source: Ambulatory Visit | Attending: Family Medicine | Admitting: Family Medicine

## 2020-10-04 DIAGNOSIS — Z1231 Encounter for screening mammogram for malignant neoplasm of breast: Secondary | ICD-10-CM | POA: Diagnosis not present

## 2020-10-21 DIAGNOSIS — H401133 Primary open-angle glaucoma, bilateral, severe stage: Secondary | ICD-10-CM | POA: Diagnosis not present

## 2020-12-03 ENCOUNTER — Encounter: Payer: Self-pay | Admitting: Family Medicine

## 2020-12-03 ENCOUNTER — Ambulatory Visit
Admission: RE | Admit: 2020-12-03 | Discharge: 2020-12-03 | Disposition: A | Discharge status: Home / Self Care | Payer: Medicare Other | Source: Ambulatory Visit | Attending: Family Medicine | Admitting: Family Medicine

## 2020-12-03 ENCOUNTER — Ambulatory Visit (INDEPENDENT_AMBULATORY_CARE_PROVIDER_SITE_OTHER): Payer: Medicare Other | Admitting: Family Medicine

## 2020-12-03 ENCOUNTER — Other Ambulatory Visit: Payer: Self-pay

## 2020-12-03 VITALS — BP 126/84 | HR 78 | Temp 96.7°F | Ht 63.0 in | Wt 192.8 lb

## 2020-12-03 DIAGNOSIS — Z13220 Encounter for screening for lipoid disorders: Secondary | ICD-10-CM | POA: Diagnosis not present

## 2020-12-03 DIAGNOSIS — M8580 Other specified disorders of bone density and structure, unspecified site: Secondary | ICD-10-CM | POA: Diagnosis not present

## 2020-12-03 DIAGNOSIS — Z860101 Personal history of adenomatous and serrated colon polyps: Secondary | ICD-10-CM

## 2020-12-03 DIAGNOSIS — Z83719 Family history of colon polyps, unspecified: Secondary | ICD-10-CM

## 2020-12-03 DIAGNOSIS — M25551 Pain in right hip: Secondary | ICD-10-CM

## 2020-12-03 DIAGNOSIS — M1611 Unilateral primary osteoarthritis, right hip: Secondary | ICD-10-CM | POA: Diagnosis not present

## 2020-12-03 DIAGNOSIS — E669 Obesity, unspecified: Secondary | ICD-10-CM

## 2020-12-03 DIAGNOSIS — Z86010 Personal history of colonic polyps: Secondary | ICD-10-CM

## 2020-12-03 DIAGNOSIS — Z8371 Family history of colonic polyps: Secondary | ICD-10-CM | POA: Diagnosis not present

## 2020-12-03 DIAGNOSIS — I10 Essential (primary) hypertension: Secondary | ICD-10-CM | POA: Diagnosis not present

## 2020-12-03 DIAGNOSIS — H409 Unspecified glaucoma: Secondary | ICD-10-CM | POA: Diagnosis not present

## 2020-12-03 DIAGNOSIS — M1990 Unspecified osteoarthritis, unspecified site: Secondary | ICD-10-CM | POA: Diagnosis not present

## 2020-12-03 MED ORDER — LOSARTAN POTASSIUM-HCTZ 50-12.5 MG PO TABS: 1.0000 | ORAL_TABLET | Freq: Every day | ORAL | 3 refills | Status: DC

## 2020-12-03 NOTE — Progress Notes (Signed)
Lisa Hutchinson is a 70 y.o. female who presents for annual wellness visit and follow-up on chronic medical conditions.  She complains of right hip pain worse in the last month.  No history of fall or overuse.  Review of previous data does show that she has left hip arthritis.  She also has a history of colonic polyps and is scheduled for follow-up colonoscopy in 2025.  She does see her ophthalmologist and does have evidence of glaucoma.  She does have osteopenia and has seen gynecology in the past for this.  The last DEXA was in 2020.  I do not have 1 after that.  She thinks that she might of had 1 recently from her gynecologist.  She continues on losartan/HCTZ for her blood pressure and is having no difficulty with that.  She does not exercise.  She does continue to work part-time and enjoys that. Immunizations and Health Maintenance Immunization History  Administered Date(s) Administered   Fluad Quad(high Dose 65+) 05/23/2019, 01/25/2020   Influenza, High Dose Seasonal PF 06/22/2018   Influenza,inj,Quad PF,6+ Mos 02/21/2013, 01/24/2014   PFIZER(Purple Top)SARS-COV-2 Vaccination 07/01/2019, 07/29/2019, 02/05/2020   Pneumococcal Conjugate-13 12/20/2017   Pneumococcal Polysaccharide-23 05/23/2019   Tdap 07/18/2012   Zoster Recombinat (Shingrix) 07/15/2016, 09/28/2016   Zoster, Live 09/23/2012   Health Maintenance Due  Topic Date Due   COVID-19 Vaccine (4 - Booster for Pfizer series) 05/07/2020   INFLUENZA VACCINE  11/25/2020    Last Pap smear: aged out  Last mammogram: 10/04/20 Last colonoscopy: 11/16/18  Last DEXA: 05/14/17 Dentist: Q six months  Ophtho: Q three month Exercise: walking 30 min a day   Other doctors caring for patient include: Dr. Charlesetta Garibaldi GYN, Dr. Gwynn Burly eye, Dr. Hilarie Fredrickson GI  Advanced directives: Does Patient Have a Medical Advance Directive?: No Would patient like information on creating a medical advance directive?: Yes (ED - Information included in AVS)  Depression  screen:  See questionnaire below.  Depression screen [facility] 2/9 12/03/2020 11/30/2019 12/20/2017 07/15/2016  Decreased Interest 0 0 0 0  Down, Depressed, Hopeless 0 0 0 0  PHQ - 2 Score 0 0 0 0    Fall Risk Screen: see questionnaire below. Fall Risk  12/03/2020 11/30/2019 12/20/2017 07/15/2016  Falls in the past year? 0 0 Yes No  Number falls in past yr: 0 - 1 -  Injury with Fall? 0 - Yes -  Risk for fall due to : No Fall Risks No Fall Risks - -  Follow up Falls evaluation completed - - -    ADL screen:  See questionnaire below Functional Status Survey: Is the patient deaf or have difficulty hearing?: No Does the patient have difficulty seeing, even when wearing glasses/contacts?: No Does the patient have difficulty concentrating, remembering, or making decisions?: No Does the patient have difficulty walking or climbing stairs?: No Does the patient have difficulty dressing or bathing?: No Does the patient have difficulty doing errands alone such as visiting a doctor's office or shopping?: No   Review of Systems Constitutional: -, -unexpected weight change, -anorexia, -fatigue Allergy: -sneezing, -itching, -congestion Dermatology: denies changing moles, rash, lumps ENT: -runny nose, -ear pain, -sore throat,  Cardiology:  -chest pain, -palpitations, -orthopnea, Respiratory: -cough, -shortness of breath, -dyspnea on exertion, -wheezing,  Gastroenterology: -abdominal pain, -nausea, -vomiting, -diarrhea, -constipation, -dysphagia Hematology: -bleeding or bruising problems Musculoskeletal: -arthralgias, -myalgias, -joint swelling, -back pain, - Ophthalmology: -vision changes,  Urology: -dysuria, -difficulty urinating,  -urinary frequency, -urgency, incontinence Neurology: -, -numbness, , -memory loss, -  falls, -dizziness    PHYSICAL EXAM: General Appearance: Alert, cooperative, no distress, appears stated age Head: Normocephalic, without obvious abnormality, atraumatic Eyes: PERRL,  conjunctiva/corneas clear, EOM's intact. Ears: Normal TM's and external ear canals Nose: Nares normal, mucosa normal, no drainage or sinus tenderness Throat: Lips, mucosa, and tongue normal; teeth and gums normal Neck: Supple, no lymphadenopathy;  thyroid:  no enlargement/tenderness/nodules; no carotid bruit or JVD Lungs: Clear to auscultation bilaterally without wheezes, rales or ronchi; respirations unlabored Heart: Regular rate and rhythm, S1 and S2 normal, no murmur, rubor gallop Abdomen: Soft, non-tender, nondistended, normoactive bowel sounds,  no masses, no hepatosplenomegaly Extremities: No clubbing, cyanosis or edema.  Some discomfort on motion of the hip with internal and external rotation but range of motion is noted. Pulses: 2+ and symmetric all extremities Skin:  Skin color, texture, turgor normal, no rashes or lesions Lymph nodes: Cervical, supraclavicular, and axillary nodes normal Neurologic:  CNII-XII intact, normal strength, sensation and gait; reflexes 2+ and symmetric throughout Psych: Normal mood, affect, hygiene and grooming.  ASSESSMENT/PLAN: Right hip pain - Plan: DG Hip Unilat W OR W/O Pelvis 2-3 Views Right  Hx of adenomatous colonic polyps  Primary hypertension - Plan: CBC with Differential/Platelet, Comprehensive metabolic panel  Arthritis  Obesity (BMI 30-39.9)  Family history of colonic polyps  Osteopenia, unspecified location  Screening for lipid disorders - Plan: Lipid panel  Glaucoma, unspecified glaucoma type, unspecified laterality  Essential hypertension - Plan: losartan-hydrochlorothiazide (HYZAAR) 50-12.5 MG tablet We will attempt to get the DEXA from her gynecologist and if not done within the last couple of years I will order it.  Continue on her blood pressure medication.  Strongly encouraged her to get involved in regular exercise program. including goals of calcium and vitamin D intake   Immunization recommendations discussed.   Colonoscopy recommendations reviewed Follow-up on the hip after the x-ray.  Medicare Attestation I have personally reviewed: The patient's medical and social history Their use of alcohol, tobacco or illicit drugs Their current medications and supplements The patient's functional ability including ADLs,fall risks, home safety risks, cognitive, and hearing and visual impairment Diet and physical activities Evidence for depression or mood disorders  The patient's weight, height, and BMI have been recorded in the chart.  I have made referrals, counseling, and provided education to the patient based on review of the above and I have provided the patient with a written personalized care plan for preventive services.     Jill Alexanders, MD   12/03/2020

## 2020-12-04 LAB — COMPREHENSIVE METABOLIC PANEL
ALT: 11 IU/L (ref 0–32)
AST: 25 IU/L (ref 0–40)
Albumin/Globulin Ratio: 1.6 (ref 1.2–2.2)
Albumin: 4.2 g/dL (ref 3.8–4.8)
Alkaline Phosphatase: 78 IU/L (ref 44–121)
BUN/Creatinine Ratio: 11 — ABNORMAL LOW (ref 12–28)
BUN: 8 mg/dL (ref 8–27)
Bilirubin Total: 0.3 mg/dL (ref 0.0–1.2)
CO2: 22 mmol/L (ref 20–29)
Calcium: 9.6 mg/dL (ref 8.7–10.3)
Chloride: 102 mmol/L (ref 96–106)
Creatinine, Ser: 0.7 mg/dL (ref 0.57–1.00)
Globulin, Total: 2.6 g/dL (ref 1.5–4.5)
Glucose: 83 mg/dL (ref 65–99)
Potassium: 3.9 mmol/L (ref 3.5–5.2)
Sodium: 140 mmol/L (ref 134–144)
Total Protein: 6.8 g/dL (ref 6.0–8.5)
eGFR: 94 mL/min/{1.73_m2} (ref >59)

## 2020-12-04 LAB — CBC WITH DIFFERENTIAL/PLATELET
Basophils Absolute: 0 10*3/uL (ref 0.0–0.2)
Basos: 1 %
EOS (ABSOLUTE): 0.4 10*3/uL (ref 0.0–0.4)
Eos: 6 %
Hematocrit: 43 % (ref 34.0–46.6)
Hemoglobin: 13.9 g/dL (ref 11.1–15.9)
Immature Grans (Abs): 0 10*3/uL (ref 0.0–0.1)
Immature Granulocytes: 0 %
Lymphocytes Absolute: 3.3 10*3/uL — ABNORMAL HIGH (ref 0.7–3.1)
Lymphs: 51 %
MCH: 28.7 pg (ref 26.6–33.0)
MCHC: 32.3 g/dL (ref 31.5–35.7)
MCV: 89 fL (ref 79–97)
Monocytes Absolute: 0.7 10*3/uL (ref 0.1–0.9)
Monocytes: 10 %
Neutrophils Absolute: 2.1 10*3/uL (ref 1.4–7.0)
Neutrophils: 32 %
Platelets: 274 10*3/uL (ref 150–450)
RBC: 4.84 x10E6/uL (ref 3.77–5.28)
RDW: 12.4 % (ref 11.7–15.4)
WBC: 6.6 10*3/uL (ref 3.4–10.8)

## 2020-12-04 LAB — LIPID PANEL
Chol/HDL Ratio: 3.9 ratio (ref 0.0–4.4)
Cholesterol, Total: 209 mg/dL — ABNORMAL HIGH (ref 100–199)
HDL: 53 mg/dL (ref >39)
LDL Chol Calc (NIH): 133 mg/dL — ABNORMAL HIGH (ref 0–99)
Triglycerides: 127 mg/dL (ref 0–149)
VLDL Cholesterol Cal: 23 mg/dL (ref 5–40)

## 2020-12-08 ENCOUNTER — Encounter: Payer: Self-pay | Admitting: Emergency Medicine

## 2020-12-08 ENCOUNTER — Other Ambulatory Visit: Payer: Self-pay

## 2020-12-08 ENCOUNTER — Ambulatory Visit
Admission: EM | Admit: 2020-12-08 | Discharge: 2020-12-08 | Disposition: A | Discharge status: Home / Self Care | Payer: Medicare Other | Attending: Internal Medicine | Admitting: Internal Medicine

## 2020-12-08 DIAGNOSIS — Z1152 Encounter for screening for COVID-19: Secondary | ICD-10-CM

## 2020-12-08 DIAGNOSIS — J029 Acute pharyngitis, unspecified: Secondary | ICD-10-CM | POA: Diagnosis not present

## 2020-12-08 DIAGNOSIS — J069 Acute upper respiratory infection, unspecified: Secondary | ICD-10-CM | POA: Diagnosis not present

## 2020-12-08 DIAGNOSIS — Z20822 Contact with and (suspected) exposure to covid-19: Secondary | ICD-10-CM

## 2020-12-08 DIAGNOSIS — H65191 Other acute nonsuppurative otitis media, right ear: Secondary | ICD-10-CM

## 2020-12-08 LAB — POCT RAPID STREP A (OFFICE): Rapid Strep A Screen: NEGATIVE

## 2020-12-08 MED ORDER — AZITHROMYCIN 500 MG PO TABS: 500.0000 mg | ORAL_TABLET | Freq: Every day | ORAL | 0 refills | Status: AC

## 2020-12-08 NOTE — ED Triage Notes (Signed)
Pt sts cough, body aches and nasal congestion x 3 days

## 2020-12-08 NOTE — Discharge Instructions (Addendum)
You are being prescribed azithromycin antibiotic for ear infection and upper respiratory infection.  Rapid strep test was negative in urgent care today.  Throat culture and COVID-19 viral swab are pending.  We will call if these are positive.

## 2020-12-08 NOTE — ED Provider Notes (Signed)
[facility]    CSN: [id number] Arrival date & time: 12/08/20  0934      History   Chief Complaint Chief Complaint  Patient presents with   Cough    HPI Lisa Hutchinson is a 70 y.o. female.   Patient presents with 3-day history of cough, body aches, nasal congestion.  Denies any known fevers or sick contacts.  Denies any chest pain or shortness of breath.  Patient has taken over-the-counter cough medication and Alka-Seltzer cold and flu with no relief of symptoms.   Cough  Past Medical History:  Diagnosis Date   Anemia    with pregnancy    Arthritis    knees   Atrophic vaginitis    Cataract    bilateral   Edema    H/O mammogram 05/03/12   Hyperlipidemia    no medicines    Hypertension    Hypokalemia    Sleep difficulties    sleep study normal, fall 2013    Patient Active Problem List   Diagnosis Date Noted   Glaucoma 12/03/2020   ACE-inhibitor cough 11/30/2019   Osteopenia 12/20/2017   Arthritis 07/15/2015   Obesity (BMI 30-39.9) 07/15/2015   Atrophic vaginitis 08/10/2013   Hypertension 05/27/2011   Family history of colonic polyps 10/22/2010   Hx of adenomatous colonic polyps 10/22/2010    Past Surgical History:  Procedure Laterality Date   ABDOMINAL HYSTERECTOMY     fribroids   BREAST LUMPECTOMY     benign   COLONOSCOPY  09/2010; 2007   tubular adenoma, Dr. Verl Blalock   POLYPECTOMY      OB History   No obstetric history on file.      Home Medications    Prior to Admission medications   Medication Sig Start Date End Date Taking? Authorizing Provider  azithromycin (ZITHROMAX) 500 MG tablet Take 1 tablet (500 mg total) by mouth daily for 5 days. 12/08/20 12/13/20 Yes Odis Luster, FNP  aspirin 81 MG tablet Take 81 mg by mouth daily.      [provider]  CALCIUM PO Take by mouth.    [provider]  latanoprost (XALATAN) 0.005 % ophthalmic solution 1 drop at bedtime.    [provider]   losartan-hydrochlorothiazide (HYZAAR) 50-12.5 MG tablet Take 1 tablet by mouth daily. 12/03/20   Denita Lung, MD    Family History Family History  Problem Relation Age of Onset   Colon polyps Mother 71       adenomatous polyps,ascending mass   Hypertension Mother    Diabetes Mother    Hypertension Father    Diabetes Father    Diabetes Sister    Diabetes Brother    Heart disease Neg Hx    Hyperlipidemia Neg Hx    Colon cancer Neg Hx    Esophageal cancer Neg Hx    Rectal cancer Neg Hx    Stomach cancer Neg Hx     Social History Social History   Tobacco Use   Smoking status: Former   Smokeless tobacco: Never  Scientific laboratory technician Use: Never used  Substance Use Topics   Alcohol use: No    Alcohol/week: 0.0 standard drinks   Drug use: No     Allergies   Lisinopril, Penicillins, and Sulfonamide derivatives   Review of Systems Review of Systems Per HPI  Physical Exam Triage Vital Signs ED Triage Vitals [12/08/20 0957]  Enc Vitals Group     BP 139/85  Pulse Rate (!) 105     Resp 18     Temp 98.9 F (37.2 C)     Temp Source Oral     SpO2 97 %     Weight      Height      Head Circumference      Peak Flow      Pain Score 5     Pain Loc      Pain Edu?      Excl. in [place]?    No data found.  Updated Vital Signs BP 139/85 (BP Location: Left Arm)   Pulse (!) 105   Temp 98.9 F (37.2 C) (Oral)   Resp 18   SpO2 97%   Visual Acuity Right Eye Distance:   Left Eye Distance:   Bilateral Distance:    Right Eye Near:   Left Eye Near:    Bilateral Near:     Physical Exam Constitutional:      General: She is not in acute distress.    Appearance: Normal appearance.  HENT:     Head: Normocephalic and atraumatic.     Right Ear: Ear canal normal. Tympanic membrane is erythematous. Tympanic membrane is not bulging.     Left Ear: Ear canal normal.     Nose: Congestion present.     Mouth/Throat:     Mouth: Mucous membranes are moist.     Pharynx:  Posterior oropharyngeal erythema present.  Eyes:     Extraocular Movements: Extraocular movements intact.     Conjunctiva/sclera: Conjunctivae normal.     Pupils: Pupils are equal, round, and reactive to light.  Cardiovascular:     Rate and Rhythm: Normal rate and regular rhythm.     Pulses: Normal pulses.     Heart sounds: Normal heart sounds.  Pulmonary:     Effort: Pulmonary effort is normal. No respiratory distress.     Breath sounds: Normal breath sounds. No wheezing or rhonchi.  Abdominal:     General: Abdomen is flat. Bowel sounds are normal.     Palpations: Abdomen is soft.  Musculoskeletal:        General: Normal range of motion.     Cervical back: Normal range of motion.  Skin:    General: Skin is warm and dry.  Neurological:     General: No focal deficit present.     Mental Status: She is alert and oriented to person, place, and time. Mental status is at baseline.  Psychiatric:        Mood and Affect: Mood normal.        Behavior: Behavior normal.     UC Treatments / Results  Labs (all labs ordered are listed, but only abnormal results are displayed) Labs Reviewed  NOVEL CORONAVIRUS, NAA  CULTURE, GROUP A STREP [facility])  POCT RAPID STREP A (OFFICE)    EKG   Radiology No results found.  Procedures Procedures (including critical care time)  Medications Ordered in UC Medications - No data to display  Initial Impression / Assessment and Plan / UC Course  I have reviewed the triage vital signs and the nursing notes.  Pertinent labs & imaging results that were available during my care of the patient were reviewed by me and considered in my medical decision making (see chart for details).     Will treat right otitis media and upper respiratory infection with azithromycin antibiotic.  Rapid strep test was negative in urgent care today.  Throat culture and COVID-19  Bashioum are pending.  Patient may take over-the-counter Mucinex or Coricidin HBP in addition to  antibiotic to treat and alleviate symptoms.Discussed strict return precautions. Patient verbalized understanding and is agreeable with plan.  Final Clinical Impressions(s) / UC Diagnoses   Final diagnoses:  Encounter for screening laboratory testing for COVID-19 virus  Other non-recurrent acute nonsuppurative otitis media of right ear  Acute upper respiratory infection  Sore throat     Discharge Instructions      You are being prescribed azithromycin antibiotic for ear infection and upper respiratory infection.  Rapid strep test was negative in urgent care today.  Throat culture and COVID-19 viral swab are pending.  We will call if these are positive.   ED Prescriptions     Medication Sig Dispense Auth. Provider   azithromycin (ZITHROMAX) 500 MG tablet Take 1 tablet (500 mg total) by mouth daily for 5 days. 5 tablet Odis Luster, FNP      PDMP not reviewed this encounter.   Odis Luster, FNP 12/08/20 1047

## 2020-12-11 LAB — CULTURE, GROUP A STREP (THRC)

## 2020-12-13 DIAGNOSIS — Z20822 Contact with and (suspected) exposure to covid-19: Secondary | ICD-10-CM | POA: Diagnosis not present

## 2020-12-15 ENCOUNTER — Ambulatory Visit
Admission: EM | Admit: 2020-12-15 | Discharge: 2020-12-15 | Disposition: A | Discharge status: Other Acute Inpatient Hospital | Payer: Medicare Other

## 2020-12-15 DIAGNOSIS — Z20822 Contact with and (suspected) exposure to covid-19: Secondary | ICD-10-CM

## 2020-12-15 NOTE — ED Triage Notes (Signed)
Pt decided not to be retested

## 2021-01-15 ENCOUNTER — Ambulatory Visit (INDEPENDENT_AMBULATORY_CARE_PROVIDER_SITE_OTHER): Payer: Medicare Other | Admitting: Orthopaedic Surgery

## 2021-01-15 ENCOUNTER — Other Ambulatory Visit: Payer: Self-pay

## 2021-01-15 ENCOUNTER — Ambulatory Visit: Payer: Self-pay

## 2021-01-15 DIAGNOSIS — M25551 Pain in right hip: Secondary | ICD-10-CM | POA: Diagnosis not present

## 2021-01-15 NOTE — Progress Notes (Signed)
Office Visit Note   Patient: Lisa Hutchinson           Date of Birth: 1950/05/16           MRN: [id number] Visit Date: 01/15/2021              Requested by: Denita Lung, MD [place],  [place] [id number] PCP: Denita Lung, MD   Assessment & Plan: Visit Diagnoses:  1. Pain in right hip     Plan: Impression is right hip degenerative joint disease.  We have discussed referral to Dr. Ernestina Patches for right hip cortisone injection under fluoroscopy.  She will follow-up with [place] as needed.  Follow-Up Instructions: Return for f/u with Dr. Ernestina Patches for right hip inj.   Orders:  No orders of the defined types were placed in this encounter.  No orders of the defined types were placed in this encounter.     Procedures: No procedures performed   Clinical Data: No additional findings.   Subjective: Chief Complaint  Patient presents with   Right Hip - New Patient (Initial Visit)    HPI patient is a pleasant 70 year old female who comes in today with right hip pain.  This is been ongoing for the past several years but has worsened over the past 1 to 2 months.  The pain she has is to the groin and radiates to the anterior thigh and into the knee.  Pain is worse with walking.  She has been taking Advil and Tylenol without relief.  No previous cortisone injection to the right hip.  Previous x-rays from August of this year showed mild to moderate degenerative changes.  Review of Systems as detailed in HPI.  All others reviewed and are negative.   Objective: Vital Signs: There were no vitals taken for this visit.  Physical Exam well-developed well-nourished female no acute distress.  Alert and oriented x3.  Ortho Exam right hip exam she is a positive logroll positive FADIR.  Minimal pain with hip flexion.  She is neurovascular intact distally.  Specialty Comments:  No specialty comments available.  Imaging: X-rays reviewed by me in [place] reveal mild to moderate  degenerative changes to the right hip   PMFS History: Patient Active Problem List   Diagnosis Date Noted   Glaucoma 12/03/2020   ACE-inhibitor cough 11/30/2019   Osteopenia 12/20/2017   Arthritis 07/15/2015   Obesity (BMI 30-39.9) 07/15/2015   Atrophic vaginitis 08/10/2013   Hypertension 05/27/2011   Family history of colonic polyps 10/22/2010   Hx of adenomatous colonic polyps 10/22/2010   Past Medical History:  Diagnosis Date   Anemia    with pregnancy    Arthritis    knees   Atrophic vaginitis    Cataract    bilateral   Edema    H/O mammogram 05/03/12   Hyperlipidemia    no medicines    Hypertension    Hypokalemia    Sleep difficulties    sleep study normal, fall 2013    Family History  Problem Relation Age of Onset   Colon polyps Mother 60       adenomatous polyps,ascending mass   Hypertension Mother    Diabetes Mother    Hypertension Father    Diabetes Father    Diabetes Sister    Diabetes Brother    Heart disease Neg Hx    Hyperlipidemia Neg Hx    Colon cancer Neg Hx    Esophageal cancer Neg Hx  Rectal cancer Neg Hx    Stomach cancer Neg Hx     Past Surgical History:  Procedure Laterality Date   ABDOMINAL HYSTERECTOMY     fribroids   BREAST LUMPECTOMY     benign   COLONOSCOPY  09/2010; 2007   tubular adenoma, Dr. Verl Blalock   POLYPECTOMY     Social History   Occupational History   Not on file  Tobacco Use   Smoking status: Former   Smokeless tobacco: Never  Vaping Use   Vaping Use: Never used  Substance and Sexual Activity   Alcohol use: No    Alcohol/week: 0.0 standard drinks   Drug use: No   Sexual activity: Not Currently

## 2021-01-27 ENCOUNTER — Other Ambulatory Visit (INDEPENDENT_AMBULATORY_CARE_PROVIDER_SITE_OTHER): Payer: Medicare Other

## 2021-01-27 ENCOUNTER — Other Ambulatory Visit: Payer: Self-pay

## 2021-01-27 DIAGNOSIS — Z23 Encounter for immunization: Secondary | ICD-10-CM

## 2021-02-03 ENCOUNTER — Ambulatory Visit: Payer: Self-pay

## 2021-02-03 ENCOUNTER — Encounter: Payer: Self-pay | Admitting: Physical Medicine and Rehabilitation

## 2021-02-03 ENCOUNTER — Ambulatory Visit: Payer: Medicare Other

## 2021-02-03 ENCOUNTER — Other Ambulatory Visit: Payer: Medicare Other

## 2021-02-03 ENCOUNTER — Ambulatory Visit (INDEPENDENT_AMBULATORY_CARE_PROVIDER_SITE_OTHER): Payer: Medicare Other | Admitting: Physical Medicine and Rehabilitation

## 2021-02-03 ENCOUNTER — Other Ambulatory Visit: Payer: Self-pay

## 2021-02-03 DIAGNOSIS — M25551 Pain in right hip: Secondary | ICD-10-CM

## 2021-02-03 DIAGNOSIS — H401133 Primary open-angle glaucoma, bilateral, severe stage: Secondary | ICD-10-CM | POA: Diagnosis not present

## 2021-02-03 NOTE — Progress Notes (Signed)
Pt state right hip pain the travel thigh and knee. Pt state walking makes the pain worse. Pt state she takes over the counter pain meds to help ease her pain.  Numeric Pain Rating Scale and Functional Assessment Average Pain 7   In the last MONTH (on 0-10 scale) has pain interfered with the following?  1. General activity like being  able to carry out your everyday physical activities such as walking, climbing stairs, carrying groceries, or moving a chair?  Rating(9)    -BT, -Dye Allergies.

## 2021-02-04 MED ORDER — TRIAMCINOLONE ACETONIDE 40 MG/ML IJ SUSP
60.0000 mg | INTRAMUSCULAR | Status: AC | PRN
  Administered 2021-02-03: 60 mg via INTRA_ARTICULAR

## 2021-02-04 MED ORDER — BUPIVACAINE HCL 0.25 % IJ SOLN
4.0000 mL | INTRAMUSCULAR | Status: AC | PRN
  Administered 2021-02-03: 4 mL via INTRA_ARTICULAR

## 2021-02-04 NOTE — Progress Notes (Signed)
   [name] - 70 y.o. female MRN [id number]  Date of birth: 1950/10/20  Office Visit Note: Visit Date: 02/03/2021 PCP: Denita Lung, MD Referred by: Denita Lung, MD  Subjective: Chief Complaint  Patient presents with   Right Hip - Pain   Right Thigh - Pain   Right Knee - Pain   HPI:  Lisa Hutchinson is a 70 y.o. female who comes in today at the request of Dr. Eduard Roux for planned Right anesthetic hip arthrogram with fluoroscopic guidance.  The patient has failed conservative care including home exercise, medications, time and activity modification.  This injection will be diagnostic and hopefully therapeutic.  Please see requesting physician notes for further details and justification.   ROS Otherwise per HPI.  Assessment & Plan: Visit Diagnoses:    ICD-10-CM   1. Pain in right hip  M25.551 XR C-ARM NO REPORT      Plan: No additional findings.   Meds & Orders: No orders of the defined types were placed in this encounter.   Orders Placed This Encounter  Procedures   Large Joint Inj   XR C-ARM NO REPORT    Follow-up: Return for visit to requesting physician as needed.   Procedures: Large Joint Inj: R hip joint on 02/03/2021 4:15 AM Indications: diagnostic evaluation and pain Details: 22 G 3.5 in needle, fluoroscopy-guided anterior approach  Arthrogram: No  Medications: 4 mL bupivacaine 0.25 %; 60 mg triamcinolone acetonide 40 MG/ML Outcome: tolerated well, no immediate complications  There was excellent flow of contrast producing a partial arthrogram of the hip. The patient did have relief of symptoms during the anesthetic phase of the injection. She did however still ambulate with an antalgic gate with catching sensation. Procedure, treatment alternatives, risks and benefits explained, specific risks discussed. Consent was given by the patient. Immediately prior to procedure a time out was called to verify the correct patient, procedure, equipment,  support staff and site/side marked as required. Patient was prepped and draped in the usual sterile fashion.         Clinical History: No specialty comments available.     Objective:  VS:  HT:    WT:   BMI:     BP:   HR: bpm  TEMP: ( )  RESP:  Physical Exam   Imaging: XR C-ARM NO REPORT  Result Date: 02/03/2021 Please see Notes tab for imaging impression.

## 2021-05-01 ENCOUNTER — Other Ambulatory Visit: Payer: Self-pay

## 2021-05-01 ENCOUNTER — Ambulatory Visit (INDEPENDENT_AMBULATORY_CARE_PROVIDER_SITE_OTHER): Payer: Medicare Other

## 2021-05-01 ENCOUNTER — Encounter: Payer: Self-pay | Admitting: Orthopaedic Surgery

## 2021-05-01 ENCOUNTER — Ambulatory Visit (INDEPENDENT_AMBULATORY_CARE_PROVIDER_SITE_OTHER): Payer: Medicare Other | Admitting: Orthopaedic Surgery

## 2021-05-01 DIAGNOSIS — M1611 Unilateral primary osteoarthritis, right hip: Secondary | ICD-10-CM

## 2021-05-01 MED ORDER — MELOXICAM 7.5 MG PO TABS: 7.5000 mg | ORAL_TABLET | Freq: Two times a day (BID) | ORAL | 2 refills | Status: DC | PRN

## 2021-05-01 NOTE — Progress Notes (Signed)
Office Visit Note   Patient: Lisa Hutchinson           Date of Birth: 26-Apr-1951           MRN: [id number] Visit Date: 05/01/2021              Requested by: Denita Lung, MD [place],  [place] [id number] PCP: Denita Lung, MD   Assessment & Plan: Visit Diagnoses:  1. Primary osteoarthritis of right hip     Plan: [name] returns today for chronic right hip and groin pain.  We saw her couple months ago and she did do a cortisone injection in the right hip with Dr. Ernestina Patches which completely relieved her pain for 3 weeks but unfortunately the pain returned.  At baseline she is very active and walks 2 miles a day but has not been able to do so as a result of the pain.  In terms of other conservative management she has used turmeric, IcyHot, pain patches, topical cream, Tylenol, Aleve, Advil and unfortunately none of these treatments seem to help to a significant degree.  She is very frustrated at this point.  Right hip exam is unchanged.  Impression is advanced right hip DJD.  She has tried significant amount of conservative treatments so far.  I do think that physical therapy may benefit her as well as trial of meloxicam.  We also discussed details of hip replacement surgery and what rehab looks like.  She will think about her options and follow-up as needed.  Follow-Up Instructions: Return if symptoms worsen or fail to improve.   Orders:  Orders Placed This Encounter  Procedures   XR HIP UNILAT W OR W/O PELVIS 2-3 VIEWS RIGHT   Ambulatory referral to Physical Therapy   Meds ordered this encounter  Medications   meloxicam (MOBIC) 7.5 MG tablet    Sig: Take 1 tablet (7.5 mg total) by mouth 2 (two) times daily as needed for pain.    Dispense:  30 tablet    Refill:  2      Procedures: No procedures performed   Clinical Data: No additional findings.   Subjective: Chief Complaint  Patient presents with   Right Hip - Pain    HPI  Review of  Systems  Constitutional: Negative.   HENT: Negative.    Eyes: Negative.   Respiratory: Negative.    Cardiovascular: Negative.   Endocrine: Negative.   Musculoskeletal: Negative.   Neurological: Negative.   Hematological: Negative.   Psychiatric/Behavioral: Negative.    All other systems reviewed and are negative.   Objective: Vital Signs: There were no vitals taken for this visit.  Physical Exam Vitals and nursing note reviewed.  Constitutional:      Appearance: She is well-developed.  Pulmonary:     Effort: Pulmonary effort is normal.  Skin:    General: Skin is warm.     Capillary Refill: Capillary refill takes less than 2 seconds.  Neurological:     Mental Status: She is alert and oriented to person, place, and time.  Psychiatric:        Behavior: Behavior normal.        Thought Content: Thought content normal.        Judgment: Judgment normal.    Ortho Exam  Specialty Comments:  No specialty comments available.  Imaging: XR HIP UNILAT W OR W/O PELVIS 2-3 VIEWS RIGHT  Result Date: 05/01/2021 Significant right hip joint space narrowing consistent with degenerative  joint disease.    PMFS History: Patient Active Problem List   Diagnosis Date Noted   Glaucoma 12/03/2020   ACE-inhibitor cough 11/30/2019   Osteopenia 12/20/2017   Arthritis 07/15/2015   Obesity (BMI 30-39.9) 07/15/2015   Atrophic vaginitis 08/10/2013   Hypertension 05/27/2011   Family history of colonic polyps 10/22/2010   Hx of adenomatous colonic polyps 10/22/2010   Past Medical History:  Diagnosis Date   Anemia    with pregnancy    Arthritis    knees   Atrophic vaginitis    Cataract    bilateral   Edema    H/O mammogram 05/03/12   Hyperlipidemia    no medicines    Hypertension    Hypokalemia    Sleep difficulties    sleep study normal, fall 2013    Family History  Problem Relation Age of Onset   Colon polyps Mother 43       adenomatous polyps,ascending mass   Hypertension  Mother    Diabetes Mother    Hypertension Father    Diabetes Father    Diabetes Sister    Diabetes Brother    Heart disease Neg Hx    Hyperlipidemia Neg Hx    Colon cancer Neg Hx    Esophageal cancer Neg Hx    Rectal cancer Neg Hx    Stomach cancer Neg Hx     Past Surgical History:  Procedure Laterality Date   ABDOMINAL HYSTERECTOMY     fribroids   BREAST LUMPECTOMY     benign   COLONOSCOPY  09/2010; 2007   tubular adenoma, Dr. Verl Blalock   POLYPECTOMY     Social History   Occupational History   Not on file  Tobacco Use   Smoking status: Former   Smokeless tobacco: Never  Vaping Use   Vaping Use: Never used  Substance and Sexual Activity   Alcohol use: No    Alcohol/week: 0.0 standard drinks   Drug use: No   Sexual activity: Not Currently

## 2021-05-05 ENCOUNTER — Ambulatory Visit (INDEPENDENT_AMBULATORY_CARE_PROVIDER_SITE_OTHER): Payer: Medicare Other | Admitting: Family Medicine

## 2021-05-05 ENCOUNTER — Encounter: Payer: Self-pay | Admitting: Family Medicine

## 2021-05-05 ENCOUNTER — Other Ambulatory Visit: Payer: Self-pay

## 2021-05-05 VITALS — BP 142/88 | HR 87 | Temp 98.4°F | Wt 184.4 lb

## 2021-05-05 DIAGNOSIS — M25551 Pain in right hip: Secondary | ICD-10-CM | POA: Diagnosis not present

## 2021-05-05 NOTE — Progress Notes (Signed)
° °  Subjective:    Patient ID: Lisa Hutchinson, female    DOB: 10/20/1950, 71 y.o.   MRN: [id number]  HPI She is here for consult concerning her right hip pain.  She was seen in October by Dr. Erlinda Hong.  She has had hip injections however they do not last very long.  She is now on an NSAID and does get some relief of it.  Hip replacement was suggested to her for this.   Review of Systems     Objective:   Physical Exam Alert and in no distress otherwise not examined       Assessment & Plan:  Right hip pain I discussed the treatment of the hip pain in regard to injections, physical therapy and hip replacement.  Encouraged her to set this up which I think she will plan to do after the tax season.  I explained that I thought after 2 months she should probably be ready to go on her trip to [place].

## 2021-05-07 ENCOUNTER — Other Ambulatory Visit: Payer: Self-pay

## 2021-05-07 ENCOUNTER — Encounter: Payer: Self-pay | Admitting: Physical Therapy

## 2021-05-07 ENCOUNTER — Ambulatory Visit (INDEPENDENT_AMBULATORY_CARE_PROVIDER_SITE_OTHER): Payer: Medicare Other | Admitting: Physical Therapy

## 2021-05-07 DIAGNOSIS — G8929 Other chronic pain: Secondary | ICD-10-CM | POA: Diagnosis not present

## 2021-05-07 DIAGNOSIS — M25651 Stiffness of right hip, not elsewhere classified: Secondary | ICD-10-CM | POA: Diagnosis not present

## 2021-05-07 DIAGNOSIS — M6281 Muscle weakness (generalized): Secondary | ICD-10-CM

## 2021-05-07 DIAGNOSIS — M25551 Pain in right hip: Secondary | ICD-10-CM

## 2021-05-07 DIAGNOSIS — M25561 Pain in right knee: Secondary | ICD-10-CM

## 2021-05-07 NOTE — Patient Instructions (Signed)
Access Code: [email or web address] URL: [email or web address] Date: 05/07/2021 Prepared by: Elsie Ra  Exercises Supine Quadriceps Stretch with Strap on Table - 2 x daily - 6 x weekly - 2-3 reps - 30 hold Supine Hip External Rotation Stretch - 2 x daily - 6 x weekly - 3 sets - 10 reps Supine Bridge - 2 x daily - 6 x weekly - 1-2 sets - 10 reps - 5 hold Sidelying Hip Abduction - 2 x daily - 6 x weekly - 1-2 sets - 10 reps Supine Active Straight Leg Raise - 2 x daily - 6 x weekly - 1 sets - 10 reps Standing Hip Extension with Counter Support - 2 x daily - 6 x weekly - 1-2 sets - 10 reps

## 2021-05-07 NOTE — Therapy (Addendum)
[place], [place], [address] Phone: [phone]   Fax:  [phone]  Physical Therapy Evaluation  Patient Details  Name: Lisa Hutchinson MRN: [id number] Date of Birth: 1950-06-23 Referring Provider (PT): Erlinda Hong, MD   Encounter Date: 05/07/2021   PT End of Session - 05/07/21 1406     Visit Number 1    Number of Visits 12    Date for PT Re-Evaluation 06/18/21    PT Start Time 3976    PT Stop Time 1105    PT Time Calculation (min) 50 min    Activity Tolerance Patient tolerated treatment well;No increased pain    Behavior During Therapy [facility] for tasks assessed/performed             Past Medical History:  Diagnosis Date   Anemia    with pregnancy    Arthritis    knees   Atrophic vaginitis    Cataract    bilateral   Edema    H/O mammogram 05/03/12   Hyperlipidemia    no medicines    Hypertension    Hypokalemia    Sleep difficulties    sleep study normal, fall 2013    Past Surgical History:  Procedure Laterality Date   ABDOMINAL HYSTERECTOMY     fribroids   BREAST LUMPECTOMY     benign   COLONOSCOPY  09/2010; 2007   tubular adenoma, Dr. Verl Blalock   POLYPECTOMY      There were no vitals filed for this visit.    Subjective Assessment - 05/07/21 1321     Subjective Pt. presents with L hip and knee pain described as dull and achey and worse at night time. Pt. has been experiencing pain for several months, but most notably since September.Pt recieved steroid shot in November and pain decreased for a short time followed by an increase in pain after a few weeks. Pt. states pain is 0-1/10 currently and 7-8/10 when the pain is at it's worst. Pain is increased with long periods of walking or standing. Pt. uses a heating pad to help with pain and Meloxicam medication prescribed by their MD. Employed at a [employer], sits most of the day. Pt. attempting physical therapy before considering a Total Hip  Replacement.    Limitations Walking;Standing    Diagnostic tests Rt hip XR shows DJD    Patient Stated Goals Walking and Standing for long periods of times without pain    Pain Onset More than a month ago                [facility] PT Assessment - 05/07/21 0001       Assessment   Medical Diagnosis Rt hip OA    Referring Provider (PT) Erlinda Hong, MD    Onset Date/Surgical Date --   pain since 12/2020   Next MD Visit nothing scheduled      Precautions   Precautions None      Restrictions   Weight Bearing Restrictions No      Balance Screen   Has the patient fallen in the past 6 months No    Has the patient had a decrease in activity level because of a fear of falling?  No    Is the patient reluctant to leave their home because of a fear of falling?  No      Prior Function   Level of Independence Independent    Vocation Part time employment    Vocation Requirements works for  credit union, desk work    Leisure travel      Charity fundraiser Status Within Functional Limits for tasks assessed      Observation/Other Assessments   Focus on Therapeutic Outcomes (FOTO)  63% functional intak, goal is 73%      ROM / Strength   AROM / PROM / Strength Strength;AROM      AROM   AROM Assessment Site Hip    Right/Left Hip Right    Right Hip Extension --   WFL   Right Hip Flexion 95    Right Hip External Rotation  10    Right Hip Internal Rotation  --   WFL   Right Hip ABduction --   [facility]     PROM   Right Hip Flexion 105    Right Hip External Rotation  15      Strength   Right Hip Flexion 4/5    Right Hip Extension 4-/5    Right Hip External Rotation  5/5    Right Hip Internal Rotation 4/5    Right Hip ABduction 3/5    Left Hip Flexion 5/5    Left Hip Extension 4/5    Left Hip External Rotation 5/5    Left Hip Internal Rotation 5/5    Left Hip ABduction 4/5      Flexibility   Soft Tissue Assessment /Muscle Length --   tight hip rotators   Quadriceps Modified  Thomas Test shows tight hip flexors      Transfers   Transfers Independent with all Transfers      Ambulation/Gait   Gait Comments WFL gait independently, slower velocity                        Objective measurements completed on examination: See above findings.                PT Education - 05/07/21 1405     Education Details HEP,PT plan of care    Person(s) Educated Patient    Methods Explanation;Demonstration;Verbal cues;Handout    Comprehension Verbalized understanding;Need further instruction              PT Short Term Goals - 05/07/21 1318       PT SHORT TERM GOAL #1   Title Walk 1 mile without pain    Baseline Painful when walking for longer periods of time    Time 4    Period Weeks    Status New    Target Date 06/07/21      PT SHORT TERM GOAL #2   Title Increase R hip abductor strength to 4/5    Baseline Current R hip abductor strength is 3/5    Time 4    Period Weeks    Status New    Target Date 06/07/21      PT SHORT TERM GOAL #3   Title --    Baseline --               PT Long Term Goals - 05/07/21 1320       PT LONG TERM GOAL #1   Title Walk 2 miles with less than 3/10 pain    Baseline Pain when walking long distances    Time 6    Period Weeks    Status New    Target Date 06/18/21      PT LONG TERM GOAL #2   Title Increase FOTO score to 73%  Baseline FOTO score at 63%    Time 6    Period Weeks    Status New    Target Date 06/18/21      PT LONG TERM GOAL #3   Title Increase Rt hip strength to 4+ all planes    Baseline Current hip abductor strength is 3/5    Time 6    Period Weeks    Status New    Target Date 06/18/21      PT LONG TERM GOAL #4   Title Increase R hip ER PROM to 25 deg. to show improved functional mobility    Time 6    Period Weeks    Status New    Target Date 06/18/21                    Plan - 05/07/21 1304     Clinical Impression Statement Pt. presents with c/c  of Rt hip OA. Pt. has stiffness of R hip and R knee. Pt. presents with limitations in ROM w/ R hip flexion and ER. Also, limitations in strength of R hip w/ flexion, ER, and extension. Tight hip flexor muscles are also noted w/ a modified Thomas test. Pt. provided HEP to address impairments. Skilled PT is necessary to address her functional deficits.    Personal Factors and Comorbidities Comorbidity 2    Comorbidities Rt hip OA,osteopenia    Examination-Activity Limitations Squat;Stand;Locomotion Level    Examination-Participation Restrictions Occupation;Community Activity;Cleaning    Stability/Clinical Decision Making Stable/Uncomplicated    Clinical Decision Making Low    Rehab Potential Good    PT Frequency 2x / week    PT Duration 6 weeks    PT Treatment/Interventions ADLs/Self Care Home Management;Gait training;Functional mobility training;Therapeutic activities;Therapeutic exercise;Manual techniques;Passive range of motion;Cryotherapy;Electrical Stimulation;Moist Heat;Ultrasound;Balance training;Neuromuscular re-education;Stair training;Dry needling;Joint Manipulations    PT Next Visit Plan Follow- up with pt. on HEP edit as needed. Begin exercises focused on strength and ROM of hip flexors, and hip rotators.    Consulted and Agree with Plan of Care Patient             Patient will benefit from skilled therapeutic intervention in order to improve the following deficits and impairments:  Decreased strength, Decreased mobility, Pain, Hypermobility, Impaired flexibility, Decreased range of motion  Visit Diagnosis: Pain in right hip  Stiffness of right hip, not elsewhere classified  Chronic pain of right knee  Muscle weakness (generalized)     Problem List Patient Active Problem List   Diagnosis Date Noted   Glaucoma 12/03/2020   ACE-inhibitor cough 11/30/2019   Osteopenia 12/20/2017   Arthritis 07/15/2015   Obesity (BMI 30-39.9) 07/15/2015   Atrophic vaginitis 08/10/2013    Hypertension 05/27/2011   Family history of colonic polyps 10/22/2010   Hx of adenomatous colonic polyps 28/00/3491    Tomothy Eddins Singer, Student-PT 05/07/2021, 2:45 PM During this treatment session, this physical therapist was present, participating in and directing the treatment.   This note has been reviewed and this clinician agrees with the information provided.  Elsie Ra, PT, DPT 05/07/21 3:21 PM   [facility] [address] [place], [place], [address] Phone: [phone]   Fax:  [phone]  Name: Lisa Hutchinson MRN: [id number] Date of Birth: [birth year]

## 2021-05-12 ENCOUNTER — Other Ambulatory Visit: Payer: Self-pay

## 2021-05-12 ENCOUNTER — Encounter: Payer: Self-pay | Admitting: Rehabilitative and Restorative Service Providers"

## 2021-05-12 ENCOUNTER — Ambulatory Visit (INDEPENDENT_AMBULATORY_CARE_PROVIDER_SITE_OTHER): Payer: Medicare Other | Admitting: Rehabilitative and Restorative Service Providers"

## 2021-05-12 DIAGNOSIS — M25551 Pain in right hip: Secondary | ICD-10-CM

## 2021-05-12 DIAGNOSIS — M6281 Muscle weakness (generalized): Secondary | ICD-10-CM | POA: Diagnosis not present

## 2021-05-12 DIAGNOSIS — M25651 Stiffness of right hip, not elsewhere classified: Secondary | ICD-10-CM | POA: Diagnosis not present

## 2021-05-12 NOTE — Patient Instructions (Signed)
Access Code: [email or web address] URL: [email or web address] Date: 05/12/2021 Prepared by: Scot Jun  Exercises Supine Quadriceps Stretch with Strap on Table - 2 x daily - 6 x weekly - 2-3 reps - 30 hold Supine Hip External Rotation Stretch - 2 x daily - 6 x weekly - 1 sets - 2-3 reps - 30 hold Supine Bridge - 2 x daily - 6 x weekly - 1-2 sets - 10 reps - 5 hold Sidelying Hip Abduction - 2 x daily - 6 x weekly - 1-2 sets - 10 reps Supine Active Straight Leg Raise - 2 x daily - 6 x weekly - 1 sets - 10 reps Standing Hip Extension with Counter Support - 2 x daily - 6 x weekly - 1-2 sets - 10 reps Supine Figure 4 Piriformis Stretch - 2 x daily - 7 x weekly - 1 sets - 2-3 reps - 30 hold

## 2021-05-12 NOTE — Therapy (Signed)
[facility] [address] [place], [place], [address] Phone: [phone]   Fax:  [phone]  Physical Therapy Treatment  Patient Details  Name: Lisa Hutchinson MRN: [id number] Date of Birth: Aug 07, 1950 Referring Provider (PT): Erlinda Hong, MD   Encounter Date: 05/12/2021   PT End of Session - 05/12/21 1428     Visit Number 2    Number of Visits 12    Date for PT Re-Evaluation 06/18/21    PT Start Time 1427    PT Stop Time 1506    PT Time Calculation (min) 39 min    Activity Tolerance Patient tolerated treatment well    Behavior During Therapy [facility] for tasks assessed/performed             Past Medical History:  Diagnosis Date   Anemia    with pregnancy    Arthritis    knees   Atrophic vaginitis    Cataract    bilateral   Edema    H/O mammogram 05/03/12   Hyperlipidemia    no medicines    Hypertension    Hypokalemia    Sleep difficulties    sleep study normal, fall 2013    Past Surgical History:  Procedure Laterality Date   ABDOMINAL HYSTERECTOMY     fribroids   BREAST LUMPECTOMY     benign   COLONOSCOPY  09/2010; 2007   tubular adenoma, Dr. Verl Blalock   POLYPECTOMY      There were no vitals filed for this visit.   Subjective Assessment - 05/12/21 1432     Subjective Pt. indicated she was trying to do the exercise routine, just had some cramping with the leg hanging off one.  Pt. indicated she was walking and felt some after.    Limitations Walking;Standing    Diagnostic tests Rt hip XR shows DJD    Patient Stated Goals Walking and Standing for long periods of times without pain    Currently in Pain? No/denies    Pain Score 0-No pain    Pain Location Hip    Pain Onset More than a month ago                               [facility] Adult PT Treatment/Exercise - 05/12/21 0001       Exercises   Exercises Knee/Hip;Other Exercises    Other Exercises  HEP review c verbal/visual cues for adjustment of  techniques      Knee/Hip Exercises: Stretches   Other Knee/Hip Stretches supine thomas test stretch 30 sec x 3 bilateral    Other Knee/Hip Stretches supine figure 4 pull to and press away 30 sec hold x 3 each, performed bilateral      Knee/Hip Exercises: Aerobic   Nustep Lvl 5 10 mins UE/LE      Knee/Hip Exercises: Seated   Sit to Sand without UE support;10 reps   18 inch table height     Knee/Hip Exercises: Supine   Bridges 10 reps;Both   5 sec hold                    PT Education - 05/12/21 1449     Education Details HEP review    Person(s) Educated Patient    Methods Explanation;Demonstration;Verbal cues;Handout    Comprehension Verbalized understanding;Returned demonstration              PT Short Term Goals - 05/07/21 1318  PT SHORT TERM GOAL #1   Title Walk 1 mile without pain    Baseline Painful when walking for longer periods of time    Time 4    Period Weeks    Status New    Target Date 06/07/21      PT SHORT TERM GOAL #2   Title Increase R hip abductor strength to 4/5    Baseline Current R hip abductor strength is 3/5    Time 4    Period Weeks    Status New    Target Date 06/07/21      PT SHORT TERM GOAL #3   Title --    Baseline --               PT Long Term Goals - 05/07/21 1320       PT LONG TERM GOAL #1   Title Walk 2 miles with less than 3/10 pain    Baseline Pain when walking long distances    Time 6    Period Weeks    Status New    Target Date 06/18/21      PT LONG TERM GOAL #2   Title Increase FOTO score to 73%    Baseline FOTO score at 63%    Time 6    Period Weeks    Status New    Target Date 06/18/21      PT LONG TERM GOAL #3   Title Increase Rt hip strength to 4+ all planes    Baseline Current hip abductor strength is 3/5    Time 6    Period Weeks    Status New    Target Date 06/18/21      PT LONG TERM GOAL #4   Title Increase R hip ER PROM to 25 deg. to show improved functional mobility     Time 6    Period Weeks    Status New    Target Date 06/18/21                   Plan - 05/12/21 1446     Clinical Impression Statement Pt. has noted Rt hip mobility restriction which impair progressive mobility.  Time spent in review of HEP to improve knowledge of techniques for home use.  Pt. may continue to benefit from skilled PT services to address impairments.    Personal Factors and Comorbidities Comorbidity 2    Comorbidities Rt hip OA,osteopenia    Examination-Activity Limitations Squat;Stand;Locomotion Level    Examination-Participation Restrictions Occupation;Community Activity;Cleaning    Stability/Clinical Decision Making Stable/Uncomplicated    Rehab Potential Good    PT Frequency 2x / week    PT Duration 6 weeks    PT Treatment/Interventions ADLs/Self Care Home Management;Gait training;Functional mobility training;Therapeutic activities;Therapeutic exercise;Manual techniques;Passive range of motion;Cryotherapy;Electrical Stimulation;Moist Heat;Ultrasound;Balance training;Neuromuscular re-education;Stair training;Dry needling;Joint Manipulations    PT Next Visit Plan Rt hip joint mobility manual intervention for mobility gains/pain free.    Consulted and Agree with Plan of Care Patient             Patient will benefit from skilled therapeutic intervention in order to improve the following deficits and impairments:  Decreased strength, Decreased mobility, Pain, Hypermobility, Impaired flexibility, Decreased range of motion  Visit Diagnosis: Pain in right hip  Stiffness of right hip, not elsewhere classified  Muscle weakness (generalized)     Problem List Patient Active Problem List   Diagnosis Date Noted   Glaucoma 12/03/2020   ACE-inhibitor cough 11/30/2019  Osteopenia 12/20/2017   Arthritis 07/15/2015   Obesity (BMI 30-39.9) 07/15/2015   Atrophic vaginitis 08/10/2013   Hypertension 05/27/2011   Family history of colonic polyps 10/22/2010   Hx  of adenomatous colonic polyps 10/22/2010    Scot Jun, PT, DPT, OCS, ATC 05/12/21  3:02 PM    [facility] [address] [place], [place], [address] Phone: [phone]   Fax:  [phone]  Name: Lisa Hutchinson MRN: [id number] Date of Birth: 1950-10-01

## 2021-05-16 ENCOUNTER — Encounter: Payer: Self-pay | Admitting: Physical Therapy

## 2021-05-16 ENCOUNTER — Ambulatory Visit (INDEPENDENT_AMBULATORY_CARE_PROVIDER_SITE_OTHER): Payer: Medicare Other | Admitting: Medical

## 2021-05-16 ENCOUNTER — Ambulatory Visit (INDEPENDENT_AMBULATORY_CARE_PROVIDER_SITE_OTHER): Payer: Medicare Other | Admitting: Physical Therapy

## 2021-05-16 ENCOUNTER — Other Ambulatory Visit: Payer: Self-pay

## 2021-05-16 VITALS — BP 130/90 | HR 93 | Wt 181.0 lb

## 2021-05-16 VITALS — BP 161/101 | HR 72

## 2021-05-16 DIAGNOSIS — M6281 Muscle weakness (generalized): Secondary | ICD-10-CM | POA: Diagnosis not present

## 2021-05-16 DIAGNOSIS — G8929 Other chronic pain: Secondary | ICD-10-CM

## 2021-05-16 DIAGNOSIS — M25561 Pain in right knee: Secondary | ICD-10-CM | POA: Diagnosis not present

## 2021-05-16 DIAGNOSIS — I10 Essential (primary) hypertension: Secondary | ICD-10-CM

## 2021-05-16 DIAGNOSIS — M25651 Stiffness of right hip, not elsewhere classified: Secondary | ICD-10-CM

## 2021-05-16 DIAGNOSIS — M25551 Pain in right hip: Secondary | ICD-10-CM | POA: Diagnosis not present

## 2021-05-16 DIAGNOSIS — R350 Frequency of micturition: Secondary | ICD-10-CM | POA: Diagnosis not present

## 2021-05-16 DIAGNOSIS — M25559 Pain in unspecified hip: Secondary | ICD-10-CM

## 2021-05-16 NOTE — Therapy (Signed)
[facility] [address] [place], [place], [address] Phone: [phone]   Fax:  [phone]  Physical Therapy Treatment  Patient Details  Name: Lisa Hutchinson MRN: [id number] Date of Birth: Oct 28, 1950 Referring Provider (PT): Erlinda Hong, MD   Encounter Date: 05/16/2021   PT End of Session - 05/16/21 1105     Visit Number 3    Number of Visits 12    Date for PT Re-Evaluation 06/18/21    PT Start Time 6568    PT Stop Time 1056    PT Time Calculation (min) 41 min    Activity Tolerance Patient tolerated treatment well    Behavior During Therapy [facility] for tasks assessed/performed             Past Medical History:  Diagnosis Date   Anemia    with pregnancy    Arthritis    knees   Atrophic vaginitis    Cataract    bilateral   Edema    H/O mammogram 05/03/12   Hyperlipidemia    no medicines    Hypertension    Hypokalemia    Sleep difficulties    sleep study normal, fall 2013    Past Surgical History:  Procedure Laterality Date   ABDOMINAL HYSTERECTOMY     fribroids   BREAST LUMPECTOMY     benign   COLONOSCOPY  09/2010; 2007   tubular adenoma, Dr. Verl Blalock   POLYPECTOMY      Vitals:   05/16/21 1022  BP: (!) 161/101  Pulse: 72     Subjective Assessment - 05/16/21 1015     Subjective Pt. reported today with 0/10 pain however, complaints of increased blood pressure was noted. She believes her pain medication is affecting her BP, she has a MD appt. this afternoon for this issue. Pt. reports wearing her knee brace has allowed her pain to decrease in the hip. Pt. states she was able to walk 1 mile with friend around neighborhood without hip pain.    Limitations Walking;Standing    Diagnostic tests Rt hip XR shows DJD    Patient Stated Goals Walking and Standing for long periods of times without pain    Pain Onset More than a month ago                [facility] PT Assessment - 05/16/21 0001       Assessment   Medical  Diagnosis Rt hip OA    Referring Provider (PT) Erlinda Hong, MD    Next MD Visit 05/16/20                           [facility] Adult PT Treatment/Exercise - 05/16/21 0001       Knee/Hip Exercises: Stretches   Active Hamstring Stretch 10 seconds;Both    Active Hamstring Stretch Limitations 10 reps ea with strap    Other Knee/Hip Stretches seated    Other Knee/Hip Stretches supine figure 4 pull to and press away 5 sec hold throughout 1 minute , performed bilateral      Knee/Hip Exercises: Aerobic   Nustep held today due to high BP      Knee/Hip Exercises: Standing   Other Standing Knee Exercises 1 UE supporting at countertop, stepping forward while extending the back for hip flexor stretching, bilaterally 2X20 sec ea      Knee/Hip Exercises: Seated   Long Arc Quad AROM;Both;2 sets;10 reps    Long Arc Quad Weight 3 lbs.  Knee/Hip Exercises: Supine   Heel Slides AAROM;Both;1 set;10 reps    Heel Slides Limitations Using stretch strap to stretch further at end range    Bridges Limitations 3 sec hold at top    Bridges with Diona Foley Squeeze Both;2 sets;10 reps;AROM    Knee Extension --    Knee Extension Limitations --      Knee/Hip Exercises: Sidelying   Hip ABduction AROM;Both;2 sets;10 reps    Hip ADduction AROM;Both;2 sets;10 reps                       PT Short Term Goals - 05/16/21 1124       PT SHORT TERM GOAL #1   Title Walk 1 mile without pain    Baseline Able to walk 1 mile w/o pain    Time 4    Period Weeks    Status Achieved    Target Date 06/07/21      PT SHORT TERM GOAL #2   Title Increase R hip abductor strength to 4/5    Baseline Current R hip abductor strength is 3/5    Time 4    Period Weeks    Status New    Target Date 06/07/21               PT Long Term Goals - 05/07/21 1320       PT LONG TERM GOAL #1   Title Walk 2 miles with less than 3/10 pain    Baseline Pain when walking long distances    Time 6    Period Weeks     Status New    Target Date 06/18/21      PT LONG TERM GOAL #2   Title Increase FOTO score to 73%    Baseline FOTO score at 63%    Time 6    Period Weeks    Status New    Target Date 06/18/21      PT LONG TERM GOAL #3   Title Increase Rt hip strength to 4+ all planes    Baseline Current hip abductor strength is 3/5    Time 6    Period Weeks    Status New    Target Date 06/18/21      PT LONG TERM GOAL #4   Title Increase R hip ER PROM to 25 deg. to show improved functional mobility    Time 6    Period Weeks    Status New    Target Date 06/18/21                   Plan - 05/16/21 1106     Clinical Impression Statement Pt. presented today with increased BP measuring 161/101.Nustep and aerobic actvity were deferred today, as to not increase blood pressure further. Instead we focused on hip/knee ROM and strengthening gentle to her tolerance to decrease pain and tighteness and improve functional strength.    Personal Factors and Comorbidities Comorbidity 2    Comorbidities Rt hip OA,osteopenia    Examination-Activity Limitations Squat;Stand;Locomotion Level    Examination-Participation Restrictions Occupation;Community Activity;Cleaning    Stability/Clinical Decision Making Stable/Uncomplicated    Rehab Potential Good    PT Frequency 2x / week    PT Duration 6 weeks    PT Treatment/Interventions ADLs/Self Care Home Management;Gait training;Functional mobility training;Therapeutic activities;Therapeutic exercise;Manual techniques;Passive range of motion;Cryotherapy;Electrical Stimulation;Moist Heat;Ultrasound;Balance training;Neuromuscular re-education;Stair training;Dry needling;Joint Manipulations    PT Next Visit Plan Discuss with pt. about results of  doctor's visit regarding BP, resume aerobic activity if BP is stabalized.    Consulted and Agree with Plan of Care Patient             Patient will benefit from skilled therapeutic intervention in order to improve the  following deficits and impairments:  Decreased strength, Decreased mobility, Pain, Hypermobility, Impaired flexibility, Decreased range of motion  Visit Diagnosis: Pain in right hip  Stiffness of right hip, not elsewhere classified  Muscle weakness (generalized)  Chronic pain of right knee     Problem List Patient Active Problem List   Diagnosis Date Noted   Glaucoma 12/03/2020   ACE-inhibitor cough 11/30/2019   Osteopenia 12/20/2017   Arthritis 07/15/2015   Obesity (BMI 30-39.9) 07/15/2015   Atrophic vaginitis 08/10/2013   Hypertension 05/27/2011   Family history of colonic polyps 10/22/2010   Hx of adenomatous colonic polyps 71/85/5015    Tashianna Broome Singer, Student-PT 05/16/2021, 12:08 PM  [facility] [address] [place], [place], [address] Phone: [phone]   Fax:  [phone]  Name: Lisa Hutchinson MRN: [id number] Date of Birth: [birth year]

## 2021-05-16 NOTE — Progress Notes (Signed)
Subjective:  Lisa Hutchinson is a 71 y.o. female who presents for Chief Complaint  Patient presents with   bp running high    Bp running high. Was seen last week and bp was elevated but Dr. Erlinda Hutchinson has given her medication for hip pain and she thinks that is what is keeping her bp high so she has stopped the medication     Here for concerns for elevated BPs.     Been going to ortho, Dr. Erlinda Hutchinson, advised hip replacement.  She wants to try PT first.  Was started on meloxicam 05/01/21, but thinks this is raising her BPs.    She checks her BP every other day.  For the last few days running 150/94, 148/95, yesterday 150/105.   Checks both arms, consistently elevated recently. This morning 160/101 at PT office.    Stopped meloxicam 5 days ago due to elevated BPs.   No chest pain, no palpations, but head feels a little off, maybe slight dizziness.  No headache.  No SOB, no leg edema.  She also notes that she urinates quite a bit.  She gets up roughly every 2 hours at night to urinate which interferes with her sleep.  She also urinates a lot during the daytime as well.  She was concerned about her fluid pill causing this.  This increased urination has been going on quite a while, months  No other aggravating or relieving factors.    No other c/o.  Past Medical History:  Diagnosis Date   Anemia    with pregnancy    Arthritis    knees   Atrophic vaginitis    Cataract    bilateral   Edema    H/O mammogram 05/03/12   Hyperlipidemia    no medicines    Hypertension    Hypokalemia    Sleep difficulties    sleep study normal, fall 2013   Current Outpatient Medications on File Prior to Visit  Medication Sig Dispense Refill   aspirin 81 MG tablet Take 81 mg by mouth daily.       CALCIUM PO Take by mouth.     latanoprost (XALATAN) 0.005 % ophthalmic solution 1 drop at bedtime.     losartan-hydrochlorothiazide (HYZAAR) 50-12.5 MG tablet Take 1 tablet by mouth daily. 90 tablet 3   meloxicam (MOBIC) 7.5  MG tablet Take 1 tablet (7.5 mg total) by mouth 2 (two) times daily as needed for pain. (Patient not taking: Reported on 05/16/2021) 30 tablet 2   No current facility-administered medications on file prior to visit.    The following portions of the patient's history were reviewed and updated as appropriate: allergies, current medications, past family history, past medical history, past social history, past surgical history and problem list.  ROS Otherwise as in subjective above    Objective: BP 130/90    Pulse 93    Wt 181 lb (82.1 kg)    BMI 32.06 kg/m   BP Readings from Last 3 Encounters:  05/16/21 130/90  05/16/21 (!) 161/101  05/05/21 (!) 142/88    General appearance: alert, no distress, well developed, well nourished Neck: supple, no lymphadenopathy, no thyromegaly, no masses Heart: RRR, normal S1, S2, no murmurs Lungs: CTA bilaterally, no wheezes, rhonchi, or rales Pulses: 2+ radial pulses, 2+ pedal pulses, normal cap refill Ext: no edema CN 2-12 intact, nonfocal exam  EKG Indication elevated blood pressures history of hypertension, rate 84 bpm, PR 186 ms, QRS 84 ms, QTC 463 ms, axis -  22 degrees, normal sinus rhythm, Q in 3, questionable prior infarct, poor R wave progression in precordial leads    Assessment: Encounter Diagnoses  Name Primary?   Elevated systolic blood pressure reading with diagnosis of hypertension Yes   Primary hypertension    Chronic hip pain, unspecified laterality    Urinary frequency      Plan: History of hypertension, elevated blood pressure in the last week We discussed that her readings over several years have looked very stable and normal She only recently has had elevated readings in the past week or so being on new prescription for meloxicam and dealing with worse hip pain.  Likely both of these things are playing a role.  She stopped meloxicam about 5 days ago when she started seeing elevated readings She checks her blood pressures  regularly and will continue to monitor her pressures I told her none of her recent readings were dangerous stroke level readings.  She has no worrisome symptoms.  It could be that the pain is triggering some the elevated readings I gave her the option of monitoring her pressures and come back in with Dr. Redmond Hutchinson next week as she already plans to do.  Or if she was particularly worried she could double up on her dose or she comes back next week.  It is not clear whether this is just transient or a change in her overall hypertension, but leaning towards temporary fluctuations due to pain and NSAID use Continue good water intake, limit salt, she cooks most of her food at home  Urinary frequency-we discussed this issue as well.  She may have overactive bladder.  I will defer this discussion until she comes back in and sees her PCP next week.  I doubt her frequent urination and nocturia is related to the lower dose diuretic she is on.  Lisa Hutchinson was seen today for bp running high.  Diagnoses and all orders for this visit:  Elevated systolic blood pressure reading with diagnosis of hypertension  Primary hypertension  Chronic hip pain, unspecified laterality  Urinary frequency    Follow up: next week with PCP as planned

## 2021-05-19 NOTE — Addendum Note (Signed)
Addended by: Carlena Hurl on: 05/19/2021 06:53 PM   Modules accepted: Orders

## 2021-05-20 ENCOUNTER — Other Ambulatory Visit: Payer: Self-pay

## 2021-05-20 ENCOUNTER — Ambulatory Visit (INDEPENDENT_AMBULATORY_CARE_PROVIDER_SITE_OTHER): Payer: Medicare Other | Admitting: Rehabilitative and Restorative Service Providers"

## 2021-05-20 ENCOUNTER — Ambulatory Visit (INDEPENDENT_AMBULATORY_CARE_PROVIDER_SITE_OTHER): Payer: Medicare Other | Admitting: Family Medicine

## 2021-05-20 ENCOUNTER — Encounter: Payer: Self-pay | Admitting: Family Medicine

## 2021-05-20 ENCOUNTER — Encounter: Payer: Self-pay | Admitting: Rehabilitative and Restorative Service Providers"

## 2021-05-20 VITALS — BP 130/82 | HR 78 | Temp 98.3°F | Wt 178.6 lb

## 2021-05-20 DIAGNOSIS — M25551 Pain in right hip: Secondary | ICD-10-CM | POA: Diagnosis not present

## 2021-05-20 DIAGNOSIS — M6281 Muscle weakness (generalized): Secondary | ICD-10-CM

## 2021-05-20 DIAGNOSIS — G8929 Other chronic pain: Secondary | ICD-10-CM

## 2021-05-20 DIAGNOSIS — M25561 Pain in right knee: Secondary | ICD-10-CM | POA: Diagnosis not present

## 2021-05-20 DIAGNOSIS — M25651 Stiffness of right hip, not elsewhere classified: Secondary | ICD-10-CM

## 2021-05-20 DIAGNOSIS — I10 Essential (primary) hypertension: Secondary | ICD-10-CM | POA: Diagnosis not present

## 2021-05-20 NOTE — Progress Notes (Signed)
° °  Subjective:    Patient ID: Lisa Hutchinson, female    DOB: 07-14-1950, 71 y.o.   MRN: [id number]  HPI  She is here for consult concerning her blood pressure.  She is on blood pressure medication and is also taking meloxicam in preparation for hip replacement in April.  She is concerned about elevated blood pressure because of the meloxicam.  Review of Systems     Objective:   Physical Exam Alert and in no distress.  Blood pressure is recorded.       Assessment & Plan:   Primary hypertension I explained that she is getting benefit from the meloxicam for her hip pain and there does not seem to be any indication of this affecting her blood pressure.  Strongly encouraged her to back off on checking her blood pressure that often.  We can reevaluate this after she has surgery.  Discussed the fact that we are trying to prevent in the long-term adverse events with elevated blood pressure rather than day-to-day week to week.

## 2021-05-20 NOTE — Therapy (Signed)
[facility] Physical Therapy [address] [place], [place], [address] Phone: [phone]   Fax:  [phone]  Physical Therapy Treatment  Patient Details  Name: Lisa Hutchinson MRN: [id number] Date of Birth: 07/13/1950 Referring Provider (PT): Erlinda Hong, MD   Encounter Date: 05/20/2021   PT End of Session - 05/20/21 1427     Visit Number 4    Number of Visits 12    Date for PT Re-Evaluation 06/18/21    PT Start Time 1427    PT Stop Time 1506    PT Time Calculation (min) 39 min    Activity Tolerance Patient tolerated treatment well    Behavior During Therapy [facility] for tasks assessed/performed             Past Medical History:  Diagnosis Date   Anemia    with pregnancy    Arthritis    knees   Atrophic vaginitis    Cataract    bilateral   Edema    H/O mammogram 05/03/12   Hyperlipidemia    no medicines    Hypertension    Hypokalemia    Sleep difficulties    sleep study normal, fall 2013    Past Surgical History:  Procedure Laterality Date   ABDOMINAL HYSTERECTOMY     fribroids   BREAST LUMPECTOMY     benign   COLONOSCOPY  09/2010; 2007   tubular adenoma, Dr. Verl Blalock   POLYPECTOMY      There were no vitals filed for this visit.   Subjective Assessment - 05/20/21 1432     Subjective Pt. indicated feeling good at the moment upon arrival.  Pt. indicated nighttime pain.    Limitations Walking;Standing    Diagnostic tests Rt hip XR shows DJD    Patient Stated Goals Walking and Standing for long periods of times without pain    Currently in Pain? No/denies    Pain Score 7    at worst   Pain Location Leg    Pain Orientation Right    Pain Descriptors / Indicators Aching;Dull    Pain Onset More than a month ago    Pain Frequency Intermittent    Aggravating Factors  nighttime    Pain Relieving Factors heat                               [facility] Adult PT Treatment/Exercise - 05/20/21 0001       Knee/Hip Exercises:  Stretches   Active Hamstring Stretch 10 seconds;5 reps;Both    Other Knee/Hip Stretches supine figure 4 pull to and press away 30 sec hold x 3 each, performed bilateral      Knee/Hip Exercises: Aerobic   Nustep Lvl 5 10 mins UE/LE      Knee/Hip Exercises: Seated   Long Arc Quad Both;2 sets;10 reps    Long Arc Quad Weight 4 lbs.      Knee/Hip Exercises: Sidelying   Hip ABduction Right;15 reps;Left   reviewed for home                      PT Short Term Goals - 05/20/21 1500       PT SHORT TERM GOAL #1   Title Walk 1 mile without pain    Baseline Able to walk 1 mile w/o pain    Time 4    Period Weeks    Status Achieved    Target Date 06/07/21  PT SHORT TERM GOAL #2   Title Increase R hip abductor strength to 4/5    Time 4    Period Weeks    Status On-going    Target Date 06/07/21               PT Long Term Goals - 05/07/21 1320       PT LONG TERM GOAL #1   Title Walk 2 miles with less than 3/10 pain    Baseline Pain when walking long distances    Time 6    Period Weeks    Status New    Target Date 06/18/21      PT LONG TERM GOAL #2   Title Increase FOTO score to 73%    Baseline FOTO score at 63%    Time 6    Period Weeks    Status New    Target Date 06/18/21      PT LONG TERM GOAL #3   Title Increase Rt hip strength to 4+ all planes    Baseline Current hip abductor strength is 3/5    Time 6    Period Weeks    Status New    Target Date 06/18/21      PT LONG TERM GOAL #4   Title Increase R hip ER PROM to 25 deg. to show improved functional mobility    Time 6    Period Weeks    Status New    Target Date 06/18/21                   Plan - 05/20/21 1500     Clinical Impression Statement Pt. indicated improvement in feelings related to blood pressure.  Saw MD this morning (see chart).  Pt. continued to present c Rt hip weakness/mobility deficits that impair functional movement patterns.  Making some gains in symptoms  during day as noted.    Personal Factors and Comorbidities Comorbidity 2    Comorbidities Rt hip OA,osteopenia    Examination-Activity Limitations Squat;Stand;Locomotion Level    Examination-Participation Restrictions Occupation;Community Activity;Cleaning    Stability/Clinical Decision Making Stable/Uncomplicated    Rehab Potential Good    PT Frequency 2x / week    PT Duration 6 weeks    PT Treatment/Interventions ADLs/Self Care Home Management;Gait training;Functional mobility training;Therapeutic activities;Therapeutic exercise;Manual techniques;Passive range of motion;Cryotherapy;Electrical Stimulation;Moist Heat;Ultrasound;Balance training;Neuromuscular re-education;Stair training;Dry needling;Joint Manipulations    PT Next Visit Plan Continue to improve mobility/strength as tolerated.    Consulted and Agree with Plan of Care Patient             Patient will benefit from skilled therapeutic intervention in order to improve the following deficits and impairments:  Decreased strength, Decreased mobility, Pain, Hypermobility, Impaired flexibility, Decreased range of motion  Visit Diagnosis: Pain in right hip  Stiffness of right hip, not elsewhere classified  Muscle weakness (generalized)  Chronic pain of right knee     Problem List Patient Active Problem List   Diagnosis Date Noted   Elevated systolic blood pressure reading with diagnosis of hypertension 05/16/2021   Chronic hip pain 05/16/2021   Urinary frequency 05/16/2021   Glaucoma 12/03/2020   ACE-inhibitor cough 11/30/2019   Osteopenia 12/20/2017   Arthritis 07/15/2015   Obesity (BMI 30-39.9) 07/15/2015   Atrophic vaginitis 08/10/2013   Hypertension 05/27/2011   Family history of colonic polyps 10/22/2010   Hx of adenomatous colonic polyps 10/22/2010    Scot Jun, PT, DPT, OCS, ATC 05/20/21  3:07 PM  [facility] Physical Therapy [address] [place], [place], [address] Phone:  [phone]   Fax:  [phone]  Name: Lisa Hutchinson MRN: [id number] Date of Birth: Feb 02, 1951

## 2021-05-21 ENCOUNTER — Encounter: Payer: Self-pay | Admitting: Family Medicine

## 2021-05-22 ENCOUNTER — Other Ambulatory Visit: Payer: Self-pay

## 2021-05-22 ENCOUNTER — Ambulatory Visit (INDEPENDENT_AMBULATORY_CARE_PROVIDER_SITE_OTHER): Payer: Medicare Other | Admitting: Physical Therapy

## 2021-05-22 ENCOUNTER — Encounter: Payer: Self-pay | Admitting: Physical Therapy

## 2021-05-22 DIAGNOSIS — M25561 Pain in right knee: Secondary | ICD-10-CM

## 2021-05-22 DIAGNOSIS — M25651 Stiffness of right hip, not elsewhere classified: Secondary | ICD-10-CM | POA: Diagnosis not present

## 2021-05-22 DIAGNOSIS — M25551 Pain in right hip: Secondary | ICD-10-CM

## 2021-05-22 DIAGNOSIS — G8929 Other chronic pain: Secondary | ICD-10-CM | POA: Diagnosis not present

## 2021-05-22 DIAGNOSIS — M6281 Muscle weakness (generalized): Secondary | ICD-10-CM | POA: Diagnosis not present

## 2021-05-22 NOTE — Therapy (Signed)
[facility] Physical Therapy [address] [place], [place], [address] Phone: [phone]   Fax:  [phone]  Physical Therapy Treatment  Patient Details  Name: Lisa Hutchinson MRN: [id number] Date of Birth: June 02, 1950 Referring Provider (PT): Erlinda Hong, MD   Encounter Date: 05/22/2021   PT End of Session - 05/22/21 1451     Visit Number 5    Number of Visits 12    Date for PT Re-Evaluation 06/18/21    PT Start Time 1300    PT Stop Time 1345    PT Time Calculation (min) 45 min    Activity Tolerance Patient tolerated treatment well    Behavior During Therapy [facility] for tasks assessed/performed             Past Medical History:  Diagnosis Date   Anemia    with pregnancy    Arthritis    knees   Atrophic vaginitis    Cataract    bilateral   Edema    H/O mammogram 05/03/12   Hyperlipidemia    no medicines    Hypertension    Hypokalemia    Sleep difficulties    sleep study normal, fall 2013    Past Surgical History:  Procedure Laterality Date   ABDOMINAL HYSTERECTOMY     fribroids   BREAST LUMPECTOMY     benign   COLONOSCOPY  09/2010; 2007   tubular adenoma, Dr. Verl Blalock   POLYPECTOMY      There were no vitals filed for this visit.   Subjective Assessment - 05/22/21 1309     Subjective Pt. stated that hip pain has improved, however most of her pain being felt is from the knee and below now. She states the MD is not concerned by her increased blood pressure from her medication (Meloxicam), and has been advised by him to continue taking the medication. Pt. is still considering a THA, dependent on how a few more weeks of physical therapy go.    Limitations Walking;Standing    Diagnostic tests Rt hip XR shows DJD    Patient Stated Goals Walking and Standing for long periods of times without pain    Pain Onset More than a month ago                [facility] PT Assessment - 05/22/21 0001       Assessment   Medical Diagnosis Rt hip OA     Referring Provider (PT) Erlinda Hong, MD    Next MD Visit 05/30/2021                           [facility] Adult PT Treatment/Exercise - 05/22/21 0001       Knee/Hip Exercises: Stretches   Active Hamstring Stretch 10 seconds;Both;3 reps    Active Hamstring Stretch Limitations 30 sec. hold with each      Knee/Hip Exercises: Aerobic   Recumbent Bike Lvl 5 6 minutes seat _____      Knee/Hip Exercises: Standing   Hip Flexion Both;2 sets;10 reps;AROM    Hip Flexion Limitations (Standing Marches, 1 UE support) 3# Ankle weights    Other Standing Knee Exercises Countertop: Hip Abduction 2 UE support, 3# ankle weight 2 x 10. Hip Extension at countertop 2 UE support, 3# ankle weight 2 x 10    Other Standing Knee Exercises Standing against wall with red ball against back, (wall ball squats) 3 x 10      Knee/Hip Exercises: Supine  Bridges Limitations 3 sec hold at top    [employer] with [place] sets;10 reps;AROM      Manual Therapy   Manual Therapy Passive ROM    Manual therapy comments Passive hip flexion, hip abduction, knee flexion                     PT Education - 05/22/21 1458     Education Details Discussed with pt. about THA surgery and recovery time as asked by the pt.              PT Short Term Goals - 05/20/21 1500       PT SHORT TERM GOAL #1   Title Walk 1 mile without pain    Baseline Able to walk 1 mile w/o pain    Time 4    Period Weeks    Status Achieved    Target Date 06/07/21      PT SHORT TERM GOAL #2   Title Increase R hip abductor strength to 4/5    Time 4    Period Weeks    Status On-going    Target Date 06/07/21               PT Long Term Goals - 05/07/21 1320       PT LONG TERM GOAL #1   Title Walk 2 miles with less than 3/10 pain    Baseline Pain when walking long distances    Time 6    Period Weeks    Status New    Target Date 06/18/21      PT LONG TERM GOAL #2   Title Increase FOTO score to 73%     Baseline FOTO score at 63%    Time 6    Period Weeks    Status New    Target Date 06/18/21      PT LONG TERM GOAL #3   Title Increase Rt hip strength to 4+ all planes    Baseline Current hip abductor strength is 3/5    Time 6    Period Weeks    Status New    Target Date 06/18/21      PT LONG TERM GOAL #4   Title Increase R hip ER PROM to 25 deg. to show improved functional mobility    Time 6    Period Weeks    Status New    Target Date 06/18/21                   Plan - 05/22/21 1452     Clinical Impression Statement Pt.tolerated treatment well. Session focused on increasing strength, flexibility, and endurance of the R hip by increasing ankle weights with abd. and ext. and standing hip flexion exercises, as well as facilitating squats with therex ball. Though pt. is showing improvement in mobility and strength of the R hip, there are still limitations that are preventing full functional activity.    Personal Factors and Comorbidities Comorbidity 2    Comorbidities Rt hip OA,osteopenia    Examination-Activity Limitations Squat;Stand;Locomotion Level    Examination-Participation Restrictions Occupation;Community Activity;Cleaning    Stability/Clinical Decision Making Stable/Uncomplicated    Rehab Potential Good    PT Frequency 2x / week    PT Duration 6 weeks    PT Treatment/Interventions ADLs/Self Care Home Management;Gait training;Functional mobility training;Therapeutic activities;Therapeutic exercise;Manual techniques;Passive range of motion;Cryotherapy;Electrical Stimulation;Moist Heat;Ultrasound;Balance training;Neuromuscular re-education;Stair training;Dry needling;Joint Manipulations    PT Next Visit Plan progress exercises  to increase mobility and strength of R LE    Consulted and Agree with Plan of Care Patient             Patient will benefit from skilled therapeutic intervention in order to improve the following deficits and impairments:  Decreased  strength, Decreased mobility, Pain, Hypermobility, Impaired flexibility, Decreased range of motion  Visit Diagnosis: Pain in right hip  Stiffness of right hip, not elsewhere classified  Muscle weakness (generalized)  Chronic pain of right knee     Problem List Patient Active Problem List   Diagnosis Date Noted   Elevated systolic blood pressure reading with diagnosis of hypertension 05/16/2021   Chronic hip pain 05/16/2021   Urinary frequency 05/16/2021   Glaucoma 12/03/2020   ACE-inhibitor cough 11/30/2019   Osteopenia 12/20/2017   Arthritis 07/15/2015   Obesity (BMI 30-39.9) 07/15/2015   Atrophic vaginitis 08/10/2013   Hypertension 05/27/2011   Family history of colonic polyps 10/22/2010   Hx of adenomatous colonic polyps 45/36/4680    Tonianne Fine Singer, Student-PT 05/22/2021, 3:09 PM  [facility] Physical Therapy [address] [place], [place], [address] Phone: [phone]   Fax:  [phone]  Name: EMARIE PAUL MRN: [id number] Date of Birth: 1950-10-10

## 2021-05-26 ENCOUNTER — Encounter: Payer: Self-pay | Admitting: Physical Therapy

## 2021-05-26 ENCOUNTER — Ambulatory Visit (INDEPENDENT_AMBULATORY_CARE_PROVIDER_SITE_OTHER): Payer: Medicare Other | Admitting: Physical Therapy

## 2021-05-26 ENCOUNTER — Other Ambulatory Visit: Payer: Self-pay

## 2021-05-26 DIAGNOSIS — M25651 Stiffness of right hip, not elsewhere classified: Secondary | ICD-10-CM

## 2021-05-26 DIAGNOSIS — G8929 Other chronic pain: Secondary | ICD-10-CM | POA: Diagnosis not present

## 2021-05-26 DIAGNOSIS — M6281 Muscle weakness (generalized): Secondary | ICD-10-CM | POA: Diagnosis not present

## 2021-05-26 DIAGNOSIS — M25551 Pain in right hip: Secondary | ICD-10-CM

## 2021-05-26 DIAGNOSIS — M25561 Pain in right knee: Secondary | ICD-10-CM

## 2021-05-26 NOTE — Therapy (Signed)
[facility] [address] [place], [place], [address] Phone: [phone]   Fax:  [phone]  Physical Therapy Treatment  Patient Details  Name: Lisa Hutchinson MRN: [id number] Date of Birth: 03-02-1951 Referring Provider (PT): Erlinda Hong, MD   Encounter Date: 05/26/2021   PT End of Session - 05/26/21 1409     Visit Number 6    Number of Visits 12    Date for PT Re-Evaluation 06/18/21    PT Start Time 1300    PT Stop Time 3491    PT Time Calculation (min) 45 min    Activity Tolerance Patient tolerated treatment well    Behavior During Therapy [facility] for tasks assessed/performed             Past Medical History:  Diagnosis Date   Anemia    with pregnancy    Arthritis    knees   Atrophic vaginitis    Cataract    bilateral   Edema    H/O mammogram 05/03/12   Hyperlipidemia    no medicines    Hypertension    Hypokalemia    Sleep difficulties    sleep study normal, fall 2013    Past Surgical History:  Procedure Laterality Date   ABDOMINAL HYSTERECTOMY     fribroids   BREAST LUMPECTOMY     benign   COLONOSCOPY  09/2010; 2007   tubular adenoma, Dr. Verl Blalock   POLYPECTOMY      There were no vitals filed for this visit.   Subjective Assessment - 05/26/21 1317     Subjective Pt. states that she is feeling improvement in the R LE. She relays she has purchased 2# ankle weights for home    Limitations Walking;Standing    Diagnostic tests Rt hip XR shows DJD    Patient Stated Goals Walking and Standing for long periods of times without pain    Pain Onset More than a month ago                               [facility] Adult PT Treatment/Exercise - 05/26/21 0001       Knee/Hip Exercises: Stretches   Other Knee/Hip Stretches standing hip flexor stretch 30 sec x3      Knee/Hip Exercises: Aerobic   Nustep L6 X 8 min UE/LE      Knee/Hip Exercises: Standing   Hip Flexion --    Hip Flexion Limitations --     Other Standing Knee Exercises Countertop with UE support with 3# ankle weight 2 x 10 bilat for hip abd, extension, marches, and hamstring curls    Other Standing Knee Exercises Standing against wall with red ball against back, (wall ball squats) 2 x 10      Knee/Hip Exercises: Supine   Bridges Limitations 3 sec hold at top    [employer] with [employer] Both;2 sets;10 reps;AROM    Straight Leg Raises Both;10 reps    Straight Leg Raises Limitations 2#      Knee/Hip Exercises: Sidelying   Hip ABduction Both;10 reps    Hip ABduction Limitations 2#                       PT Short Term Goals - 05/20/21 1500       PT SHORT TERM GOAL #1   Title Walk 1 mile without pain    Baseline Able to walk 1 mile w/o pain  Time 4    Period Weeks    Status Achieved    Target Date 06/07/21      PT SHORT TERM GOAL #2   Title Increase R hip abductor strength to 4/5    Time 4    Period Weeks    Status On-going    Target Date 06/07/21               PT Long Term Goals - 05/07/21 1320       PT LONG TERM GOAL #1   Title Walk 2 miles with less than 3/10 pain    Baseline Pain when walking long distances    Time 6    Period Weeks    Status New    Target Date 06/18/21      PT LONG TERM GOAL #2   Title Increase FOTO score to 73%    Baseline FOTO score at 63%    Time 6    Period Weeks    Status New    Target Date 06/18/21      PT LONG TERM GOAL #3   Title Increase Rt hip strength to 4+ all planes    Baseline Current hip abductor strength is 3/5    Time 6    Period Weeks    Status New    Target Date 06/18/21      PT LONG TERM GOAL #4   Title Increase R hip ER PROM to 25 deg. to show improved functional mobility    Time 6    Period Weeks    Status New    Target Date 06/18/21                   Plan - 05/26/21 1410     Clinical Impression Statement We went over additional exercises she can do with her ankle weights she now has from home. She requested new  print out so I updated her HEP today with these and she showed good understanding. We will continue to work to improve her overall strength and ROM in her hip as tolerated.    Personal Factors and Comorbidities Comorbidity 2    Comorbidities Rt hip OA,osteopenia    Examination-Activity Limitations Squat;Stand;Locomotion Level    Examination-Participation Restrictions Occupation;Community Activity;Cleaning    Stability/Clinical Decision Making Stable/Uncomplicated    Rehab Potential Good    PT Frequency 2x / week    PT Duration 6 weeks    PT Treatment/Interventions ADLs/Self Care Home Management;Gait training;Functional mobility training;Therapeutic activities;Therapeutic exercise;Manual techniques;Passive range of motion;Cryotherapy;Electrical Stimulation;Moist Heat;Ultrasound;Balance training;Neuromuscular re-education;Stair training;Dry needling;Joint Manipulations    PT Next Visit Plan progress exercises to increase mobility and strength of R LE    PT Home Exercise Plan Access Code: [email or web address]    Consulted and Agree with Plan of Care Patient             Patient will benefit from skilled therapeutic intervention in order to improve the following deficits and impairments:  Decreased strength, Decreased mobility, Pain, Hypermobility, Impaired flexibility, Decreased range of motion  Visit Diagnosis: Pain in right hip  Stiffness of right hip, not elsewhere classified  Muscle weakness (generalized)  Chronic pain of right knee     Problem List Patient Active Problem List   Diagnosis Date Noted   Elevated systolic blood pressure reading with diagnosis of hypertension 05/16/2021   Chronic hip pain 05/16/2021   Urinary frequency 05/16/2021   Glaucoma 12/03/2020   ACE-inhibitor cough 11/30/2019   Osteopenia 12/20/2017  Arthritis 07/15/2015   Obesity (BMI 30-39.9) 07/15/2015   Atrophic vaginitis 08/10/2013   Hypertension 05/27/2011   Family history of colonic polyps 10/22/2010    Hx of adenomatous colonic polyps 10/22/2010    Debbe Odea, PT,DPT 05/26/2021, 2:20 PM  [facility] [address] [place], [place], [address] Phone: [phone]   Fax:  [phone]  Name: WILLARD MADRIGAL MRN: [id number] Date of Birth: 05-09-1950

## 2021-05-30 ENCOUNTER — Ambulatory Visit (INDEPENDENT_AMBULATORY_CARE_PROVIDER_SITE_OTHER): Payer: Medicare Other | Admitting: Physical Therapy

## 2021-05-30 ENCOUNTER — Encounter: Payer: Self-pay | Admitting: Physical Therapy

## 2021-05-30 ENCOUNTER — Ambulatory Visit (INDEPENDENT_AMBULATORY_CARE_PROVIDER_SITE_OTHER): Payer: Medicare Other | Admitting: Orthopaedic Surgery

## 2021-05-30 ENCOUNTER — Encounter: Payer: Medicare Other | Admitting: Physical Therapy

## 2021-05-30 ENCOUNTER — Encounter: Payer: Medicare Other | Admitting: Rehabilitative and Restorative Service Providers"

## 2021-05-30 ENCOUNTER — Other Ambulatory Visit: Payer: Self-pay

## 2021-05-30 ENCOUNTER — Encounter: Payer: Self-pay | Admitting: Orthopaedic Surgery

## 2021-05-30 DIAGNOSIS — M25551 Pain in right hip: Secondary | ICD-10-CM

## 2021-05-30 DIAGNOSIS — G8929 Other chronic pain: Secondary | ICD-10-CM | POA: Diagnosis not present

## 2021-05-30 DIAGNOSIS — M6281 Muscle weakness (generalized): Secondary | ICD-10-CM

## 2021-05-30 DIAGNOSIS — M25561 Pain in right knee: Secondary | ICD-10-CM | POA: Diagnosis not present

## 2021-05-30 DIAGNOSIS — M1611 Unilateral primary osteoarthritis, right hip: Secondary | ICD-10-CM

## 2021-05-30 DIAGNOSIS — M25651 Stiffness of right hip, not elsewhere classified: Secondary | ICD-10-CM

## 2021-05-30 NOTE — Progress Notes (Signed)
Office Visit Note   Patient: Lisa Hutchinson           Date of Birth: October 09, 1950           MRN: [id number] Visit Date: 05/30/2021              Requested by: Lisa Lung, MD [place],  [place] [address] PCP: Lisa Lung, MD   Assessment & Plan: Visit Diagnoses:  1. Primary osteoarthritis of right hip     Plan: Impression is end-stage right hip DJD.  At this time conservative treatments have not provided much meaningful relief and she is still significantly limited by this.  Therefore based on her options she has elected to move forward with a right total hip replacement hopefully in March.  Risk benefits rehab recovery prognosis all reviewed with the patient in detail.  Lisa Hutchinson will call the patient to schedule surgery hopefully for March 20 pending medical clearance.  Follow-Up Instructions: No follow-ups on file.   Orders:  No orders of the defined types were placed in this encounter.  No orders of the defined types were placed in this encounter.     Procedures: No procedures performed   Clinical Data: No additional findings.   Subjective: Chief Complaint  Patient presents with   Right Hip - Follow-up    HPI  Lisa Hutchinson is a very pleasant 71 year old female here for evaluation and further discussion and consultation on right hip DJD.  We have been seeing her for the last year or so and we have manage this conservatively with medications and activity modifications and physical therapy which is helping some.  She feels that she still has chronic hip and groin pain despite these treatments.  She tried meloxicam but it did make her blood pressure go away.  She is not really interested in cortisone injection since this is only temporary.  She is very limited by the hip pain.  She does taxes for people.  Review of Systems  Constitutional: Negative.   HENT: Negative.    Eyes: Negative.   Respiratory: Negative.    Cardiovascular: Negative.    Endocrine: Negative.   Musculoskeletal: Negative.   Neurological: Negative.   Hematological: Negative.   Psychiatric/Behavioral: Negative.    All other systems reviewed and are negative.   Objective: Vital Signs: There were no vitals taken for this visit.  Physical Exam Vitals and nursing note reviewed.  Constitutional:      Appearance: She is well-developed.  HENT:     Head: Normocephalic and atraumatic.  Pulmonary:     Effort: Pulmonary effort is normal.  Abdominal:     Palpations: Abdomen is soft.  Musculoskeletal:     Cervical back: Neck supple.  Skin:    General: Skin is warm.     Capillary Refill: Capillary refill takes less than 2 seconds.  Neurological:     Mental Status: She is alert and oriented to person, place, and time.  Psychiatric:        Behavior: Behavior normal.        Thought Content: Thought content normal.        Judgment: Judgment normal.    Ortho Exam  Examination of the right hip shows limited range of motion with severe pain with internal and external rotation.  Pain with logroll and Stinchfield sign.  Lateral hip is nontender.  Specialty Comments:  No specialty comments available.  Imaging: No results found.   PMFS History: Patient Active Problem  List   Diagnosis Date Noted   Elevated systolic blood pressure reading with diagnosis of hypertension 05/16/2021   Chronic hip pain 05/16/2021   Urinary frequency 05/16/2021   Glaucoma 12/03/2020   ACE-inhibitor cough 11/30/2019   Osteopenia 12/20/2017   Arthritis 07/15/2015   Obesity (BMI 30-39.9) 07/15/2015   Atrophic vaginitis 08/10/2013   Hypertension 05/27/2011   Family history of colonic polyps 10/22/2010   Hx of adenomatous colonic polyps 10/22/2010   Past Medical History:  Diagnosis Date   Anemia    with pregnancy    Arthritis    knees   Atrophic vaginitis    Cataract    bilateral   Edema    H/O mammogram 05/03/12   Hyperlipidemia    no medicines    Hypertension     Hypokalemia    Sleep difficulties    sleep study normal, fall 2013    Family History  Problem Relation Age of Onset   Colon polyps Mother 24       adenomatous polyps,ascending mass   Hypertension Mother    Diabetes Mother    Hypertension Father    Diabetes Father    Diabetes Sister    Diabetes Brother    Heart disease Neg Hx    Hyperlipidemia Neg Hx    Colon cancer Neg Hx    Esophageal cancer Neg Hx    Rectal cancer Neg Hx    Stomach cancer Neg Hx     Past Surgical History:  Procedure Laterality Date   ABDOMINAL HYSTERECTOMY     fribroids   BREAST LUMPECTOMY     benign   COLONOSCOPY  09/2010; 2007   tubular adenoma, Lisa Hutchinson   POLYPECTOMY     Social History   Occupational History   Not on file  Tobacco Use   Smoking status: Former   Smokeless tobacco: Never  Vaping Use   Vaping Use: Never used  Substance and Sexual Activity   Alcohol use: No    Alcohol/week: 0.0 standard drinks   Drug use: No   Sexual activity: Not Currently

## 2021-05-30 NOTE — Therapy (Addendum)
[place], [place], [address] Phone: [phone]   Fax:  [phone]  Physical Therapy Treatment  Patient Details  Name: Lisa Hutchinson MRN: [id number] Date of Birth: 1950/06/01 Referring Provider (PT): Erlinda Hong, MD   Encounter Date: 05/30/2021   PT End of Session - 05/30/21 1103     Visit Number 7    Number of Visits 12    Date for PT Re-Evaluation 06/18/21    Authorization Type Medicare    Progress Note Due on Visit 10    PT Start Time 9518    PT Stop Time 1053   Pt. had MD appt. at 57 so left slightly early today   PT Time Calculation (min) 38 min    Activity Tolerance Patient tolerated treatment well    Behavior During Therapy [facility] for tasks assessed/performed             Past Medical History:  Diagnosis Date   Anemia    with pregnancy    Arthritis    knees   Atrophic vaginitis    Cataract    bilateral   Edema    H/O mammogram 05/03/12   Hyperlipidemia    no medicines    Hypertension    Hypokalemia    Sleep difficulties    sleep study normal, fall 2013    Past Surgical History:  Procedure Laterality Date   ABDOMINAL HYSTERECTOMY     fribroids   BREAST LUMPECTOMY     benign   COLONOSCOPY  09/2010; 2007   tubular adenoma, Dr. Verl Blalock   POLYPECTOMY      There were no vitals filed for this visit.   Subjective Assessment - 05/30/21 1017     Subjective Pt. states that the hip and knee are feeling good today. She states that the pain is more throughout the day and does not have pain in the morning.  Pt. notes being able to perform functional task such as putting on shoes etc. more efficiently since beginning therapy.    Limitations Walking;Standing    Diagnostic tests Rt hip XR shows DJD    Patient Stated Goals Walking and Standing for long periods of times without pain    Pain Onset More than a month ago                               [facility] Adult PT Treatment/Exercise  - 05/30/21 0001       Knee/Hip Exercises: Stretches   Active Hamstring Stretch Left;3 reps;30 seconds    Active Hamstring Stretch Limitations supine with stretch strap    ITB Stretch Both;3 reps;30 seconds    ITB Stretch Limitations supine with stretch strap    Other Knee/Hip Stretches standing hip flexor stretch 10 sec x 10      Knee/Hip Exercises: Machines for Strengthening   Cybex Knee Extension DL 2 x 10 15#    Cybex Knee Flexion DL 2 x 10 15#    Cybex Leg Press DL 3 x 10 75#      Knee/Hip Exercises: Standing   Other Standing Knee Exercises Countertop with UE support with 3# ankle weight 2 x 10 bilat for hip marches, and hamstring curls                       PT Short Term Goals - 05/20/21 1500       PT SHORT TERM GOAL #  1   Title Walk 1 mile without pain    Baseline Able to walk 1 mile w/o pain    Time 4    Period Weeks    Status Achieved    Target Date 06/07/21      PT SHORT TERM GOAL #2   Title Increase R hip abductor strength to 4/5    Time 4    Period Weeks    Status On-going    Target Date 06/07/21               PT Long Term Goals - 05/07/21 1320       PT LONG TERM GOAL #1   Title Walk 2 miles with less than 3/10 pain    Baseline Pain when walking long distances    Time 6    Period Weeks    Status New    Target Date 06/18/21      PT LONG TERM GOAL #2   Title Increase FOTO score to 73%    Baseline FOTO score at 63%    Time 6    Period Weeks    Status New    Target Date 06/18/21      PT LONG TERM GOAL #3   Title Increase Rt hip strength to 4+ all planes    Baseline Current hip abductor strength is 3/5    Time 6    Period Weeks    Status New    Target Date 06/18/21      PT LONG TERM GOAL #4   Title Increase R hip ER PROM to 25 deg. to show improved functional mobility    Time 6    Period Weeks    Status New    Target Date 06/18/21                   Plan - 05/30/21 1107     Clinical Impression Statement Pt.  is progressing well with physical therapy.We progressed her exercises today to focus more on strength including introducing the leg press, knee extension and knee flexion machines. She tolerated these additions well and we will continue to progress these exercises to her tolerance for overall strength and ROM improvements.    Personal Factors and Comorbidities Comorbidity 2    Comorbidities Rt hip OA,osteopenia    Examination-Activity Limitations Squat;Stand;Locomotion Level    Examination-Participation Restrictions Occupation;Community Activity;Cleaning    Stability/Clinical Decision Making Stable/Uncomplicated    Rehab Potential Good    PT Frequency 2x / week    PT Duration 6 weeks    PT Treatment/Interventions ADLs/Self Care Home Management;Gait training;Functional mobility training;Therapeutic activities;Therapeutic exercise;Manual techniques;Passive range of motion;Cryotherapy;Electrical Stimulation;Moist Heat;Ultrasound;Balance training;Neuromuscular re-education;Stair training;Dry needling;Joint Manipulations    PT Next Visit Plan Progress exercises to focus on strength of both LE's    PT Home Exercise Plan Access Code: [email or web address]    Consulted and Agree with Plan of Care Patient             Patient will benefit from skilled therapeutic intervention in order to improve the following deficits and impairments:  Decreased strength, Decreased mobility, Pain, Hypermobility, Impaired flexibility, Decreased range of motion  Visit Diagnosis: Pain in right hip  Stiffness of right hip, not elsewhere classified  Muscle weakness (generalized)  Chronic pain of right knee     Problem List Patient Active Problem List   Diagnosis Date Noted   Elevated systolic blood pressure reading with diagnosis of hypertension 05/16/2021   Chronic hip pain 05/16/2021  Urinary frequency 05/16/2021   Glaucoma 12/03/2020   ACE-inhibitor cough 11/30/2019   Osteopenia 12/20/2017   Arthritis 07/15/2015    Obesity (BMI 30-39.9) 07/15/2015   Atrophic vaginitis 08/10/2013   Hypertension 05/27/2011   Family history of colonic polyps 10/22/2010   Hx of adenomatous colonic polyps 40/11/6759    Halil Rentz Singer, Student-PT 05/30/2021, [facility] [address] [place], [place], [address] Phone: [phone]   Fax:  [phone]  Name: Lisa Hutchinson MRN: [id number] Date of Birth: 06-08-1950

## 2021-06-02 ENCOUNTER — Telehealth: Payer: Self-pay | Admitting: Family Medicine

## 2021-06-02 NOTE — Telephone Encounter (Signed)
Called and left message with pt that she needs a appt before dr Redmond [employer] will fill out surgical clearance form put the form in the brown folder

## 2021-06-03 ENCOUNTER — Other Ambulatory Visit: Payer: Self-pay

## 2021-06-03 ENCOUNTER — Ambulatory Visit (INDEPENDENT_AMBULATORY_CARE_PROVIDER_SITE_OTHER): Payer: Medicare Other | Admitting: Rehabilitative and Restorative Service Providers"

## 2021-06-03 ENCOUNTER — Encounter: Payer: Self-pay | Admitting: Rehabilitative and Restorative Service Providers"

## 2021-06-03 DIAGNOSIS — M6281 Muscle weakness (generalized): Secondary | ICD-10-CM | POA: Diagnosis not present

## 2021-06-03 DIAGNOSIS — M25561 Pain in right knee: Secondary | ICD-10-CM

## 2021-06-03 DIAGNOSIS — G8929 Other chronic pain: Secondary | ICD-10-CM | POA: Diagnosis not present

## 2021-06-03 DIAGNOSIS — M25651 Stiffness of right hip, not elsewhere classified: Secondary | ICD-10-CM | POA: Diagnosis not present

## 2021-06-03 DIAGNOSIS — M25551 Pain in right hip: Secondary | ICD-10-CM

## 2021-06-03 NOTE — Therapy (Signed)
[facility] [address] [place], [place], [address] Phone: [phone]   Fax:  [phone]  Physical Therapy Treatment  Patient Details  Name: Lisa Hutchinson MRN: [id number] Date of Birth: Jan 06, 1951 Referring Provider (PT): Erlinda Hong, MD   Encounter Date: 06/03/2021   PT End of Session - 06/03/21 1205     Visit Number 8    Number of Visits 12    Date for PT Re-Evaluation 06/18/21    Authorization Type Medicare    Progress Note Due on Visit 10    PT Start Time 1142    PT Stop Time 1222    PT Time Calculation (min) 40 min    Activity Tolerance Patient tolerated treatment well    Behavior During Therapy [facility] for tasks assessed/performed             Past Medical History:  Diagnosis Date   Anemia    with pregnancy    Arthritis    knees   Atrophic vaginitis    Cataract    bilateral   Edema    H/O mammogram 05/03/12   Hyperlipidemia    no medicines    Hypertension    Hypokalemia    Sleep difficulties    sleep study normal, fall 2013    Past Surgical History:  Procedure Laterality Date   ABDOMINAL HYSTERECTOMY     fribroids   BREAST LUMPECTOMY     benign   COLONOSCOPY  09/2010; 2007   tubular adenoma, Dr. Verl Blalock   POLYPECTOMY      There were no vitals filed for this visit.   Subjective Assessment - 06/03/21 1148     Subjective Pt. indicated no pain today.  Pt. indicated she has been doing much better since start of treatment.  Pt. indicated he has been doing well enough to think about pushing off any surgery as long as she stays better.    Limitations Walking;Standing    Diagnostic tests Rt hip XR shows DJD    Patient Stated Goals Walking and Standing for long periods of times without pain    Currently in Pain? No/denies    Pain Score --    Pain Onset --                [facility] PT Assessment - 06/03/21 0001       Assessment   Medical Diagnosis Rt hip OA    Referring Provider (PT) Erlinda Hong, MD      Functional  Tests   Functional tests Single leg stance      Single Leg Stance   Comments Rt 10 seconds, Lt 13 seconds                           [facility] Adult PT Treatment/Exercise - 06/03/21 0001       Neuro Re-ed    Neuro Re-ed Details  tandem stance 1 min x 1 bilateral on floor, SLS 30 sec x 3 bilateral, fitter board fwd/back 30 x each way   occasional hand assist required     Exercises   Other Exercises  Cues for gym based activity that we performed in clinic today      Knee/Hip Exercises: Aerobic   Nustep Lvl 6 10 mins UE/LE      Knee/Hip Exercises: Machines for Strengthening   Cybex Knee Extension DL 2 x 10 15#    Cybex Knee Flexion DL 2 x 10 15#    Cybex  Leg Press DL 3 x 10 75#      Knee/Hip Exercises: Standing   Other Standing Knee Exercises Countertop with UE support with 3# ankle weight  x 10 bilat for hip marches, and hamstring curls                       PT Short Term Goals - 06/03/21 1159       PT SHORT TERM GOAL #1   Title Walk 1 mile without pain    Baseline Able to walk 1 mile w/o pain    Time 4    Period Weeks    Status Achieved    Target Date 06/07/21      PT SHORT TERM GOAL #2   Title Increase R hip abductor strength to 4/5    Time 4    Period Weeks    Target Date 06/07/21               PT Long Term Goals - 06/03/21 1200       PT LONG TERM GOAL #1   Title Walk 2 miles with less than 3/10 pain    Time 6    Period Weeks    Status On-going    Target Date 06/18/21      PT LONG TERM GOAL #2   Title Increase FOTO score to 73%    Time 6    Period Weeks    Status On-going    Target Date 06/18/21      PT LONG TERM GOAL #3   Title Increase Rt hip strength to 4+ all planes    Time 6    Period Weeks    Status New    Target Date 06/18/21      PT LONG TERM GOAL #4   Title Increase R hip ER PROM to 25 deg. to show improved functional mobility    Time 6    Period Weeks    Status On-going    Target Date 06/18/21                    Plan - 06/03/21 1200     Clinical Impression Statement Spent time today reviewing discharge planning as symptoms and presentation continue to improve.  Encouraged use of HEP and incorporation of gym based activity to promote continued activity.  Pt. to benefit from continued skilled PT services c transitioning focus towards HEP as able.    Personal Factors and Comorbidities Comorbidity 2    Comorbidities Rt hip OA,osteopenia    Examination-Activity Limitations Squat;Stand;Locomotion Level    Examination-Participation Restrictions Occupation;Community Activity;Cleaning    Stability/Clinical Decision Making Stable/Uncomplicated    Rehab Potential Good    PT Frequency 2x / week    PT Duration 6 weeks    PT Treatment/Interventions ADLs/Self Care Home Management;Gait training;Functional mobility training;Therapeutic activities;Therapeutic exercise;Manual techniques;Passive range of motion;Cryotherapy;Electrical Stimulation;Moist Heat;Ultrasound;Balance training;Neuromuscular re-education;Stair training;Dry needling;Joint Manipulations    PT Next Visit Plan Strengthening, gym based exercise education.    PT Home Exercise Plan Access Code: [email or web address]    Consulted and Agree with Plan of Care Patient             Patient will benefit from skilled therapeutic intervention in order to improve the following deficits and impairments:  Decreased strength, Decreased mobility, Pain, Hypermobility, Impaired flexibility, Decreased range of motion  Visit Diagnosis: Pain in right hip  Stiffness of right hip, not elsewhere classified  Muscle weakness (generalized)  Chronic pain  of right knee     Problem List Patient Active Problem List   Diagnosis Date Noted   Elevated systolic blood pressure reading with diagnosis of hypertension 05/16/2021   Chronic hip pain 05/16/2021   Urinary frequency 05/16/2021   Glaucoma 12/03/2020   ACE-inhibitor cough 11/30/2019   Osteopenia  12/20/2017   Arthritis 07/15/2015   Obesity (BMI 30-39.9) 07/15/2015   Atrophic vaginitis 08/10/2013   Hypertension 05/27/2011   Family history of colonic polyps 10/22/2010   Hx of adenomatous colonic polyps 10/22/2010    Scot Jun, PT, DPT, OCS, ATC 06/03/21  12:22 PM    [facility] [address] [place], [place], [address] Phone: [phone]   Fax:  [phone]  Name: Lisa Hutchinson MRN: [id number] Date of Birth: 1951-01-30

## 2021-06-05 ENCOUNTER — Institutional Professional Consult (permissible substitution): Payer: Medicare Other | Admitting: Family Medicine

## 2021-06-05 ENCOUNTER — Encounter: Payer: Medicare Other | Admitting: Rehabilitative and Restorative Service Providers"

## 2021-06-10 ENCOUNTER — Encounter: Payer: Self-pay | Admitting: Physical Therapy

## 2021-06-10 ENCOUNTER — Ambulatory Visit (INDEPENDENT_AMBULATORY_CARE_PROVIDER_SITE_OTHER): Payer: Medicare Other | Admitting: Physical Therapy

## 2021-06-10 ENCOUNTER — Other Ambulatory Visit: Payer: Self-pay

## 2021-06-10 DIAGNOSIS — M6281 Muscle weakness (generalized): Secondary | ICD-10-CM | POA: Diagnosis not present

## 2021-06-10 DIAGNOSIS — M25551 Pain in right hip: Secondary | ICD-10-CM

## 2021-06-10 DIAGNOSIS — M25651 Stiffness of right hip, not elsewhere classified: Secondary | ICD-10-CM | POA: Diagnosis not present

## 2021-06-10 DIAGNOSIS — M25561 Pain in right knee: Secondary | ICD-10-CM

## 2021-06-10 DIAGNOSIS — G8929 Other chronic pain: Secondary | ICD-10-CM | POA: Diagnosis not present

## 2021-06-10 NOTE — Therapy (Signed)
[facility] Physical Therapy [address] [place], [place], [address] Phone: [phone]   Fax:  [phone]  Physical Therapy Treatment/ Discharge PHYSICAL THERAPY DISCHARGE SUMMARY  Visits from Start of Care: 9  Current functional level related to goals / functional outcomes: See below   Remaining deficits: See below   Education / Equipment: See below  Plan: Patient agrees to discharge.  Patient goals were met. Patient is being discharged due to meeting the stated rehab goals.        Patient Details  Name: Lisa Hutchinson MRN: [id number] Date of Birth: [birth year] Referring Provider (PT): Erlinda Hong, MD   Encounter Date: 06/10/2021   PT End of Session - 06/10/21 1013     Visit Number 9    Number of Visits 12    Date for PT Re-Evaluation 06/18/21    Authorization Type Medicare    Progress Note Due on Visit 10    PT Start Time 0930    PT Stop Time 1009    PT Time Calculation (min) 39 min    Activity Tolerance Patient tolerated treatment well    Behavior During Therapy [facility] for tasks assessed/performed             Past Medical History:  Diagnosis Date   Anemia    with pregnancy    Arthritis    knees   Atrophic vaginitis    Cataract    bilateral   Edema    H/O mammogram 05/03/12   Hyperlipidemia    no medicines    Hypertension    Hypokalemia    Sleep difficulties    sleep study normal, fall 2013    Past Surgical History:  Procedure Laterality Date   ABDOMINAL HYSTERECTOMY     fribroids   BREAST LUMPECTOMY     benign   COLONOSCOPY  09/2010; 2007   tubular adenoma, Dr. Verl Blalock   POLYPECTOMY      There were no vitals filed for this visit.   Subjective Assessment - 06/10/21 0939     Subjective Pt. states that she is feeling good and only has pain in the R knee towards the end of doing activities throughout the day. She feels she has made a lot of progress with physical therapy and that she feels confident she can maintain  this progress independently with her HEP. She states she feels ready to DC from PT today.    Diagnostic tests Rt hip XR shows DJD    Patient Stated Goals Walking and Standing for long periods of times without pain                [facility] PT Assessment - 06/10/21 0001       Assessment   Medical Diagnosis Rt hip OA    Referring Provider (PT) Erlinda Hong, MD      AROM   Right Hip Flexion 120    Right Hip External Rotation  30      Strength   Right Hip Flexion 5/5    Right Hip Extension 4/5    Right Hip External Rotation  5/5    Right Hip Internal Rotation 5/5    Right Hip ABduction 4+/5                           [facility] Adult PT Treatment/Exercise - 06/10/21 0001       Knee/Hip Exercises: Aerobic   Nustep Lvl 6 8 mins UE/LE  PT Education - 06/10/21 1012     Education Details Discussed pt. HEP and some information regarding THA    Person(s) Educated Patient    Methods Explanation;Handout    Comprehension Verbalized understanding              PT Short Term Goals - 06/10/21 0952       PT SHORT TERM GOAL #1   Title Walk 1 mile without pain    Baseline Able to walk 1 mile w/o pain    Time 4    Period Weeks    Status Achieved    Target Date 06/07/21      PT SHORT TERM GOAL #2   Title Increase R hip abductor strength to 4/5    Time 4    Period Weeks    Status Achieved    Target Date 06/07/21               PT Long Term Goals - 06/10/21 0953       PT LONG TERM GOAL #1   Title Walk 2 miles with less than 3/10 pain    Time 6    Period Weeks    Status Partially Met    Target Date 06/18/21      PT LONG TERM GOAL #2   Title Increase FOTO score to 73%    Baseline 77%    Time 6    Period Weeks    Status Achieved    Target Date 06/18/21      PT LONG TERM GOAL #3   Title Increase Rt hip strength to 4+ all planes    Time 6    Period Weeks    Status Achieved    Target Date 06/18/21      PT LONG TERM GOAL #4    Title Increase R hip ER PROM to 25 deg. to show improved functional mobility    Time 6    Period Weeks    Status Achieved    Target Date 06/18/21                   Plan - 06/10/21 1014     Clinical Impression Statement Session consisted of reassessing pt's R hip measurements as well as, long-term goals including R knee ROM, strength, walking and FOTO goals. Pt. still shows slight limitations in R hip flexion ROM and extension strength. Pt.'s HEP was updated today, to prioritize these mild remaining deficits. PT and pt. believe continued progression can be made with independent HEP plan. Pt. will be discharged from physical therapy today. Pt. had no further questions regarding DC.    Personal Factors and Comorbidities Comorbidity 2    Comorbidities Rt hip OA,osteopenia    Examination-Activity Limitations Squat;Stand;Locomotion Level    Examination-Participation Restrictions Occupation;Community Activity;Cleaning    Stability/Clinical Decision Making Stable/Uncomplicated    Rehab Potential Good    PT Frequency 2x / week    PT Duration 6 weeks    PT Treatment/Interventions ADLs/Self Care Home Management;Gait training;Functional mobility training;Therapeutic activities;Therapeutic exercise;Manual techniques;Passive range of motion;Cryotherapy;Electrical Stimulation;Moist Heat;Ultrasound;Balance training;Neuromuscular re-education;Stair training;Dry needling;Joint Manipulations    PT Next Visit Plan DC today    PT Home Exercise Plan Access Code: [email or web address]    Consulted and Agree with Plan of Care Patient             Patient will benefit from skilled therapeutic intervention in order to improve the following deficits and impairments:  Decreased strength, Decreased mobility, Pain, Hypermobility, Impaired flexibility,  Decreased range of motion  Visit Diagnosis: Pain in right hip  Stiffness of right hip, not elsewhere classified  Muscle weakness (generalized)  Chronic pain of  right knee     Problem List Patient Active Problem List   Diagnosis Date Noted   Elevated systolic blood pressure reading with diagnosis of hypertension 05/16/2021   Chronic hip pain 05/16/2021   Urinary frequency 05/16/2021   Glaucoma 12/03/2020   ACE-inhibitor cough 11/30/2019   Osteopenia 12/20/2017   Arthritis 07/15/2015   Obesity (BMI 30-39.9) 07/15/2015   Atrophic vaginitis 08/10/2013   Hypertension 05/27/2011   Family history of colonic polyps 10/22/2010   Hx of adenomatous colonic polyps 43/73/5789    Liah Morr Singer, Student-PT 06/10/2021, 10:55 AM  [facility] [address] [place], [place], [address] Phone: [phone]   Fax:  [phone]  Name: Lisa Hutchinson MRN: [id number] Date of Birth: 07/04/1950

## 2021-06-12 ENCOUNTER — Encounter: Payer: Medicare Other | Admitting: Physical Therapy

## 2021-06-13 ENCOUNTER — Encounter: Payer: Medicare Other | Admitting: Physical Therapy

## 2021-06-16 ENCOUNTER — Encounter: Payer: Medicare Other | Admitting: Physical Therapy

## 2021-06-16 ENCOUNTER — Institutional Professional Consult (permissible substitution): Payer: Medicare Other | Admitting: Family Medicine

## 2021-06-16 ENCOUNTER — Other Ambulatory Visit: Payer: Self-pay

## 2021-06-17 ENCOUNTER — Other Ambulatory Visit: Payer: Self-pay

## 2021-06-17 ENCOUNTER — Ambulatory Visit (INDEPENDENT_AMBULATORY_CARE_PROVIDER_SITE_OTHER): Payer: Medicare Other | Admitting: Family Medicine

## 2021-06-17 ENCOUNTER — Encounter: Payer: Self-pay | Admitting: Family Medicine

## 2021-06-17 VITALS — BP 146/88 | HR 78 | Temp 96.5°F | Wt 178.4 lb

## 2021-06-17 DIAGNOSIS — Z01818 Encounter for other preprocedural examination: Secondary | ICD-10-CM | POA: Diagnosis not present

## 2021-06-17 DIAGNOSIS — G8929 Other chronic pain: Secondary | ICD-10-CM | POA: Diagnosis not present

## 2021-06-17 DIAGNOSIS — I10 Essential (primary) hypertension: Secondary | ICD-10-CM

## 2021-06-17 DIAGNOSIS — M25551 Pain in right hip: Secondary | ICD-10-CM | POA: Diagnosis not present

## 2021-06-17 NOTE — Progress Notes (Signed)
° °  Subjective:    Patient ID: Lisa Hutchinson, female    DOB: [birth year], 71 y.o.   MRN: [id number]  HPI She is here for preoperative evaluation prior to right hip arthroplasty.  Scheduled for March 20.  She does have hypertension and is presently taking Hyzaar and having no difficulty with that.  She does have a trip planned to [place] in June.  She has had no chest pain, shortness of breath, PND or DOE.   Review of Systems     Objective:   Physical Exam Alert and in no distress. Tympanic membranes and canals are normal. Pharyngeal area is normal. Neck is supple without adenopathy or thyromegaly. Cardiac exam shows a regular sinus rhythm without murmurs or gallops. Lungs are clear to auscultation. EKG from January compared to her previous EKG showed no  changes.  Possible old MI but since it has been well over 10 years no further intervention needed.       Assessment & Plan:  Preoperative general physical examination  Primary hypertension  Chronic pain of right hip

## 2021-06-18 ENCOUNTER — Institutional Professional Consult (permissible substitution): Payer: Medicare Other | Admitting: Family Medicine

## 2021-06-18 DIAGNOSIS — M1611 Unilateral primary osteoarthritis, right hip: Secondary | ICD-10-CM | POA: Diagnosis not present

## 2021-07-02 DIAGNOSIS — Z13820 Encounter for screening for osteoporosis: Secondary | ICD-10-CM | POA: Diagnosis not present

## 2021-07-02 DIAGNOSIS — M810 Age-related osteoporosis without current pathological fracture: Secondary | ICD-10-CM | POA: Diagnosis not present

## 2021-07-02 DIAGNOSIS — E559 Vitamin D deficiency, unspecified: Secondary | ICD-10-CM | POA: Diagnosis not present

## 2021-07-02 DIAGNOSIS — Z01419 Encounter for gynecological examination (general) (routine) without abnormal findings: Secondary | ICD-10-CM | POA: Diagnosis not present

## 2021-07-02 DIAGNOSIS — M8580 Other specified disorders of bone density and structure, unspecified site: Secondary | ICD-10-CM | POA: Diagnosis not present

## 2021-07-02 DIAGNOSIS — R5383 Other fatigue: Secondary | ICD-10-CM | POA: Diagnosis not present

## 2021-07-07 ENCOUNTER — Telehealth: Payer: Self-pay | Admitting: Orthopaedic Surgery

## 2021-07-07 NOTE — Telephone Encounter (Signed)
Pt called stating she need a call back. Pt is asking to bring her own medication to hospital after surgery 3/20. Pt states the hospital informed her she can asked to bring her own meds. Please call pt about this matter at [phone]. ?

## 2021-07-08 ENCOUNTER — Telehealth: Payer: Self-pay | Admitting: Orthopaedic Surgery

## 2021-07-08 NOTE — Telephone Encounter (Signed)
Correct

## 2021-07-08 NOTE — Telephone Encounter (Signed)
They will give her meds there correct. No need for her to bring her own? ? ?

## 2021-07-08 NOTE — Telephone Encounter (Signed)
[employer] [facility] will call patient. ? ?

## 2021-07-08 NOTE — Telephone Encounter (Signed)
duplicate

## 2021-07-08 NOTE — Telephone Encounter (Signed)
Patient is calling to ask if she can take her own medications to the hospital.  She is scheduled for right total hip on 07-14-21 with Dr. Erlinda Hong. Patient's call back  [phone] ?

## 2021-07-09 NOTE — Progress Notes (Signed)
Surgical Instructions ? ? ? Your procedure is scheduled on Monday 07/14/21. ? ? Report to [facility] Main Entrance "A" at 12:00 P.M., then check in with the Admitting office. ? Call this number if you have problems the morning of surgery: ? [phone] ? ? If you have any questions prior to your surgery date call [phone]: Open Monday-Friday 8am-4pm ? ? ? Remember: ? Do not eat after midnight the night before your surgery ? ?You may drink clear liquids until 11:00 A.M. the morning of your surgery.   ?Clear liquids allowed are: Water, Non-Citrus Juices (without pulp), Carbonated Beverages, Clear Tea, Black Coffee ONLY (NO MILK, CREAM OR POWDERED CREAMER of any kind), and Gatorade ? ? ?Enhanced Recovery after Surgery for Orthopedics ?Enhanced Recovery after Surgery is a protocol used to improve the stress on your body and your recovery after surgery. ? ?Patient Instructions ? ?The day of surgery (if you do NOT have diabetes):  ?Drink ONE (1) Pre-Surgery Clear Ensure by 11:00 am the morning of surgery   ?This drink was given to you during your hospital  ?pre-op appointment visit. ?Nothing else to drink after completing the  ?Pre-Surgery Clear Ensure. ? ?       If you have questions, please contact your surgeon?s office.  ?  ? Take these medicines the morning of surgery with A SIP OF WATER:  ? NONE ? ?As of today, STOP taking any Aspirin (unless otherwise instructed by your surgeon) meloxicam (MOBIC), Aleve, Naproxen, Ibuprofen, Motrin, Advil, Goody's, BC's, all herbal medications, fish oil, and all vitamins. ? ?         ?Do not wear jewelry or makeup ?Do not wear lotions, powders, perfumes/colognes, or deodorant. ?Do not shave 48 hours prior to surgery.  Men may shave face and neck. ?Do not bring valuables to the hospital. ?Do not wear nail polish, gel polish, artificial nails, or any other type of covering on natural nails (fingers and toes) ?If you have artificial nails or gel coating that need to be removed by a  nail salon, please have this removed prior to surgery. Artificial nails or gel coating may interfere with anesthesia's ability to adequately monitor your vital signs. ? ? is not responsible for any belongings or valuables. .  ? ?Do NOT Smoke (Tobacco/Vaping)  24 hours prior to your procedure ? ?If you use a CPAP at night, you may bring your mask for your overnight stay. ?  ?Contacts, glasses, hearing aids, dentures or partials may not be worn into surgery, please bring cases for these belongings ?  ?For patients admitted to the hospital, discharge time will be determined by your treatment team. ?  ?Patients discharged the day of surgery will not be allowed to drive home, and someone needs to stay with them for 24 hours. ? ?NO VISITORS WILL BE ALLOWED IN PRE-OP WHERE PATIENTS ARE PREPPED FOR SURGERY.  ONLY 1 SUPPORT PERSON MAY BE PRESENT IN THE WAITING ROOM WHILE YOU ARE IN SURGERY.  IF YOU ARE TO BE ADMITTED, ONCE YOU ARE IN YOUR ROOM YOU WILL BE ALLOWED TWO (2) VISITORS. 1 (ONE) VISITOR MAY STAY OVERNIGHT BUT MUST ARRIVE TO THE ROOM BY 8pm.  Minor children may have two parents present. Special consideration for safety and communication needs will be reviewed on a case by case basis. ? ?Special instructions:   ? ?Oral Hygiene is also important to reduce your risk of infection.  Remember - BRUSH YOUR TEETH THE MORNING OF SURGERY WITH YOUR  REGULAR TOOTHPASTE ? ? ?[facility]- Preparing For Surgery ? ?Before surgery, you can play an important role. Because skin is not sterile, your skin needs to be as free of germs as possible. You can reduce the number of germs on your skin by washing with CHG (chlorahexidine gluconate) Soap before surgery.  CHG is an antiseptic cleaner which kills germs and bonds with the skin to continue killing germs even after washing.   ? ? ?Please do not use if you have an allergy to CHG or antibacterial soaps. If your skin becomes reddened/irritated stop using the CHG.  ?Do not shave  (including legs and underarms) for at least 48 hours prior to first CHG shower. It is OK to shave your face. ? ?Please follow these instructions carefully. ?  ? ? Shower the NIGHT BEFORE SURGERY and the MORNING OF SURGERY with CHG Soap.  ? If you chose to wash your hair, wash your hair first as usual with your normal shampoo. After you shampoo, rinse your hair and body thoroughly to remove the shampoo.  Then [employer] and genitals (private parts) with your normal soap and rinse thoroughly to remove soap. ? ?After that Use CHG Soap as you would any other liquid soap. You can apply CHG directly to the skin and wash gently with a scrungie or a clean washcloth.  ? ?Apply the CHG Soap to your body ONLY FROM THE NECK DOWN.  Do not use on open wounds or open sores. Avoid contact with your eyes, ears, mouth and genitals (private parts). Wash Face and genitals (private parts)  with your normal soap.  ? ?Wash thoroughly, paying special attention to the area where your surgery will be performed. ? ?Thoroughly rinse your body with warm water from the neck down. ? ?DO NOT shower/wash with your normal soap after using and rinsing off the CHG Soap. ? ?Pat yourself dry with a CLEAN TOWEL. ? ?Wear CLEAN PAJAMAS to bed the night before surgery ? ?Place CLEAN SHEETS on your bed the night before your surgery ? ?DO NOT SLEEP WITH PETS. ? ? ?Day of Surgery: ? ?Take a shower with CHG soap. ?Wear Clean/Comfortable clothing the morning of surgery ?Do not apply any deodorants/lotions.   ?Remember to brush your teeth WITH YOUR REGULAR TOOTHPASTE. ? ? ? ?COVID testing ? ?If you are going to stay overnight or be admitted after your procedure/surgery and require a pre-op COVID test, please follow these instructions after your COVID test  ? ?You are not required to quarantine however you are required to wear a well-fitting mask when you are out and around people not in your household.  If your mask becomes wet or soiled, replace with a new  one. ? ?Wash your hands often with soap and water for 20 seconds or clean your hands with an alcohol-based hand sanitizer that contains at least 60% alcohol. ? ?Do not share personal items. ? ?Notify your provider: ?if you are in close contact with someone who has COVID  ?or if you develop a fever of 100.4 or greater, sneezing, cough, sore throat, shortness of breath or body aches. ? ?  ?Please read over the following fact sheets that you were given.  ? ?

## 2021-07-10 ENCOUNTER — Other Ambulatory Visit: Payer: Self-pay

## 2021-07-10 ENCOUNTER — Encounter (HOSPITAL_COMMUNITY): Payer: Self-pay

## 2021-07-10 ENCOUNTER — Telehealth: Payer: Self-pay | Admitting: *Deleted

## 2021-07-10 ENCOUNTER — Other Ambulatory Visit: Payer: Self-pay | Admitting: Physician Assistant

## 2021-07-10 ENCOUNTER — Encounter (HOSPITAL_COMMUNITY)
Admission: RE | Admit: 2021-07-10 | Discharge: 2021-07-10 | Disposition: A | Discharge status: Home / Self Care | Payer: Medicare Other | Source: Ambulatory Visit | Attending: Orthopaedic Surgery | Admitting: Orthopaedic Surgery

## 2021-07-10 VITALS — BP 134/85 | HR 89 | Temp 98.0°F | Resp 17 | Ht 64.0 in | Wt 173.2 lb

## 2021-07-10 DIAGNOSIS — M1611 Unilateral primary osteoarthritis, right hip: Secondary | ICD-10-CM | POA: Diagnosis not present

## 2021-07-10 DIAGNOSIS — I10 Essential (primary) hypertension: Secondary | ICD-10-CM | POA: Diagnosis not present

## 2021-07-10 DIAGNOSIS — E876 Hypokalemia: Secondary | ICD-10-CM | POA: Diagnosis not present

## 2021-07-10 DIAGNOSIS — Z01812 Encounter for preprocedural laboratory examination: Secondary | ICD-10-CM | POA: Insufficient documentation

## 2021-07-10 DIAGNOSIS — H401133 Primary open-angle glaucoma, bilateral, severe stage: Secondary | ICD-10-CM | POA: Diagnosis not present

## 2021-07-10 LAB — CBC WITH DIFFERENTIAL/PLATELET
Abs Immature Granulocytes: 0.02 10*3/uL (ref 0.00–0.07)
Basophils Absolute: 0.1 10*3/uL (ref 0.0–0.1)
Basophils Relative: 1 %
Eosinophils Absolute: 0.4 10*3/uL (ref 0.0–0.5)
Eosinophils Relative: 5 %
HCT: 40.8 % (ref 36.0–46.0)
Hemoglobin: 13.7 g/dL (ref 12.0–15.0)
Immature Granulocytes: 0 %
Lymphocytes Relative: 45 %
Lymphs Abs: 3.8 10*3/uL (ref 0.7–4.0)
MCH: 29.4 pg (ref 26.0–34.0)
MCHC: 33.6 g/dL (ref 30.0–36.0)
MCV: 87.6 fL (ref 80.0–100.0)
Monocytes Absolute: 0.9 10*3/uL (ref 0.1–1.0)
Monocytes Relative: 11 %
Neutro Abs: 3.1 10*3/uL (ref 1.7–7.7)
Neutrophils Relative %: 38 %
Platelets: 268 10*3/uL (ref 150–400)
RBC: 4.66 MIL/uL (ref 3.87–5.11)
RDW: 12.7 % (ref 11.5–15.5)
WBC: 8.3 10*3/uL (ref 4.0–10.5)
nRBC: 0 % (ref 0.0–0.2)

## 2021-07-10 LAB — COMPREHENSIVE METABOLIC PANEL
ALT: 11 U/L (ref 0–44)
AST: 24 U/L (ref 15–41)
Albumin: 3.9 g/dL (ref 3.5–5.0)
Alkaline Phosphatase: 57 U/L (ref 38–126)
Anion gap: 11 (ref 5–15)
BUN: 8 mg/dL (ref 8–23)
CO2: 25 mmol/L (ref 22–32)
Calcium: 9.3 mg/dL (ref 8.9–10.3)
Chloride: 99 mmol/L (ref 98–111)
Creatinine, Ser: 0.68 mg/dL (ref 0.44–1.00)
GFR, Estimated: >60 mL/min (ref >60)
Glucose, Bld: 92 mg/dL (ref 70–99)
Potassium: 3.2 mmol/L — ABNORMAL LOW (ref 3.5–5.1)
Sodium: 135 mmol/L (ref 135–145)
Total Bilirubin: 0.5 mg/dL (ref 0.3–1.2)
Total Protein: 6.6 g/dL (ref 6.5–8.1)

## 2021-07-10 LAB — SURGICAL PCR SCREEN
MRSA, PCR: NEGATIVE
Staphylococcus aureus: NEGATIVE

## 2021-07-10 LAB — TYPE AND SCREEN
ABO/RH(D): O POS
Antibody Screen: NEGATIVE

## 2021-07-10 MED ORDER — METHOCARBAMOL 500 MG PO TABS: 500.0000 mg | ORAL_TABLET | Freq: Two times a day (BID) | ORAL | 2 refills | Status: DC | PRN

## 2021-07-10 MED ORDER — ASPIRIN EC 81 MG PO TBEC: 81.0000 mg | DELAYED_RELEASE_TABLET | Freq: Two times a day (BID) | ORAL | 0 refills | Status: DC

## 2021-07-10 MED ORDER — OXYCODONE-ACETAMINOPHEN 5-325 MG PO TABS: 1.0000 | ORAL_TABLET | Freq: Four times a day (QID) | ORAL | 0 refills | Status: DC | PRN

## 2021-07-10 MED ORDER — POTASSIUM CHLORIDE CRYS ER 10 MEQ PO TBCR: EXTENDED_RELEASE_TABLET | ORAL | 0 refills | Status: DC

## 2021-07-10 MED ORDER — ONDANSETRON HCL 4 MG PO TABS: 4.0000 mg | ORAL_TABLET | Freq: Three times a day (TID) | ORAL | 0 refills | Status: DC | PRN

## 2021-07-10 MED ORDER — DOCUSATE SODIUM 100 MG PO CAPS: 100.0000 mg | ORAL_CAPSULE | Freq: Every day | ORAL | 2 refills | Status: DC | PRN

## 2021-07-10 NOTE — Care Plan (Signed)
[facility] RNCM call to patient to discuss her upcoming Right total hip arthroplasty with Dr. Erlinda Hong on Monday, 07/14/21. She is an Ortho bundle patient through [facility] and is agreeable to case management. She lives along, but has a daughter and son that will be taking turns staying with her after discharge. She will need a RW and 3in1/BSC. These will be provided at hospital through [facility]. Anticipate HHPT will be needed after a short hospital stay. Referral made to [facility] after choice provided. Reviewed all post op care instructions. Will continue to follow for needs. ?

## 2021-07-10 NOTE — Progress Notes (Signed)
Low potassium.  Sent in potassium supplements to start taking asap!

## 2021-07-10 NOTE — Telephone Encounter (Signed)
Ortho bundle Pre-op call completed. 

## 2021-07-10 NOTE — Progress Notes (Addendum)
PCP - Dr. Chapman Moss ? ?Cardiologist -  ? ?Chest x-ray - na ? ?EKG - 05/16/21 ? ?Stress test- nop ? ?ECHO - no ? ?Cardiac Cath - no ? ?Sleep Study - no ?CPAP - no ? ?LABS-T/S, CBC with Diff, CMP. PCR ?Potassium, was 3.2, I left a voice message for Veatrice Bourbon. At Dr. Phoebe Sharps office ? ?ASA- no to start post op. ? ?ERAS-yes ? ?HA1C-na ?Fasting Blood Sugar - na ?Checks Blood Sugar -na_ times a day ? ?Anesthesia- ? ?Pt denies having chest pain, sob, or fever at this time. All instructions explained to the pt, with a verbal understanding of the material. Pt agrees to go over the instructions while at home for a better understanding. Pt also instructed to self quarantine after being tested for COVID-19. The opportunity to ask questions was provided.  ?

## 2021-07-11 ENCOUNTER — Other Ambulatory Visit: Payer: Self-pay | Admitting: Physician Assistant

## 2021-07-11 MED ORDER — TRANEXAMIC ACID 1000 MG/10ML IV SOLN
2000.0000 mg | INTRAVENOUS | Status: AC
  Filled 2021-07-11: qty 20

## 2021-07-11 NOTE — Progress Notes (Signed)
Spoke to patient

## 2021-07-11 NOTE — Progress Notes (Signed)
Patient aware.

## 2021-07-13 DIAGNOSIS — M1611 Unilateral primary osteoarthritis, right hip: Secondary | ICD-10-CM

## 2021-07-13 NOTE — Anesthesia Preprocedure Evaluation (Addendum)
Anesthesia Evaluation  ?Patient identified by MRN, date of birth, ID band ?Patient awake ? ? ? ?Reviewed: ?Allergy & Precautions, NPO status , Patient's Chart, lab work & pertinent test results ? ?Airway ?Mallampati: II ? ?TM Distance: >3 FB ?Neck ROM: Full ? ? ? Dental ? ?(+) Teeth Intact, Dental Advisory Given, Caps,  ?  ?Pulmonary ?former smoker,  ?  ?Pulmonary exam normal ?breath sounds clear to auscultation ? ? ? ? ? ? Cardiovascular ?hypertension, Pt. on medications ?(-) angina(-) Past MI Normal cardiovascular exam ?Rhythm:Regular Rate:Normal ? ? ?  ?Neuro/Psych ?negative neurological ROS ? negative psych ROS  ? GI/Hepatic ?negative GI ROS, Neg liver ROS,   ?Endo/Other  ?negative endocrine ROS ? Renal/GU ?negative Renal ROS  ? ?  ?Musculoskeletal ? ?(+) Arthritis , Osteoarthritis,  right hip osteoarthritis  ? Abdominal ?  ?Peds ? Hematology ?negative hematology ROS ?(+) Plt 268k   ?Anesthesia Other Findings ? ? Reproductive/Obstetrics ? ?  ? ? ? ? ? ? ? ? ? ? ? ? ? ?  ?  ? ? ? ? ? ? ? ?Anesthesia Physical ?Anesthesia Plan ? ?ASA: 2 ? ?Anesthesia Plan: Spinal  ? ?Post-op Pain Management: Tylenol PO (pre-op)*  ? ?Induction: Intravenous ? ?PONV Risk Score and Plan: 2 and TIVA, Treatment may vary due to age or medical condition, Dexamethasone and Ondansetron ? ?Airway Management Planned: Natural Airway and Nasal Cannula ? ?Additional Equipment:  ? ?Intra-op Plan:  ? ?Post-operative Plan:  ? ?Informed Consent: I have reviewed the patients History and Physical, chart, labs and discussed the procedure including the risks, benefits and alternatives for the proposed anesthesia with the patient or authorized representative who has indicated his/her understanding and acceptance.  ? ? ? ?Dental advisory given ? ?Plan Discussed with: CRNA ? ?Anesthesia Plan Comments:   ? ? ? ? ? ?Anesthesia Quick Evaluation ? ?

## 2021-07-14 ENCOUNTER — Observation Stay (HOSPITAL_COMMUNITY)
Admission: RE | Admit: 2021-07-14 | Discharge: 2021-07-15 | Disposition: A | Discharge status: Home Health | Payer: Medicare Other | Attending: Orthopaedic Surgery | Admitting: Orthopaedic Surgery

## 2021-07-14 ENCOUNTER — Ambulatory Visit (HOSPITAL_COMMUNITY): Payer: Medicare Other

## 2021-07-14 ENCOUNTER — Ambulatory Visit (HOSPITAL_BASED_OUTPATIENT_CLINIC_OR_DEPARTMENT_OTHER): Payer: Medicare Other | Admitting: Anesthesiology

## 2021-07-14 ENCOUNTER — Ambulatory Visit (HOSPITAL_COMMUNITY): Payer: Medicare Other | Admitting: Anesthesiology

## 2021-07-14 ENCOUNTER — Encounter (HOSPITAL_COMMUNITY): Payer: Self-pay | Admitting: Orthopaedic Surgery

## 2021-07-14 ENCOUNTER — Observation Stay (HOSPITAL_COMMUNITY): Payer: Medicare Other

## 2021-07-14 ENCOUNTER — Encounter (HOSPITAL_COMMUNITY)
Admission: RE | Disposition: A | Discharge status: Home Health | Payer: Self-pay | Source: Home / Self Care | Attending: Orthopaedic Surgery

## 2021-07-14 ENCOUNTER — Other Ambulatory Visit: Payer: Self-pay

## 2021-07-14 DIAGNOSIS — M1611 Unilateral primary osteoarthritis, right hip: Principal | ICD-10-CM

## 2021-07-14 DIAGNOSIS — Z471 Aftercare following joint replacement surgery: Secondary | ICD-10-CM | POA: Diagnosis not present

## 2021-07-14 DIAGNOSIS — Z7982 Long term (current) use of aspirin: Secondary | ICD-10-CM | POA: Diagnosis not present

## 2021-07-14 DIAGNOSIS — Z87891 Personal history of nicotine dependence: Secondary | ICD-10-CM | POA: Diagnosis not present

## 2021-07-14 DIAGNOSIS — Z96641 Presence of right artificial hip joint: Secondary | ICD-10-CM

## 2021-07-14 DIAGNOSIS — Z79899 Other long term (current) drug therapy: Secondary | ICD-10-CM | POA: Diagnosis not present

## 2021-07-14 DIAGNOSIS — I10 Essential (primary) hypertension: Secondary | ICD-10-CM | POA: Insufficient documentation

## 2021-07-14 DIAGNOSIS — M1612 Unilateral primary osteoarthritis, left hip: Secondary | ICD-10-CM | POA: Diagnosis not present

## 2021-07-14 HISTORY — PX: TOTAL HIP ARTHROPLASTY: SHX124

## 2021-07-14 LAB — ABO/RH: ABO/RH(D): O POS

## 2021-07-14 SURGERY — ARTHROPLASTY, HIP, TOTAL, ANTERIOR APPROACH
Anesthesia: Spinal | Site: Hip | Laterality: Right

## 2021-07-14 MED ORDER — POLYETHYLENE GLYCOL 3350 17 G PO PACK
17.0000 g | PACK | Freq: Every day | ORAL | Status: DC
  Administered 2021-07-15: 17 g via ORAL
  Filled 2021-07-14: qty 1

## 2021-07-14 MED ORDER — MIDAZOLAM HCL 2 MG/2ML IJ SOLN
INTRAMUSCULAR | Status: DC | PRN
  Administered 2021-07-14: 2 mg via INTRAVENOUS

## 2021-07-14 MED ORDER — ONDANSETRON HCL 4 MG/2ML IJ SOLN: 4.0000 mg | Freq: Once | INTRAMUSCULAR | Status: DC | PRN

## 2021-07-14 MED ORDER — LACTATED RINGERS IV SOLN: INTRAVENOUS | Status: DC

## 2021-07-14 MED ORDER — ASPIRIN 81 MG PO CHEW
81.0000 mg | CHEWABLE_TABLET | Freq: Two times a day (BID) | ORAL | Status: DC
  Administered 2021-07-14 – 2021-07-15 (×2): 81 mg via ORAL
  Filled 2021-07-14 (×2): qty 1

## 2021-07-14 MED ORDER — CEFAZOLIN SODIUM-DEXTROSE 2-4 GM/100ML-% IV SOLN
2.0000 g | Freq: Four times a day (QID) | INTRAVENOUS | Status: AC
  Administered 2021-07-14 (×2): 2 g via INTRAVENOUS
  Filled 2021-07-14 (×2): qty 100

## 2021-07-14 MED ORDER — HYDROMORPHONE HCL 1 MG/ML IJ SOLN: 0.5000 mg | INTRAMUSCULAR | Status: DC | PRN

## 2021-07-14 MED ORDER — BUPIVACAINE IN DEXTROSE 0.75-8.25 % IT SOLN
INTRATHECAL | Status: DC | PRN
  Administered 2021-07-14: 1.6 mL via INTRATHECAL

## 2021-07-14 MED ORDER — PROPOFOL 1000 MG/100ML IV EMUL
INTRAVENOUS | Status: AC
  Filled 2021-07-14: qty 100

## 2021-07-14 MED ORDER — CEFAZOLIN SODIUM-DEXTROSE 2-4 GM/100ML-% IV SOLN
2.0000 g | Freq: Once | INTRAVENOUS | Status: AC
  Administered 2021-07-14: 2 g via INTRAVENOUS
  Filled 2021-07-14: qty 100

## 2021-07-14 MED ORDER — ALUM & MAG HYDROXIDE-SIMETH 200-200-20 MG/5ML PO SUSP: 30.0000 mL | ORAL | Status: DC | PRN

## 2021-07-14 MED ORDER — METHOCARBAMOL 500 MG PO TABS
500.0000 mg | ORAL_TABLET | Freq: Four times a day (QID) | ORAL | Status: DC | PRN
  Administered 2021-07-14 – 2021-07-15 (×2): 500 mg via ORAL
  Filled 2021-07-14 (×2): qty 1

## 2021-07-14 MED ORDER — ONDANSETRON HCL 4 MG/2ML IJ SOLN
INTRAMUSCULAR | Status: AC
  Filled 2021-07-14: qty 2

## 2021-07-14 MED ORDER — LACTATED RINGERS IV SOLN
INTRAVENOUS | Status: DC
Start: 2021-07-14 — End: 2021-07-14

## 2021-07-14 MED ORDER — ONDANSETRON HCL 4 MG/2ML IJ SOLN
INTRAMUSCULAR | Status: DC | PRN
Start: 2021-07-14 — End: 2021-07-14
  Administered 2021-07-14: 4 mg via INTRAVENOUS

## 2021-07-14 MED ORDER — VANCOMYCIN HCL 1000 MG IV SOLR
INTRAVENOUS | Status: AC
  Filled 2021-07-14: qty 20

## 2021-07-14 MED ORDER — METOCLOPRAMIDE HCL 5 MG/ML IJ SOLN
5.0000 mg | Freq: Three times a day (TID) | INTRAMUSCULAR | Status: DC | PRN
  Administered 2021-07-15: 10 mg via INTRAVENOUS
  Filled 2021-07-14: qty 2

## 2021-07-14 MED ORDER — PRONTOSAN WOUND IRRIGATION OPTIME
TOPICAL | Status: DC | PRN
  Administered 2021-07-14: 1 via TOPICAL

## 2021-07-14 MED ORDER — VANCOMYCIN HCL 1 G IV SOLR
INTRAVENOUS | Status: DC | PRN
  Administered 2021-07-14: 1000 mg

## 2021-07-14 MED ORDER — PHENOL 1.4 % MT LIQD: 1.0000 | OROMUCOSAL | Status: DC | PRN

## 2021-07-14 MED ORDER — DEXAMETHASONE SODIUM PHOSPHATE 10 MG/ML IJ SOLN
10.0000 mg | Freq: Once | INTRAMUSCULAR | Status: AC
  Administered 2021-07-15: 10 mg via INTRAVENOUS
  Filled 2021-07-14: qty 1

## 2021-07-14 MED ORDER — SORBITOL 70 % SOLN: 30.0000 mL | Freq: Every day | Status: DC | PRN

## 2021-07-14 MED ORDER — FENTANYL CITRATE (PF) 250 MCG/5ML IJ SOLN
INTRAMUSCULAR | Status: AC
  Filled 2021-07-14: qty 5

## 2021-07-14 MED ORDER — BUPIVACAINE-MELOXICAM ER 400-12 MG/14ML IJ SOLN
INTRAMUSCULAR | Status: AC
  Filled 2021-07-14: qty 1

## 2021-07-14 MED ORDER — POVIDONE-IODINE 10 % EX SWAB
2.0000 | Freq: Once | CUTANEOUS | Status: DC
Start: 2021-07-14 — End: 2021-07-14

## 2021-07-14 MED ORDER — DIPHENHYDRAMINE HCL 12.5 MG/5ML PO ELIX
25.0000 mg | ORAL_SOLUTION | ORAL | Status: DC | PRN
  Filled 2021-07-14: qty 10

## 2021-07-14 MED ORDER — PROPOFOL 500 MG/50ML IV EMUL
INTRAVENOUS | Status: DC | PRN
  Administered 2021-07-14: 75 ug/kg/min via INTRAVENOUS

## 2021-07-14 MED ORDER — PHENYLEPHRINE HCL-NACL 20-0.9 MG/250ML-% IV SOLN
INTRAVENOUS | Status: DC | PRN
  Administered 2021-07-14: 25 ug/min via INTRAVENOUS

## 2021-07-14 MED ORDER — OXYCODONE HCL ER 10 MG PO T12A
10.0000 mg | EXTENDED_RELEASE_TABLET | Freq: Two times a day (BID) | ORAL | Status: DC
  Administered 2021-07-14 (×2): 10 mg via ORAL
  Filled 2021-07-14 (×2): qty 1

## 2021-07-14 MED ORDER — PROPOFOL 10 MG/ML IV BOLUS
INTRAVENOUS | Status: AC
  Filled 2021-07-14: qty 20

## 2021-07-14 MED ORDER — ORAL CARE MOUTH RINSE: 15.0000 mL | Freq: Once | OROMUCOSAL | Status: AC

## 2021-07-14 MED ORDER — FENTANYL CITRATE (PF) 100 MCG/2ML IJ SOLN: 25.0000 ug | INTRAMUSCULAR | Status: DC | PRN

## 2021-07-14 MED ORDER — OXYCODONE HCL 5 MG PO TABS: 10.0000 mg | ORAL_TABLET | ORAL | Status: DC | PRN

## 2021-07-14 MED ORDER — MENTHOL 3 MG MT LOZG: 1.0000 | LOZENGE | OROMUCOSAL | Status: DC | PRN

## 2021-07-14 MED ORDER — METHOCARBAMOL 1000 MG/10ML IJ SOLN
500.0000 mg | Freq: Four times a day (QID) | INTRAVENOUS | Status: DC | PRN
  Filled 2021-07-14: qty 5

## 2021-07-14 MED ORDER — OXYCODONE HCL 5 MG PO TABS: 5.0000 mg | ORAL_TABLET | ORAL | Status: DC | PRN

## 2021-07-14 MED ORDER — ONDANSETRON HCL 4 MG PO TABS
4.0000 mg | ORAL_TABLET | Freq: Four times a day (QID) | ORAL | Status: DC | PRN
  Administered 2021-07-15: 4 mg via ORAL
  Filled 2021-07-14: qty 1

## 2021-07-14 MED ORDER — LACTATED RINGERS IV SOLN: INTRAVENOUS | Status: DC | PRN

## 2021-07-14 MED ORDER — DOCUSATE SODIUM 100 MG PO CAPS
100.0000 mg | ORAL_CAPSULE | Freq: Two times a day (BID) | ORAL | Status: DC
  Administered 2021-07-14 – 2021-07-15 (×2): 100 mg via ORAL
  Filled 2021-07-14 (×2): qty 1

## 2021-07-14 MED ORDER — METOCLOPRAMIDE HCL 5 MG PO TABS: 5.0000 mg | ORAL_TABLET | Freq: Three times a day (TID) | ORAL | Status: DC | PRN

## 2021-07-14 MED ORDER — PANTOPRAZOLE SODIUM 40 MG PO TBEC
40.0000 mg | DELAYED_RELEASE_TABLET | Freq: Every day | ORAL | Status: DC
  Administered 2021-07-14 – 2021-07-15 (×2): 40 mg via ORAL
  Filled 2021-07-14 (×2): qty 1

## 2021-07-14 MED ORDER — 0.9 % SODIUM CHLORIDE (POUR BTL) OPTIME
TOPICAL | Status: DC | PRN
  Administered 2021-07-14: 1000 mL

## 2021-07-14 MED ORDER — ONDANSETRON HCL 4 MG/2ML IJ SOLN
4.0000 mg | Freq: Four times a day (QID) | INTRAMUSCULAR | Status: DC | PRN
  Administered 2021-07-14: 4 mg via INTRAVENOUS
  Filled 2021-07-14: qty 2

## 2021-07-14 MED ORDER — FENTANYL CITRATE (PF) 250 MCG/5ML IJ SOLN
INTRAMUSCULAR | Status: DC | PRN
  Administered 2021-07-14: 50 ug via INTRAVENOUS

## 2021-07-14 MED ORDER — MIDAZOLAM HCL 2 MG/2ML IJ SOLN
INTRAMUSCULAR | Status: AC
  Filled 2021-07-14: qty 2

## 2021-07-14 MED ORDER — CHLORHEXIDINE GLUCONATE 0.12 % MT SOLN
15.0000 mL | Freq: Once | OROMUCOSAL | Status: AC
  Administered 2021-07-14: 15 mL via OROMUCOSAL
  Filled 2021-07-14: qty 15

## 2021-07-14 MED ORDER — SODIUM CHLORIDE 0.9 % IV SOLN: INTRAVENOUS | Status: DC

## 2021-07-14 MED ORDER — TRANEXAMIC ACID 1000 MG/10ML IV SOLN
INTRAVENOUS | Status: DC | PRN
  Administered 2021-07-14: 2000 mg via TOPICAL

## 2021-07-14 MED ORDER — ACETAMINOPHEN 325 MG PO TABS: 325.0000 mg | ORAL_TABLET | Freq: Four times a day (QID) | ORAL | Status: DC | PRN

## 2021-07-14 MED ORDER — CLINDAMYCIN PHOSPHATE 900 MG/50ML IV SOLN: 900.0000 mg | INTRAVENOUS | Status: DC

## 2021-07-14 MED ORDER — ACETAMINOPHEN 500 MG PO TABS
1000.0000 mg | ORAL_TABLET | Freq: Once | ORAL | Status: AC
  Administered 2021-07-14: 1000 mg via ORAL
  Filled 2021-07-14: qty 2

## 2021-07-14 MED ORDER — BUPIVACAINE-MELOXICAM ER 400-12 MG/14ML IJ SOLN
INTRAMUSCULAR | Status: DC | PRN
  Administered 2021-07-14: 400 mg

## 2021-07-14 MED ORDER — TRANEXAMIC ACID-NACL 1000-0.7 MG/100ML-% IV SOLN
1000.0000 mg | INTRAVENOUS | Status: AC
  Administered 2021-07-14: 1000 mg via INTRAVENOUS
  Filled 2021-07-14: qty 100

## 2021-07-14 MED ORDER — ACETAMINOPHEN 500 MG PO TABS
1000.0000 mg | ORAL_TABLET | Freq: Four times a day (QID) | ORAL | Status: AC
  Administered 2021-07-14 – 2021-07-15 (×4): 1000 mg via ORAL
  Filled 2021-07-14 (×4): qty 2

## 2021-07-14 MED ORDER — SODIUM CHLORIDE 0.9 % IR SOLN
Status: DC | PRN
  Administered 2021-07-14: 1000 mL

## 2021-07-14 MED ORDER — LIDOCAINE 2% (20 MG/ML) 5 ML SYRINGE
INTRAMUSCULAR | Status: AC
  Filled 2021-07-14: qty 5

## 2021-07-14 MED ORDER — TRANEXAMIC ACID-NACL 1000-0.7 MG/100ML-% IV SOLN
1000.0000 mg | Freq: Once | INTRAVENOUS | Status: AC
  Administered 2021-07-14: 1000 mg via INTRAVENOUS
  Filled 2021-07-14: qty 100

## 2021-07-14 SURGICAL SUPPLY — 64 items
ADH SKN CLS APL DERMABOND .7 (GAUZE/BANDAGES/DRESSINGS) ×1
BAG COUNTER SPONGE SURGICOUNT (BAG) ×3 IMPLANT
BAG DECANTER FOR FLEXI CONT (MISCELLANEOUS) ×3 IMPLANT
BAG SPNG CNTER NS LX DISP (BAG) ×1
CELLS DAT CNTRL 66122 CELL SVR (MISCELLANEOUS) IMPLANT
COVER PERINEAL POST (MISCELLANEOUS) ×3 IMPLANT
COVER SURGICAL LIGHT HANDLE (MISCELLANEOUS) ×3 IMPLANT
DERMABOND ADVANCED (GAUZE/BANDAGES/DRESSINGS) ×1
DERMABOND ADVANCED .7 DNX12 (GAUZE/BANDAGES/DRESSINGS) IMPLANT
DRAPE C-ARM 42X72 X-RAY (DRAPES) ×3 IMPLANT
DRAPE POUCH INSTRU U-SHP 10X18 (DRAPES) ×3 IMPLANT
DRAPE STERI IOBAN 125X83 (DRAPES) ×3 IMPLANT
DRAPE U-SHAPE 47X51 STRL (DRAPES) ×6 IMPLANT
DRSG AQUACEL AG ADV 3.5X10 (GAUZE/BANDAGES/DRESSINGS) ×3 IMPLANT
DURAPREP 26ML APPLICATOR (WOUND CARE) ×6 IMPLANT
ELECT BLADE 4.0 EZ CLEAN MEGAD (MISCELLANEOUS) ×2
ELECT REM PT RETURN 9FT ADLT (ELECTROSURGICAL) ×2
ELECTRODE BLDE 4.0 EZ CLN MEGD (MISCELLANEOUS) ×2 IMPLANT
ELECTRODE REM PT RTRN 9FT ADLT (ELECTROSURGICAL) ×2 IMPLANT
GLOVE BIOGEL PI IND STRL 7.5 (GLOVE) ×8 IMPLANT
GLOVE BIOGEL PI INDICATOR 7.5 (GLOVE) ×4
GLOVE SURG LTX SZ7 (GLOVE) ×6 IMPLANT
GLOVE SURG UNDER POLY LF SZ7 (GLOVE) ×6 IMPLANT
GLOVE SURG UNDER POLY LF SZ7.5 (GLOVE) ×6 IMPLANT
GOWN STRL REIN XL XLG (GOWN DISPOSABLE) ×3 IMPLANT
GOWN STRL REUS W/ TWL LRG LVL3 (GOWN DISPOSABLE) IMPLANT
GOWN STRL REUS W/ TWL XL LVL3 (GOWN DISPOSABLE) ×2 IMPLANT
GOWN STRL REUS W/TWL LRG LVL3 (GOWN DISPOSABLE)
GOWN STRL REUS W/TWL XL LVL3 (GOWN DISPOSABLE) ×2
HANDPIECE INTERPULSE COAX TIP (DISPOSABLE) ×2
HEAD CERAMIC DELTA 36 PLUS 1.5 (Hips) ×1 IMPLANT
HOOD PEEL AWAY FLYTE STAYCOOL (MISCELLANEOUS) ×6 IMPLANT
IV NS IRRIG 3000ML ARTHROMATIC (IV SOLUTION) ×3 IMPLANT
JET LAVAGE IRRISEPT WOUND (IRRIGATION / IRRIGATOR) ×2
KIT BASIN OR (CUSTOM PROCEDURE TRAY) ×3 IMPLANT
LAVAGE JET IRRISEPT WOUND (IRRIGATION / IRRIGATOR) ×2 IMPLANT
LINER NEUTRAL 52X36MM PLUS 4 (Liner) ×1 IMPLANT
MARKER SKIN DUAL TIP RULER LAB (MISCELLANEOUS) ×3 IMPLANT
NDL SPNL 18GX3.5 QUINCKE PK (NEEDLE) ×2 IMPLANT
NEEDLE SPNL 18GX3.5 QUINCKE PK (NEEDLE) ×2 IMPLANT
PACK TOTAL JOINT (CUSTOM PROCEDURE TRAY) ×3 IMPLANT
PACK UNIVERSAL I (CUSTOM PROCEDURE TRAY) ×3 IMPLANT
PIN SECTOR W/GRIP ACE CUP 52MM (Hips) ×1 IMPLANT
RETRACTOR WND ALEXIS 18 MED (MISCELLANEOUS) IMPLANT
RTRCTR WOUND ALEXIS 18CM MED (MISCELLANEOUS)
SAW OSC TIP CART 19.5X105X1.3 (SAW) ×3 IMPLANT
SCREW 6.5MMX25MM (Screw) ×1 IMPLANT
SET HNDPC FAN SPRY TIP SCT (DISPOSABLE) ×2 IMPLANT
STAPLER VISISTAT 35W (STAPLE) IMPLANT
STEM FEMORAL SZ5 HIGH ACTIS (Stem) ×1 IMPLANT
SUT ETHIBOND 2 V 37 (SUTURE) ×3 IMPLANT
SUT ETHILON 2 0 FS 18 (SUTURE) ×2 IMPLANT
SUT VIC AB 0 CT1 27 (SUTURE) ×2
SUT VIC AB 0 CT1 27XBRD ANBCTR (SUTURE) ×2 IMPLANT
SUT VIC AB 1 CTX 36 (SUTURE) ×2
SUT VIC AB 1 CTX36XBRD ANBCTR (SUTURE) ×2 IMPLANT
SUT VIC AB 2-0 CT1 27 (SUTURE) ×4
SUT VIC AB 2-0 CT1 TAPERPNT 27 (SUTURE) ×4 IMPLANT
SYR 50ML LL SCALE MARK (SYRINGE) ×3 IMPLANT
TOWEL GREEN STERILE (TOWEL DISPOSABLE) ×3 IMPLANT
TRAY CATH 16FR W/PLASTIC CATH (SET/KITS/TRAYS/PACK) IMPLANT
TRAY FOLEY W/BAG SLVR 16FR (SET/KITS/TRAYS/PACK) ×2
TRAY FOLEY W/BAG SLVR 16FR ST (SET/KITS/TRAYS/PACK) ×2 IMPLANT
YANKAUER SUCT BULB TIP NO VENT (SUCTIONS) ×3 IMPLANT

## 2021-07-14 NOTE — Op Note (Signed)
? ?RIGHT TOTAL HIP ARTHROPLASTY ANTERIOR APPROACH  Procedure Note ?KESHARA KIGER   [id number] ? ?Pre-op Diagnosis: right hip osteoarthritis ?    ?Post-op Diagnosis: same ?  ?Operative Procedures  ?1. Total hip replacement; Right hip; uncemented [id number]  ? ?Surgeon: Frankey Shown, M.D. ? ?Assist: Madalyn Rob, PA-C ?  ?Anesthesia: spinal ? ?Prosthesis: Depuy ?Acetabulum: Pinnacle 52 mm ?Femur: Actis 5 HO ?Head: 36 mm size: +1.5 ?Liner: +4 ?Bearing Type: ceramic/poly ? ?Total Hip Arthroplasty (Anterior Approach) Op Note:  ?After informed consent was obtained and the operative extremity marked in the holding area, the patient was brought back to the operating room and placed supine on the HANA table. Next, the operative extremity was prepped and draped in normal sterile fashion. Surgical timeout occurred verifying patient identification, surgical site, surgical procedure and administration of antibiotics.  ?A modified anterior Smith-Peterson approach to the hip was performed, using the interval between tensor fascia lata and sartorius.  Dissection was carried bluntly down onto the anterior hip capsule. The lateral femoral circumflex vessels were identified and coagulated. A capsulotomy was performed and the capsular flaps tagged for later repair.  The neck osteotomy was performed. The femoral head was removed which showed severe wear, the acetabular rim was cleared of soft tissue and attention was turned to reaming the acetabulum.  ?Sequential reaming was performed under fluoroscopic guidance. We reamed to a size 51 mm, and then impacted the acetabular shell. A 25 mm cancellous screw was placed through the shell for added fixation.  The liner was then placed after irrigation and attention turned to the femur.  ?After placing the femoral hook, the leg was taken to externally rotated, extended and adducted position taking care to perform soft tissue releases to allow for adequate mobilization of the femur.  Soft tissue was cleared from the shoulder of the greater trochanter and the hook elevator used to improve exposure of the proximal femur. Sequential broaching performed up to a size 5. Trial neck and head were placed. The leg was brought back up to neutral and the construct reduced.  Antibiotic irrigation was placed in the surgical wound and kept for at least 1 minute.  The position and sizing of components, offset and leg lengths were checked using fluoroscopy. Stability of the construct was checked in extension and external rotation without any subluxation or impingement of prosthesis. We dislocated the prosthesis, dropped the leg back into position, removed trial components, and irrigated copiously. The final stem and head was then placed, the leg brought back up, the system reduced and fluoroscopy used to verify positioning.  ?We irrigated, obtained hemostasis and closed the capsule using #2 ethibond suture.  One gram of vancomycin powder was placed in the surgical bed.   One gram of topical tranexamic acid was injected into the joint.  The fascia was closed with #1 vicryl plus, the deep fat layer was closed with 0 vicryl, the subcutaneous layers closed with 2.0 Vicryl Plus and the skin closed with 2.0 nylon and dermabond. A sterile dressing was applied. The patient was awakened in the operating room and taken to recovery in stable condition.  ?All sponge, needle, and instrument counts were correct at the end of the case.  ? ?Tawanna Cooler, my PA, was a medical necessity for opening, closing, limb positioning, retracting, exposing, and overall facilitation and timely completion of the surgery. ? ?Position: supine  ?Complications: see description of procedure.  ?Time Out: performed  ? ?Drains/Packing: none ? ?Estimated blood loss: see  anesthesia record ? ?Returned to Recovery Room: in good condition.  ? ?Antibiotics: yes  ? ?Mechanical VTE (DVT) Prophylaxis: sequential compression devices, TED thigh-high   ?Chemical VTE (DVT) Prophylaxis: aspirin  ? ?Fluid Replacement: see anesthesia record ? ?Specimens Removed: 1 to pathology  ? ?Sponge and Instrument Count Correct? yes  ? ?PACU: portable radiograph - low AP  ? ?Plan/RTC: Return in 2 weeks for staple removal. ?Weight Bearing/Load Lower Extremity: full  ?Hip precautions: none ?Suture Removal: 2 weeks  ? ?N. Eduard Roux, MD ?Marga Hoots ?8:32 AM ? ? ?Implant Name Type Inv. Item Serial No. Manufacturer Lot No. LRB No. Used Action  ?PIN SECTOR W/GRIP ACE CUP 52MM - [id number] Hips PIN SECTOR W/GRIP ACE CUP 52MM  [employer] [id number] Right 1 Implanted  ?SCREW 6.5MMX25MM - [id number] Screw SCREW 6.5MMX25MM  [employer] [id number] Right 1 Implanted  ?LINER NEUTRAL 52X36MM PLUS 4 - [id number] Liner LINER NEUTRAL 52X36MM PLUS 4  [employer] [id number] Right 1 Implanted  ?STEM FEMORAL SZ5 HIGH ACTIS - [id number] Stem STEM FEMORAL SZ5 HIGH ACTIS  [employer] [id number] Right 1 Implanted  ?HEAD CERAMIC DELTA 36 PLUS 1.5 - [id number] Hips HEAD CERAMIC DELTA 36 PLUS 1.5  [employer] [id number] Right 1 Implanted  ? ? ?

## 2021-07-14 NOTE — Evaluation (Signed)
Physical Therapy Evaluation ?Patient Details ?Name: Lisa Hutchinson ?MRN: [id number] ?DOB: 08-22-1950 ?Today's Date: 07/14/2021 ? ?History of Present Illness ? Pt is a 71 yo female presenting s/p R THA direct anterior approach. PMH includes Bilateral cataracts, HTN, HLD, breast lumpectomy, osteopenia.  ?Clinical Impression ? Pt presents with s/p R THA direct anterior approach. Pt impairments include decreased strength, power, balance, activity tolerance, and increased pain. These impairments are limiting her ability to transfer, ambulate, and navigate stairs. Pt required min guard to min A with all functional mobility. Pt educated on ankle pumps and quad sets (20/hour) to perform this afternoon/evening to increase circulation and modulate pain to pt's tolerance. PT will continue to follow acutely to maximize pt's safety and independence with functional mobility. ?  ?   ?   ? ?Recommendations for follow up therapy are one component of a multi-disciplinary discharge planning process, led by the attending physician.  Recommendations may be updated based on patient status, additional functional criteria and insurance authorization. ? ?Follow Up Recommendations Follow physician's recommendations for discharge plan and follow up therapies ? ?  ?Assistance Recommended at Discharge Frequent or constant Supervision/Assistance  ?Patient can return home with the following ? A little help with bathing/dressing/bathroom;Assistance with cooking/housework;Assist for transportation;Help with stairs or ramp for entrance ? ?  ?Equipment Recommendations Rolling walker (2 wheels)  ?Recommendations for Other Services ?    ?  ?Functional Status Assessment Patient has had a recent decline in their functional status and demonstrates the ability to make significant improvements in function in a reasonable and predictable amount of time.  ? ?  ?Precautions / Restrictions Precautions ?Precautions: Fall ?Restrictions ?Weight Bearing  Restrictions: Yes ?RLE Weight Bearing: Weight bearing as tolerated  ? ?  ? ?Mobility ? Bed Mobility ?Overal bed mobility: Needs Assistance ?Bed Mobility: Supine to Sit ?  ?  ?Supine to sit: Min assist ?  ?  ?General bed mobility comments: min A for LE management to EOB with VC's to scoot hips to EOB. ?  ? ?Transfers ?Overall transfer level: Needs assistance ?Equipment used: Rolling walker (2 wheels) ?Transfers: Sit to/from Stand, Bed to chair/wheelchair/BSC ?Sit to Stand: Min assist ?Stand pivot transfers: Min guard ?  ?  ?  ?  ?General transfer comment: pt required min A for steadying and lifting to a stand with cues for hand placement. Pt felt nauseated once standing and transferred to the recliner via stand pivot. Pt was min guard with stand pivot with verbal cues to push through arms to offweigh sx leg to modulate pain. Further mobility deferred due to pt's nausea ?  ? ?Ambulation/Gait ?  ?  ?  ?  ?  ?  ?  ?  ? ?Stairs ?  ?  ?  ?  ?  ? ?Wheelchair Mobility ?  ? ?Modified Rankin (Stroke Patients Only) ?  ? ?  ? ?Balance Overall balance assessment: Needs assistance ?Sitting-balance support: Single extremity supported, Bilateral upper extremity supported, Feet supported ?Sitting balance-Leahy Scale: Fair ?Sitting balance - Comments: pt used SUE or BUE support in sitting, however was able to weightshift side to side while donning gown without becoming unsteady. Pt required supervision with sitting for safety ?  ?Standing balance support: Bilateral upper extremity supported, During functional activity, Reliant on assistive device for balance ?Standing balance-Leahy Scale: Poor ?Standing balance comment: pt required BUE support of RW in standing and during transfers. Pt required min A with standing and min guard A for static standing balance ?  ?  ?  ?  ?  ?  ?  ?  ?  ?  ?  ?   ? ? ? ?  Pertinent Vitals/Pain Pain Assessment ?Pain Assessment: 0-10 ?Pain Score: 4  ?Pain Location: R hip operative site ?Pain Descriptors /  Indicators: Aching, Discomfort ?Pain Intervention(s): Monitored during session  ? ? ?Home Living Family/patient expects to be discharged to:: Private residence ?Living Arrangements: Alone ?Available Help at Discharge: Family;Available 24 hours/day (daughter staying during the day, son staying at night for the first week) ?Type of Home: House ?Home Access: Stairs to enter ?Entrance Stairs-Rails: None ?Entrance Stairs-Number of Steps: 2 ?Alternate Level Stairs-Number of Steps: 2 ?Home Layout: Other (Comment) (sunken den) ?Home Equipment: BSC/3in1;Cane - single point;Shower seat ?   ?  ?Prior Function Prior Level of Function : Independent/Modified Independent ?  ?  ?  ?  ?  ?  ?Mobility Comments: did not need assist navigating stairs. ?ADLs Comments: able to bathe, dress, and feed herself prior ?  ? ? ?Hand Dominance  ?   ? ?  ?Extremity/Trunk Assessment  ? Upper Extremity Assessment ?Upper Extremity Assessment: Defer to OT evaluation ?  ? ?Lower Extremity Assessment ?Lower Extremity Assessment: RLE deficits/detail ?RLE Deficits / Details: consistent with R THA direct anterior approach ?  ? ?Cervical / Trunk Assessment ?Cervical / Trunk Assessment: Normal  ?Communication  ? Communication: No difficulties  ?Cognition Arousal/Alertness: Awake/alert ?Behavior During Therapy: [facility] for tasks assessed/performed ?Overall Cognitive Status: Within Functional Limits for tasks assessed ?  ?  ?  ?  ?  ?  ?  ?  ?  ?  ?  ?  ?  ?  ?  ?  ?  ?  ?  ? ?  ?General Comments   ? ?  ?Exercises Total Joint Exercises ?Ankle Circles/Pumps: Both, 5 reps, Seated ?Quad Sets: Right, 5 reps, Seated  ? ?Assessment/Plan  ?  ?PT Assessment Patient needs continued PT services  ?PT Problem List Decreased strength;Decreased range of motion;Decreased activity tolerance;Decreased balance;Decreased mobility;Decreased knowledge of use of DME;Pain ? ?   ?  ?PT Treatment Interventions DME instruction;Gait training;Stair training;Functional mobility  training;Therapeutic activities;Therapeutic exercise;Balance training;Neuromuscular re-education;Patient/family education   ? ?PT Goals (Current goals can be found in the Care Plan section)  ?Acute Rehab PT Goals ?Patient Stated Goal: to go home ?PT Goal Formulation: With patient ?Time For Goal Achievement: 07/28/21 ?Potential to Achieve Goals: Good ? ?  ?Frequency 7X/week ?  ? ? ?Co-evaluation   ?  ?  ?  ?  ? ? ?  ?AM-PAC PT "6 Clicks" Mobility  ?Outcome Measure Help needed turning from your back to your side while in a flat bed without using bedrails?: None ?Help needed moving from lying on your back to sitting on the side of a flat bed without using bedrails?: A Little ?Help needed moving to and from a bed to a chair (including a wheelchair)?: A Little ?Help needed standing up from a chair using your arms (e.g., wheelchair or bedside chair)?: A Little ?Help needed to walk in hospital room?: A Lot ?Help needed climbing 3-5 steps with a railing? : Total ?6 Click Score: 16 ? ?  ?End of Session Equipment Utilized During Treatment: Gait belt ?Activity Tolerance: Treatment limited secondary to medical complications (Comment) (pt nauseated) ?Patient left: in chair;with call bell/phone within reach ?Nurse Communication: Mobility status;Other (comment) (nausea meds) ?PT Visit Diagnosis: Other abnormalities of gait and mobility (R26.89);Unsteadiness on feet (R26.81);Muscle weakness (generalized) (M62.81);Pain;Difficulty in walking, not elsewhere classified (R26.2) ?Pain - Right/Left: Right ?Pain - part of body: Hip ?  ? ?Time: 7989-2119 ?PT Time Calculation (min) (ACUTE ONLY): 23  min ? ? ?Charges:   PT Evaluation ?$PT Eval Low Complexity: 1 Low ?PT Treatments ?$Therapeutic Activity: 8-22 mins ?  ?   ? ? ?Jonne Ply, SPT  ? ?Jonne Ply ?07/14/2021, 4:47 PM ? ?

## 2021-07-14 NOTE — TOC Progression Note (Signed)
Transition of Care (TOC) - Progression Note  ? ? ?Patient Details  ?Name: Lisa Hutchinson ?MRN: [id number] ?Date of Birth: 09/10/1950 ? ?Transition of Care (TOC) CM/SW Contact  ?Marilu Favre, RN ?Phone Number: ?07/14/2021, 1:23 PM ? ?Clinical Narrative:    ? ?Dr Erlinda Hong office has arranged home health services through [facility]. Confirmed with Aaorn with [place].  ? ?3C staff will provide any needed DME  ? ? ?Transition of Care (TOC) Screening Note ? ? ?Patient Details  ?Name: Lisa Hutchinson ?Date of Birth: Apr 22, 1951 ? ? ?Transition of Care Department [facility]) has reviewed patient and no TOC needs have been identified at this time. We will continue to monitor patient advancement through interdisciplinary progression rounds. If new patient transition needs arise, please place a TOC consult. ?  ? ?  ?  ? ?Expected Discharge Plan and Services ?  ?  ?  ?  ?  ?                ?  ?  ?  ?  ?  ?  ?  ?  ?  ?  ? ? ?Social Determinants of Health (SDOH) Interventions ?  ? ?Readmission Risk Interventions ?No flowsheet data found. ? ?

## 2021-07-14 NOTE — Anesthesia Procedure Notes (Signed)
Procedure Name: [place] ?Date/Time: 07/14/2021 7:30 AM ?Performed by: Harden Mo, CRNA ?Pre-anesthesia Checklist: Patient identified, Emergency Drugs available, Suction available and Patient being monitored ?Patient Re-evaluated:Patient Re-evaluated prior to induction ?Oxygen Delivery Method: Simple face mask ?Preoxygenation: Pre-oxygenation with 100% oxygen ?Induction Type: IV induction ?Placement Confirmation: positive ETCO2 and breath sounds checked- equal and bilateral ?Dental Injury: Teeth and Oropharynx as per pre-operative assessment  ? ? ? ? ?

## 2021-07-14 NOTE — H&P (Signed)
? ? ?PREOPERATIVE H&P ? ?Chief Complaint: right hip osteoarthritis ? ?HPI: ?Lisa Hutchinson is a 71 y.o. female who presents for surgical treatment of right hip osteoarthritis.  She denies any changes in medical history. ? ?Past Medical History:  ?Diagnosis Date  ? Anemia   ? with pregnancy   ? Arthritis   ? knees  ? Atrophic vaginitis   ? Cataract   ? bilateral  ? Edema   ? H/O mammogram 05/03/12  ? Hyperlipidemia   ? no medicines   ? Hypertension   ? Hypokalemia   ? Sleep difficulties   ? sleep study normal, fall 2013  ? ?Past Surgical History:  ?Procedure Laterality Date  ? ABDOMINAL HYSTERECTOMY    ? fribroids  ? BREAST LUMPECTOMY    ? benign  ? COLONOSCOPY  09/2010; 2007  ? tubular adenoma, Dr. Verl Blalock  ? POLYPECTOMY    ? ?Social History  ? ?Socioeconomic History  ? Marital status: Divorced  ?  Spouse name: Not on file  ? Number of children: Not on file  ? Years of education: Not on file  ? Highest education level: Not on file  ?Occupational History  ? Not on file  ?Tobacco Use  ? Smoking status: Former  ?  Types: Cigarettes  ?  Quit date: 32  ?  Years since quitting: 29.2  ? Smokeless tobacco: Never  ?Vaping Use  ? Vaping Use: Never used  ?Substance and Sexual Activity  ? Alcohol use: No  ?  Alcohol/week: 0.0 standard drinks  ? Drug use: No  ? Sexual activity: Not Currently  ?Other Topics Concern  ? Not on file  ?Social History Narrative  ? Not on file  ? ?Social Determinants of Health  ? ?Financial Resource Strain: Not on file  ?Food Insecurity: Not on file  ?Transportation Needs: Not on file  ?Physical Activity: Not on file  ?Stress: Not on file  ?Social Connections: Not on file  ? ?Family History  ?Problem Relation Age of Onset  ? Colon polyps Mother 68  ?     adenomatous polyps,ascending mass  ? Hypertension Mother   ? Diabetes Mother   ? Hypertension Father   ? Diabetes Father   ? Diabetes Sister   ? Diabetes Brother   ? Heart disease Neg Hx   ? Hyperlipidemia Neg Hx   ? Colon cancer Neg Hx   ?  Esophageal cancer Neg Hx   ? Rectal cancer Neg Hx   ? Stomach cancer Neg Hx   ? ?Allergies  ?Allergen Reactions  ? Lisinopril   ?  cough  ? Penicillins Hives  ? Sulfonamide Derivatives Hives  ? ?Prior to Admission medications   ?Medication Sig Start Date End Date Taking? Authorizing Provider  ?Calcium-Vitamin D (CALTRATE 600 PLUS-VIT D PO) Take 1 tablet by mouth daily.   Yes [provider]  ?latanoprost (XALATAN) 0.005 % ophthalmic solution Place 1 drop into both eyes at bedtime.   Yes [provider]  ?losartan-hydrochlorothiazide (HYZAAR) 50-12.5 MG tablet Take 1 tablet by mouth daily. 12/03/20  Yes Denita Lung, MD  ?Multiple Vitamins-Minerals (MULTIVITAMIN WITH MINERALS) tablet Take 1 tablet by mouth daily.   Yes [provider]  ?aspirin EC 81 MG tablet Take 1 tablet (81 mg total) by mouth 2 (two) times daily. To be taken after surgery to prevent blood clots 07/10/21   Aundra Dubin, PA-C  ?docusate sodium (COLACE) 100 MG capsule Take 1 capsule (100 mg total)  by mouth daily as needed. 07/10/21 07/10/22  Aundra Dubin, PA-C  ?meloxicam (MOBIC) 7.5 MG tablet Take 1 tablet (7.5 mg total) by mouth 2 (two) times daily as needed for pain. ?Patient not taking: Reported on 06/17/2021 05/01/21   Leandrew Koyanagi, MD  ?methocarbamol (ROBAXIN) 500 MG tablet Take 1 tablet (500 mg total) by mouth 2 (two) times daily as needed. 07/10/21   Aundra Dubin, PA-C  ?ondansetron (ZOFRAN) 4 MG tablet Take 1 tablet (4 mg total) by mouth every 8 (eight) hours as needed for nausea or vomiting. 07/10/21   Aundra Dubin, PA-C  ?oxyCODONE-acetaminophen (PERCOCET) 5-325 MG tablet Take 1-2 tablets by mouth every 6 (six) hours as needed. 07/10/21   Aundra Dubin, PA-C  ?potassium chloride (KLOR-CON M) 10 MEQ tablet Take 4 pills twice daily x 4 days 07/10/21   Aundra Dubin, PA-C  ?psyllium (METAMUCIL SMOOTH TEXTURE) 28 % packet Take 1 packet by mouth 2 (two) times daily.    [provider]   ? ? ? ?Positive ROS: All other systems have been reviewed and were otherwise negative with the exception of those mentioned in the HPI and as above. ? ?Physical Exam: ?General: Alert, no acute distress ?Cardiovascular: No pedal edema ?Respiratory: No cyanosis, no use of accessory musculature ?GI: abdomen soft ?Skin: No lesions in the area of chief complaint ?Neurologic: Sensation intact distally ?Psychiatric: Patient is competent for consent with normal mood and affect ?Lymphatic: no lymphedema ? ?MUSCULOSKELETAL: exam stable ? ?Assessment: ?right hip osteoarthritis ? ?Plan: ?Plan for Procedure(s): ?RIGHT TOTAL HIP ARTHROPLASTY ANTERIOR APPROACH ? ?The risks benefits and alternatives were discussed with the patient including but not limited to the risks of nonoperative treatment, versus surgical intervention including infection, bleeding, nerve injury,  blood clots, cardiopulmonary complications, morbidity, mortality, among others, and they were willing to proceed.  ? ?Preoperative templating of the joint replacement has been completed, documented, and submitted to the Operating Room personnel in order to optimize intra-operative equipment management. ? ? ?Eduard Roux, MD ?07/14/2021 ?5:59 AM ? ?

## 2021-07-14 NOTE — Anesthesia Procedure Notes (Addendum)
Spinal ? ?Patient location during procedure: OR ?Start time: 07/14/2021 7:19 AM ?End time: 07/14/2021 7:22 AM ?Reason for block: surgical anesthesia ?Staffing ?Performed: anesthesiologist  ?Anesthesiologist: Santa Lighter, MD ?Preanesthetic Checklist ?Completed: patient identified, IV checked, risks and benefits discussed, surgical consent, monitors and equipment checked, pre-op evaluation and timeout performed ?Spinal Block ?Patient position: sitting ?Prep: DuraPrep and site prepped and draped ?Patient monitoring: continuous pulse ox and blood pressure ?Approach: midline ?Location: L3-4 ?Injection technique: single-shot ?Needle ?Needle type: Pencan  ?Needle gauge: 24 G ?Assessment ?Sensory level: T6 ?Events: CSF return ?Additional Notes ?Functioning IV was confirmed and monitors were applied. Sterile prep and drape, including hand hygiene, mask and sterile gloves were used. The patient was positioned and the spine was prepped. The skin was anesthetized with lidocaine.  Free flow of clear CSF was obtained prior to injecting local anesthetic into the CSF.  The spinal needle aspirated freely following injection.  The needle was carefully withdrawn.  The patient tolerated the procedure well. Consent was obtained prior to procedure with all questions answered and concerns addressed. Risks including but not limited to bleeding, infection, nerve damage, paralysis, failed block, inadequate analgesia, allergic reaction, high spinal, itching and headache were discussed and the patient wished to proceed.  ? ?Hoy Morn, MD ? ? ? ?

## 2021-07-14 NOTE — Transfer of Care (Signed)
Immediate Anesthesia Transfer of Care Note ? ?Patient: Lisa Hutchinson ? ?Procedure(s) Performed: RIGHT TOTAL HIP ARTHROPLASTY ANTERIOR APPROACH (Right: Hip) ? ?Patient Location: PACU ? ?Anesthesia Type:MAC and Spinal ? ?Level of Consciousness: awake, alert  and oriented ? ?Airway & Oxygen Therapy: Patient Spontanous Breathing ? ?Post-op Assessment: Report given to RN and Post -op Vital signs reviewed and stable ? ?Post vital signs: Reviewed and stable ? ?Last Vitals:  ?Vitals Value Taken Time  ?BP 112/79 07/14/21 0904  ?Temp    ?Pulse 68 07/14/21 0904  ?Resp 13 07/14/21 0904  ?SpO2 92 % 07/14/21 0904  ?Vitals shown include unvalidated device data. ? ?Last Pain:  ?Vitals:  ? 07/14/21 0548  ?TempSrc: Oral  ?   ? ?  ? ?Complications: No notable events documented. ?

## 2021-07-14 NOTE — Anesthesia Postprocedure Evaluation (Signed)
Anesthesia Post Note ? ?Patient: Lisa Hutchinson ? ?Procedure(s) Performed: RIGHT TOTAL HIP ARTHROPLASTY ANTERIOR APPROACH (Right: Hip) ? ?  ? ?Patient location during evaluation: PACU ?Anesthesia Type: Spinal ?Level of consciousness: awake, awake and alert and oriented ?Pain management: pain level controlled ?Vital Signs Assessment: post-procedure vital signs reviewed and stable ?Respiratory status: spontaneous breathing, nonlabored ventilation and respiratory function stable ?Cardiovascular status: blood pressure returned to baseline and stable ?Postop Assessment: no headache, no backache, spinal receding and no apparent nausea or vomiting ?Anesthetic complications: no ? ? ?No notable events documented. ? ?Last Vitals:  ?Vitals:  ? 07/14/21 1020 07/14/21 1048  ?BP: 140/83 125/85  ?Pulse: (!) 46 (!) 56  ?Resp: 13 18  ?Temp: 36.8 ?C   ?SpO2: 96% 98%  ?  ?Last Pain:  ?Vitals:  ? 07/14/21 1020  ?TempSrc:   ?PainSc: 0-No pain  ? ? ?  ?  ?  ?  ?  ?  ? ?Santa Lighter ? ? ? ? ?

## 2021-07-14 NOTE — Discharge Instructions (Signed)

## 2021-07-15 ENCOUNTER — Other Ambulatory Visit: Payer: Self-pay | Admitting: Physician Assistant

## 2021-07-15 DIAGNOSIS — Z79899 Other long term (current) drug therapy: Secondary | ICD-10-CM | POA: Diagnosis not present

## 2021-07-15 DIAGNOSIS — Z87891 Personal history of nicotine dependence: Secondary | ICD-10-CM | POA: Diagnosis not present

## 2021-07-15 DIAGNOSIS — M1611 Unilateral primary osteoarthritis, right hip: Secondary | ICD-10-CM | POA: Diagnosis not present

## 2021-07-15 DIAGNOSIS — Z7982 Long term (current) use of aspirin: Secondary | ICD-10-CM | POA: Diagnosis not present

## 2021-07-15 DIAGNOSIS — I10 Essential (primary) hypertension: Secondary | ICD-10-CM | POA: Diagnosis not present

## 2021-07-15 LAB — CBC
HCT: 34.4 % — ABNORMAL LOW (ref 36.0–46.0)
Hemoglobin: 11.4 g/dL — ABNORMAL LOW (ref 12.0–15.0)
MCH: 28.9 pg (ref 26.0–34.0)
MCHC: 33.1 g/dL (ref 30.0–36.0)
MCV: 87.3 fL (ref 80.0–100.0)
Platelets: 202 10*3/uL (ref 150–400)
RBC: 3.94 MIL/uL (ref 3.87–5.11)
RDW: 12.8 % (ref 11.5–15.5)
WBC: 13.6 10*3/uL — ABNORMAL HIGH (ref 4.0–10.5)
nRBC: 0 % (ref 0.0–0.2)

## 2021-07-15 LAB — BASIC METABOLIC PANEL
Anion gap: 7 (ref 5–15)
BUN: 9 mg/dL (ref 8–23)
CO2: 23 mmol/L (ref 22–32)
Calcium: 8.6 mg/dL — ABNORMAL LOW (ref 8.9–10.3)
Chloride: 101 mmol/L (ref 98–111)
Creatinine, Ser: 0.77 mg/dL (ref 0.44–1.00)
GFR, Estimated: >60 mL/min (ref >60)
Glucose, Bld: 124 mg/dL — ABNORMAL HIGH (ref 70–99)
Potassium: 3.8 mmol/L (ref 3.5–5.1)
Sodium: 131 mmol/L — ABNORMAL LOW (ref 135–145)

## 2021-07-15 MED ORDER — HYDROCODONE-ACETAMINOPHEN 5-325 MG PO TABS: 1.0000 | ORAL_TABLET | ORAL | 0 refills | Status: DC | PRN

## 2021-07-15 NOTE — Care Plan (Signed)
Ortho Bundle Case Management Note ? ?Patient Details  ?Name: Lisa Hutchinson ?MRN: [id number] ?Date of Birth: 1950/09/24 ? ?[facility] RNCM call to patient to discuss her upcoming Right total hip arthroplasty with Dr. Erlinda Hong on Monday, 07/14/21. She is an Ortho bundle patient through [facility] and is agreeable to case management. She lives alone, but has a daughter and son that will be taking turns staying with her after discharge. She will need a RW and 3in1/BSC. These will be provided at hospital through [facility] by [facility] staff. Anticipate HHPT will be needed after a short hospital stay. Referral made to [facility] after choice provided. Reviewed all post op care instructions. Will continue to follow for needs.                ? ? ? ?DME Arranged:  3-N-1, Walker rolling (Patient has elevated toilet seats in home, but would like a 3in1 for handrails.) ?DME Agency:  [facility] ? ?HH Arranged:  PT ?[place] Agency:  [place] ? ?Additional Comments: ?Please contact me with any questions of if this plan should need to change. ? ?Jamse Arn, RN, BSN, [employer]  [phone] ?07/15/2021, 8:46 AM ?  ?

## 2021-07-15 NOTE — Discharge Summary (Addendum)
Patient ID: ?Lendell Caprice ?MRN: [id number] ?DOB/AGE: 69/21/1952 69 y.o. ? ?Admit date: 07/14/2021 ?Discharge date: 07/15/2021 ? ?Admission Diagnoses:  ?Principal Problem: ?  Primary osteoarthritis of right hip ?Active Problems: ?  Status post total hip replacement, right ? ? ?Discharge Diagnoses:  ?Same ? ?Past Medical History:  ?Diagnosis Date  ? Anemia   ? with pregnancy   ? Arthritis   ? knees  ? Atrophic vaginitis   ? Cataract   ? bilateral  ? Edema   ? H/O mammogram 05/03/12  ? Hyperlipidemia   ? no medicines   ? Hypertension   ? Hypokalemia   ? Sleep difficulties   ? sleep study normal, fall 2013  ? ? ?Surgeries: Procedure(s): ?RIGHT TOTAL HIP ARTHROPLASTY ANTERIOR APPROACH on 07/14/2021 ?  ?Consultants:  ? ?Discharged Condition: Improved ? ?Hospital Course: GLENDOLA FRIEDHOFF is an 71 y.o. female who was admitted 07/14/2021 for operative treatment ofPrimary osteoarthritis of right hip. Patient has severe unremitting pain that affects sleep, daily activities, and work/hobbies. After pre-op clearance the patient was taken to the operating room on 07/14/2021 and underwent  Procedure(s): ?RIGHT TOTAL HIP ARTHROPLASTY ANTERIOR APPROACH.   ? ?Patient was given perioperative antibiotics:  ?Anti-infectives (From admission, onward)  ? ? Start     Dose/Rate Route Frequency Ordered Stop  ? 07/14/21 1600  ceFAZolin (ANCEF) IVPB 2g/100 mL premix       ? 2 g ?200 mL/hr over 30 Minutes Intravenous Every 6 hours 07/14/21 1057 07/14/21 2203  ? 07/14/21 0752  vancomycin (VANCOCIN) powder  Status:  Discontinued       ?   As needed 07/14/21 0752 07/14/21 0903  ? 07/14/21 0615  ceFAZolin (ANCEF) IVPB 2g/100 mL premix       ?Note to Pharmacy: Anesthesia to give preop  ? 2 g ?200 mL/hr over 30 Minutes Intravenous  Once 07/14/21 0603 07/14/21 0735  ? 07/14/21 0600  clindamycin (CLEOCIN) IVPB 900 mg  Status:  Discontinued       ? 900 mg ?100 mL/hr over 30 Minutes Intravenous On call to O.R. 07/14/21 0102 07/14/21 0603  ? ?  ?  ? ?Patient  was given sequential compression devices, early ambulation, and chemoprophylaxis to prevent DVT. ? ?Patient benefited maximally from hospital stay and there were no complications.   ? ?Recent vital signs: Patient Vitals for the past 24 hrs: ? BP Temp Temp src Pulse Resp SpO2  ?07/15/21 0732 127/63 98 ?F (36.7 ?C) Oral 82 18 98 %  ?07/15/21 0423 120/70 -- -- 81 18 94 %  ?07/14/21 2338 (!) 149/82 98.5 ?F (36.9 ?C) Oral 94 18 96 %  ?07/14/21 2034 (!) 145/81 98.2 ?F (36.8 ?C) Oral 93 18 100 %  ?07/14/21 1621 (!) 152/87 97.9 ?F (36.6 ?C) -- 70 19 96 %  ?  ? ?Recent laboratory studies:  ?Recent Labs  ?  07/15/21 ?0738  ?WBC 13.6*  ?HGB 11.4*  ?HCT 34.4*  ?PLT 202  ?NA 131*  ?K 3.8  ?CL 101  ?CO2 23  ?BUN 9  ?CREATININE 0.77  ?GLUCOSE 124*  ?CALCIUM 8.6*  ? ? ? ?Discharge Medications:   ?Allergies as of 07/15/2021   ? ?   Reactions  ? Lisinopril   ? cough  ? Penicillins Hives  ? Sulfonamide Derivatives Hives  ? ?  ? ?  ?Medication List  ?  ? ?STOP taking these medications   ? ?oxyCODONE-acetaminophen 5-325 MG tablet ?Commonly known as: Percocet ?  ?potassium chloride 10  MEQ tablet ?Commonly known as: KLOR-CON M ?  ? ?  ? ?TAKE these medications   ? ?aspirin EC 81 MG tablet ?Take 1 tablet (81 mg total) by mouth 2 (two) times daily. To be taken after surgery to prevent blood clots ?  ?CALTRATE 600 PLUS-VIT D PO ?Take 1 tablet by mouth daily. ?  ?docusate sodium 100 MG capsule ?Commonly known as: Colace ?Take 1 capsule (100 mg total) by mouth daily as needed. ?  ?HYDROcodone-acetaminophen 5-325 MG tablet ?Commonly known as: Norco ?Take 1 tablet by mouth every 4 (four) hours as needed. ?  ?latanoprost 0.005 % ophthalmic solution ?Commonly known as: XALATAN ?Place 1 drop into both eyes at bedtime. ?  ?losartan-hydrochlorothiazide 50-12.5 MG tablet ?Commonly known as: HYZAAR ?Take 1 tablet by mouth daily. ?  ?methocarbamol 500 MG tablet ?Commonly known as: Robaxin ?Take 1 tablet (500 mg total) by mouth 2 (two) times daily as  needed. ?  ?multivitamin with minerals tablet ?Take 1 tablet by mouth daily. ?  ?ondansetron 4 MG tablet ?Commonly known as: Zofran ?Take 1 tablet (4 mg total) by mouth every 8 (eight) hours as needed for nausea or vomiting. ?  ?psyllium 28 % packet ?Commonly known as: METAMUCIL SMOOTH TEXTURE ?Take 1 packet by mouth 2 (two) times daily. ?  ?vitamin B-12 500 MCG tablet ?Commonly known as: CYANOCOBALAMIN ?Take 500 mcg by mouth daily. ?  ? ?  ? ?  ?  ? ? ?  ?Durable Medical Equipment  ?(From admission, onward)  ?  ? ? ?  ? ?  Start     Ordered  ? 07/14/21 1057  DME Walker rolling  Once       ?Question:  Patient needs a walker to treat with the following condition  Answer:  History of hip replacement  ? 07/14/21 1057  ? 07/14/21 1057  DME 3 n 1  Once       ? 07/14/21 1057  ? 07/14/21 1057  DME Bedside commode  Once       ?Question:  Patient needs a bedside commode to treat with the following condition  Answer:  History of hip replacement  ? 07/14/21 1057  ? ?  ?  ? ?  ? ? ?Diagnostic Studies: DG Pelvis Portable ? ?Result Date: 07/14/2021 ?CLINICAL DATA:  Hip joint replacement EXAM: PORTABLE PELVIS 1-2 VIEWS COMPARISON:  09/13/2021 FINDINGS: There is a right total hip arthroplasty in normal alignment out evidence of loosening or periprosthetic fracture. Mild left hip osteoarthritis. Expected soft tissue changes. IMPRESSION: Right total hip arthroplasty without evidence of immediate hardware complication. Electronically Signed   By: Maurine Simmering M.D.   On: 07/14/2021 09:35  ? ?DG C-Arm 1-60 Min-No Report ? ?Result Date: 07/14/2021 ?Fluoroscopy was utilized by the requesting physician.  No radiographic interpretation.  ? ?DG HIP UNILAT WITH PELVIS 1V RIGHT ? ?Result Date: 07/14/2021 ?CLINICAL DATA:  Right hip arthroplasty EXAM: DG HIP (WITH OR WITHOUT PELVIS) 1V RIGHT COMPARISON:  Hip radiographs 05/01/2021 FINDINGS: Six C-arm fluoroscopic images were obtained intraoperatively and submitted for post operative interpretation.  Intraoperative images demonstrate postsurgical changes reflecting right total hip arthroplasty. Hardware alignment is within expected limits, without evidence of complication. Left hip alignment is stable. Fluoro time 19 seconds, dose 2.2 mGy. Please see the performing provider's procedural report for further detail. IMPRESSION: Status post right hip arthroplasty without evidence of immediate complication. Electronically Signed   By: Valetta Mole M.D.   On: 07/14/2021 08:39   ? ?  Disposition: Discharge disposition: 01-Home or Self Care ? ? ? ? ? ? ? ? ? Follow-up Information   ? ? Leandrew Koyanagi, MD. Go on 07/29/2021.   ?Specialty: Orthopedic Surgery ?Why: at 1:00 pm for your 2 week post op appointment in Dr. Phoebe Sharps office ?Contact information: ?[address] ?[place] [address] ?[phone] ? ? ?  ?  ? ? [facility] Follow up.   ?Specialty: Home Health Services ?Contact information: ?[address] ?[address] ?[place] [address] ?[phone] ? ? ?  ?  ? ?  ?  ? ?  ? ? ? ?Signed: ?Aundra Dubin ?07/15/2021, 12:44 PM ? ? ? ? ?

## 2021-07-15 NOTE — Progress Notes (Signed)
Subjective: ?1 Day Post-Op Procedure(s) (LRB): ?RIGHT TOTAL HIP ARTHROPLASTY ANTERIOR APPROACH (Right) ?Patient reports pain as mild.  Nausea/vomiting from what sounds like oxycontin ? ?Objective: ?Vital signs in last 24 hours: ?Temp:  [97.9 ?F (36.6 ?C)-98.5 ?F (36.9 ?C)] 98 ?F (36.7 ?C) (03/21 0732) ?Pulse Rate:  [46-94] 82 (03/21 0732) ?Resp:  [12-19] 18 (03/21 0732) ?BP: (112-152)/(63-87) 127/63 (03/21 0732) ?SpO2:  [92 %-100 %] 98 % (03/21 0732) ? ?Intake/Output from previous day: ?03/20 0701 - 03/21 0700 ?In: 2393 [P.O.:1050; I.V.:793; IV Piggyback:400] ?Out: 1250 [Urine:1100; Blood:150] ?Intake/Output this shift: ?No intake/output data recorded. ? ?No results for input(s): HGB in the last 72 hours. ?No results for input(s): WBC, RBC, HCT, PLT in the last 72 hours. ?No results for input(s): NA, K, CL, CO2, BUN, CREATININE, GLUCOSE, CALCIUM in the last 72 hours. ?No results for input(s): LABPT, INR in the last 72 hours. ? ?Neurologically intact ?Neurovascular intact ?Sensation intact distally ?Intact pulses distally ?Dorsiflexion/Plantar flexion intact ?Incision: dressing C/D/I ?No cellulitis present ?Compartment soft ? ? ?Assessment/Plan: ?1 Day Post-Op Procedure(s) (LRB): ?RIGHT TOTAL HIP ARTHROPLASTY ANTERIOR APPROACH (Right) ?Advance diet ?Up with therapy ?WBAT RLE ?Awaiting labs ?D/c after PT as long as continues to mobilize ? ? ? ? ? ? ?Aundra Dubin ?07/15/2021, 8:09 AM ? ?

## 2021-07-15 NOTE — Progress Notes (Signed)
Physical Therapy Treatment ?Patient Details ?Name: Lisa Hutchinson ?MRN: [id number] ?DOB: 28-Mar-1951 ?Today's Date: 07/15/2021 ? ? ?History of Present Illness Pt is a 71 yo female presenting s/p R THA direct anterior approach. PMH includes Bilateral cataracts, HTN, HLD, breast lumpectomy, osteopenia. ? ?  ?PT Comments  ? ? Pt progressing towards goals. Reviewed stair training with family and supine/standing HEP. Requiring min guard to supervision for mobility tasks using RW. Pt eager to d/c home. Will continue to follow acutely.    ?Recommendations for follow up therapy are one component of a multi-disciplinary discharge planning process, led by the attending physician.  Recommendations may be updated based on patient status, additional functional criteria and insurance authorization. ? ?Follow Up Recommendations ? Follow physician's recommendations for discharge plan and follow up therapies ?  ?  ?Assistance Recommended at Discharge Frequent or constant Supervision/Assistance  ?Patient can return home with the following A little help with bathing/dressing/bathroom;Assistance with cooking/housework;Assist for transportation;Help with stairs or ramp for entrance ?  ?Equipment Recommendations ? Rolling walker (2 wheels)  ?  ?Recommendations for Other Services   ? ? ?  ?Precautions / Restrictions Precautions ?Precautions: Fall ?Restrictions ?Weight Bearing Restrictions: Yes ?RLE Weight Bearing: Weight bearing as tolerated  ?  ? ?Mobility ? Bed Mobility ?Overal bed mobility: Needs Assistance ?Bed Mobility: Supine to Sit, Sit to Supine ?  ?  ?Supine to sit: Supervision ?Sit to supine: Min assist ?  ?General bed mobility comments: Min A for LE assist back into bed ?  ? ?Transfers ?Overall transfer level: Needs assistance ?Equipment used: Rolling walker (2 wheels) ?Transfers: Sit to/from Stand ?Sit to Stand: Supervision ?  ?  ?  ?  ?  ?General transfer comment: Supervision for safety. ?  ? ?Ambulation/Gait ?Ambulation/Gait  assistance: Supervision ?Gait Distance (Feet): 120 Feet ?Assistive device: Rolling walker (2 wheels) ?Gait Pattern/deviations: Step-through pattern, Decreased stride length, Trendelenburg ?  ?  ?  ?General Gait Details: Slow, steady gait with cues for proper gait mechanics, heel strike at initial contact, stiffness improved with distance. ? ? ?Stairs ?  ?Stairs assistance: Min guard ?Stair Management: Backwards, With walker, Step to pattern ?Number of Stairs: 3 (X2 times) ?General stair comments: Cues for technique/safety. PT stabilizing RW as pt ascends backwards. Allowed family to practice bracing RW and family reports comfort with assisting ? ? ?Wheelchair Mobility ?  ? ?Modified Rankin (Stroke Patients Only) ?  ? ? ?  ?Balance Overall balance assessment: Needs assistance ?Sitting-balance support: Feet supported, No upper extremity supported ?Sitting balance-Leahy Scale: Good ?  ?  ?Standing balance support: Bilateral upper extremity supported ?Standing balance-Leahy Scale: Poor ?Standing balance comment: Reliant on BUE support ?  ?  ?  ?  ?  ?  ?  ?  ?  ?  ?  ?  ? ?  ?Cognition Arousal/Alertness: Awake/alert ?Behavior During Therapy: [facility] for tasks assessed/performed ?Overall Cognitive Status: Within Functional Limits for tasks assessed ?  ?  ?  ?  ?  ?  ?  ?  ?  ?  ?  ?  ?  ?  ?  ?  ?  ?  ?  ? ?  ?Exercises Total Joint Exercises ?Ankle Circles/Pumps: AROM, Both, 5 reps ?Quad Sets: PROM, Both, 5 reps ?Short Arc Quad: AROM, Right, 5 reps ?Heel Slides: AAROM, Right, 5 reps ?Hip ABduction/ADduction: AROM, Right, 5 reps, Supine (AROM X5 on RLE in standing using RW) ?Knee Flexion: AROM, Right, 5 reps, Standing (with RW) ?Marching in  Standing: AROM, Right, 5 reps (with RW) ?Standing Hip Extension: AROM, Right, 5 reps, Standing (using RW) ? ?  ?General Comments General comments (skin integrity, edema, etc.): Pt with emesis post walking when eating breakfast. ?  ?  ? ?Pertinent Vitals/Pain Pain Assessment ?Pain  Assessment: Faces ?Faces Pain Scale: Hurts a little bit ?Pain Location: R hip operative site ?Pain Descriptors / Indicators: Aching, Discomfort ?Pain Intervention(s): Limited activity within patient's tolerance, Monitored during session, Repositioned  ? ? ?Home Living   ?  ?  ?  ?  ?  ?  ?  ?  ?  ?   ?  ?Prior Function    ?  ?  ?   ? ?PT Goals (current goals can now be found in the care plan section) Acute Rehab PT Goals ?Patient Stated Goal: to go home ?PT Goal Formulation: With patient ?Time For Goal Achievement: 07/28/21 ?Potential to Achieve Goals: Good ?Progress towards PT goals: Progressing toward goals ? ?  ?Frequency ? ? ? 7X/week ? ? ? ?  ?PT Plan Current plan remains appropriate  ? ? ?Co-evaluation   ?  ?  ?  ?  ? ?  ?AM-PAC PT "6 Clicks" Mobility   ?Outcome Measure ? Help needed turning from your back to your side while in a flat bed without using bedrails?: None ?Help needed moving from lying on your back to sitting on the side of a flat bed without using bedrails?: None ?Help needed moving to and from a bed to a chair (including a wheelchair)?: None ?Help needed standing up from a chair using your arms (e.g., wheelchair or bedside chair)?: None ?Help needed to walk in hospital room?: A Little ?Help needed climbing 3-5 steps with a railing? : A Little ?6 Click Score: 22 ? ?  ?End of Session Equipment Utilized During Treatment: Gait belt ?Activity Tolerance: Patient tolerated treatment well ?Patient left: in bed;with call bell/phone within reach;with family/visitor present ?Nurse Communication: Mobility status ?PT Visit Diagnosis: Other abnormalities of gait and mobility (R26.89);Unsteadiness on feet (R26.81);Muscle weakness (generalized) (M62.81);Pain;Difficulty in walking, not elsewhere classified (R26.2) ?Pain - Right/Left: Right ?Pain - part of body: Hip ?  ? ? ?Time: 2035-5974 ?PT Time Calculation (min) (ACUTE ONLY): 28 min ? ?Charges:  $Gait Training: 8-22 mins ?$Therapeutic Exercise: 8-22 mins           ?          ? ?Reuel Derby, PT, DPT  ?Acute Rehabilitation Services  ?Pager: [phone] ?Office: [phone] ? ? ? ?[place] ?07/15/2021, 12:10 PM ? ?

## 2021-07-15 NOTE — Progress Notes (Signed)
Physical Therapy Treatment ?Patient Details ?Name: Lisa Hutchinson ?MRN: [id number] ?DOB: 09-03-1950 ?Today's Date: 07/15/2021 ? ? ?History of Present Illness Pt is a 71 yo female presenting s/p R THA direct anterior approach. PMH includes Bilateral cataracts, HTN, HLD, breast lumpectomy, osteopenia. ? ?  ?PT Comments  ? ? Patient progressing well towards PT goals. Reports feeling better this AM. Session focused on gait training and stair training with supervision and use of RW for support. Needed Min A for stair negotiation to stabilize RW when going backwards. Performed 3 times for comfort. Needs repetition for proper technique. Reviewed some there ex. Discussed importance of mobility, positioning, exercise. Will plan to review HEP handout next session and have family present to review stairs. Will follow. ?   ?Recommendations for follow up therapy are one component of a multi-disciplinary discharge planning process, led by the attending physician.  Recommendations may be updated based on patient status, additional functional criteria and insurance authorization. ? ?Follow Up Recommendations ? Follow physician's recommendations for discharge plan and follow up therapies ?  ?  ?Assistance Recommended at Discharge Frequent or constant Supervision/Assistance  ?Patient can return home with the following A little help with bathing/dressing/bathroom;Assistance with cooking/housework;Assist for transportation;Help with stairs or ramp for entrance ?  ?Equipment Recommendations ? Rolling walker (2 wheels)  ?  ?Recommendations for Other Services   ? ? ?  ?Precautions / Restrictions Precautions ?Precautions: Fall ?Restrictions ?Weight Bearing Restrictions: Yes ?RLE Weight Bearing: Weight bearing as tolerated  ?  ? ?Mobility ? Bed Mobility ?Overal bed mobility: Needs Assistance ?Bed Mobility: Supine to Sit ?  ?  ?Supine to sit: Supervision ?  ?  ?General bed mobility comments: HOB flat, no assist needed, no use of rail to  simulate home. ?  ? ?Transfers ?Overall transfer level: Needs assistance ?Equipment used: Rolling walker (2 wheels) ?Transfers: Sit to/from Stand ?Sit to Stand: Supervision ?  ?  ?  ?  ?  ?General transfer comment: Supervision for safety. Stood from [employer], tx from chair post ambulation. ?  ? ?Ambulation/Gait ?Ambulation/Gait assistance: Supervision ?Gait Distance (Feet): 150 Feet ?Assistive device: Rolling walker (2 wheels) ?Gait Pattern/deviations: Step-through pattern, Decreased stride length, Trendelenburg ?  ?Gait velocity interpretation: <1.31 ft/sec, indicative of household ambulator ?  ?General Gait Details: Slow, steady gait with cues for proper gait mechanics, heel strike at initial contact, stiffness improved with distance. ? ? ?Stairs ?Stairs: Yes ?Stairs assistance: Min assist ?Stair Management: Backwards, With walker ?Number of Stairs: 3 (x3 times) ?General stair comments: Cues for technique/safety. PT stabilizing RW as pt ascends backwards. ? ? ?Wheelchair Mobility ?  ? ?Modified Rankin (Stroke Patients Only) ?  ? ? ?  ?Balance Overall balance assessment: Needs assistance ?Sitting-balance support: Feet supported, No upper extremity supported ?Sitting balance-Leahy Scale: Good ?  ?  ?Standing balance support: During functional activity ?Standing balance-Leahy Scale: Fair ?  ?  ?  ?  ?  ?  ?  ?  ?  ?  ?  ?  ?  ? ?  ?Cognition Arousal/Alertness: Awake/alert ?Behavior During Therapy: [facility] for tasks assessed/performed ?Overall Cognitive Status: Within Functional Limits for tasks assessed ?  ?  ?  ?  ?  ?  ?  ?  ?  ?  ?  ?  ?  ?  ?  ?  ?  ?  ?  ? ?  ?Exercises Total Joint Exercises ?Ankle Circles/Pumps: AROM, Both, 10 reps, Supine ?Quad Sets: AROM, Both, 10 reps, Supine ?  Long Arc Quad: AROM, Right, 10 reps, Seated ? ?  ?General Comments General comments (skin integrity, edema, etc.): Pt with emesis post walking when eating breakfast. ?  ?  ? ?Pertinent Vitals/Pain Pain Assessment ?Pain Assessment:  Faces ?Faces Pain Scale: Hurts a little bit ?Pain Location: R hip operative site ?Pain Descriptors / Indicators: Aching, Discomfort ?Pain Intervention(s): Monitored during session, Premedicated before session, Repositioned  ? ? ?Home Living   ?  ?  ?  ?  ?  ?  ?  ?  ?  ?   ?  ?Prior Function    ?  ?  ?   ? ?PT Goals (current goals can now be found in the care plan section) Progress towards PT goals: Progressing toward goals ? ?  ?Frequency ? ? ? 7X/week ? ? ? ?  ?PT Plan Current plan remains appropriate  ? ? ?Co-evaluation   ?  ?  ?  ?  ? ?  ?AM-PAC PT "6 Clicks" Mobility   ?Outcome Measure ? Help needed turning from your back to your side while in a flat bed without using bedrails?: None ?Help needed moving from lying on your back to sitting on the side of a flat bed without using bedrails?: None ?Help needed moving to and from a bed to a chair (including a wheelchair)?: A Little ?Help needed standing up from a chair using your arms (e.g., wheelchair or bedside chair)?: A Little ?Help needed to walk in hospital room?: A Little ?Help needed climbing 3-5 steps with a railing? : A Little ?6 Click Score: 20 ? ?  ?End of Session Equipment Utilized During Treatment: Gait belt ?Activity Tolerance: Patient tolerated treatment well ?Patient left: in chair;with call bell/phone within reach ?Nurse Communication: Mobility status ?PT Visit Diagnosis: Other abnormalities of gait and mobility (R26.89);Unsteadiness on feet (R26.81);Muscle weakness (generalized) (M62.81);Pain;Difficulty in walking, not elsewhere classified (R26.2) ?Pain - Right/Left: Right ?Pain - part of body: Hip ?  ? ? ?Time: 0867-6195 ?PT Time Calculation (min) (ACUTE ONLY): 14 min ? ?Charges:  $Gait Training: 8-22 mins          ?          ? ?Marisa Severin, PT, DPT ?Acute Rehabilitation Services ?Pager [phone] ?Office [phone] ? ? ? ? ? ?[place] ?07/15/2021, 8:12 AM ? ?

## 2021-07-15 NOTE — Plan of Care (Signed)
Pt and family given D/C instructions with verbal understanding. Rx's were sent to the pharmacy by MD. Pt's incision is clean and dry with no sign of infection. Pt's IV was removed prior to D/C. Pt received RW and 3-n-1 from [employer] per MD order. Pt D/C'd home via wheelchair per MD order. Pt is stable @ D/C and has no other needs at this time. Poppi Scantling, RN  

## 2021-07-16 ENCOUNTER — Telehealth: Payer: Self-pay | Admitting: *Deleted

## 2021-07-16 DIAGNOSIS — M170 Bilateral primary osteoarthritis of knee: Secondary | ICD-10-CM | POA: Diagnosis not present

## 2021-07-16 DIAGNOSIS — Z96641 Presence of right artificial hip joint: Secondary | ICD-10-CM | POA: Diagnosis not present

## 2021-07-16 DIAGNOSIS — Z471 Aftercare following joint replacement surgery: Secondary | ICD-10-CM | POA: Diagnosis not present

## 2021-07-16 DIAGNOSIS — E785 Hyperlipidemia, unspecified: Secondary | ICD-10-CM | POA: Diagnosis not present

## 2021-07-16 DIAGNOSIS — Z9181 History of falling: Secondary | ICD-10-CM | POA: Diagnosis not present

## 2021-07-16 DIAGNOSIS — Z87891 Personal history of nicotine dependence: Secondary | ICD-10-CM | POA: Diagnosis not present

## 2021-07-16 DIAGNOSIS — I10 Essential (primary) hypertension: Secondary | ICD-10-CM | POA: Diagnosis not present

## 2021-07-16 DIAGNOSIS — Z9010 Acquired absence of unspecified breast and nipple: Secondary | ICD-10-CM | POA: Diagnosis not present

## 2021-07-16 DIAGNOSIS — D649 Anemia, unspecified: Secondary | ICD-10-CM | POA: Diagnosis not present

## 2021-07-16 DIAGNOSIS — Z90710 Acquired absence of both cervix and uterus: Secondary | ICD-10-CM | POA: Diagnosis not present

## 2021-07-16 DIAGNOSIS — Z7982 Long term (current) use of aspirin: Secondary | ICD-10-CM | POA: Diagnosis not present

## 2021-07-16 DIAGNOSIS — H269 Unspecified cataract: Secondary | ICD-10-CM | POA: Diagnosis not present

## 2021-07-16 NOTE — Telephone Encounter (Signed)
Ortho bundle D/C call completed. 

## 2021-07-18 DIAGNOSIS — I10 Essential (primary) hypertension: Secondary | ICD-10-CM | POA: Diagnosis not present

## 2021-07-18 DIAGNOSIS — M170 Bilateral primary osteoarthritis of knee: Secondary | ICD-10-CM | POA: Diagnosis not present

## 2021-07-18 DIAGNOSIS — H269 Unspecified cataract: Secondary | ICD-10-CM | POA: Diagnosis not present

## 2021-07-18 DIAGNOSIS — D649 Anemia, unspecified: Secondary | ICD-10-CM | POA: Diagnosis not present

## 2021-07-18 DIAGNOSIS — E785 Hyperlipidemia, unspecified: Secondary | ICD-10-CM | POA: Diagnosis not present

## 2021-07-18 DIAGNOSIS — Z471 Aftercare following joint replacement surgery: Secondary | ICD-10-CM | POA: Diagnosis not present

## 2021-07-21 ENCOUNTER — Telehealth: Payer: Self-pay | Admitting: *Deleted

## 2021-07-21 DIAGNOSIS — E785 Hyperlipidemia, unspecified: Secondary | ICD-10-CM | POA: Diagnosis not present

## 2021-07-21 DIAGNOSIS — D649 Anemia, unspecified: Secondary | ICD-10-CM | POA: Diagnosis not present

## 2021-07-21 DIAGNOSIS — I10 Essential (primary) hypertension: Secondary | ICD-10-CM | POA: Diagnosis not present

## 2021-07-21 DIAGNOSIS — M170 Bilateral primary osteoarthritis of knee: Secondary | ICD-10-CM | POA: Diagnosis not present

## 2021-07-21 DIAGNOSIS — H269 Unspecified cataract: Secondary | ICD-10-CM | POA: Diagnosis not present

## 2021-07-21 DIAGNOSIS — Z471 Aftercare following joint replacement surgery: Secondary | ICD-10-CM | POA: Diagnosis not present

## 2021-07-21 NOTE — Telephone Encounter (Signed)
Ortho bundle 7 day call completed. 

## 2021-07-23 DIAGNOSIS — M170 Bilateral primary osteoarthritis of knee: Secondary | ICD-10-CM | POA: Diagnosis not present

## 2021-07-23 DIAGNOSIS — Z471 Aftercare following joint replacement surgery: Secondary | ICD-10-CM | POA: Diagnosis not present

## 2021-07-23 DIAGNOSIS — H269 Unspecified cataract: Secondary | ICD-10-CM | POA: Diagnosis not present

## 2021-07-23 DIAGNOSIS — D649 Anemia, unspecified: Secondary | ICD-10-CM | POA: Diagnosis not present

## 2021-07-23 DIAGNOSIS — I10 Essential (primary) hypertension: Secondary | ICD-10-CM | POA: Diagnosis not present

## 2021-07-23 DIAGNOSIS — E785 Hyperlipidemia, unspecified: Secondary | ICD-10-CM | POA: Diagnosis not present

## 2021-07-25 DIAGNOSIS — H269 Unspecified cataract: Secondary | ICD-10-CM | POA: Diagnosis not present

## 2021-07-25 DIAGNOSIS — M170 Bilateral primary osteoarthritis of knee: Secondary | ICD-10-CM | POA: Diagnosis not present

## 2021-07-25 DIAGNOSIS — I10 Essential (primary) hypertension: Secondary | ICD-10-CM | POA: Diagnosis not present

## 2021-07-25 DIAGNOSIS — E785 Hyperlipidemia, unspecified: Secondary | ICD-10-CM | POA: Diagnosis not present

## 2021-07-25 DIAGNOSIS — Z471 Aftercare following joint replacement surgery: Secondary | ICD-10-CM | POA: Diagnosis not present

## 2021-07-25 DIAGNOSIS — D649 Anemia, unspecified: Secondary | ICD-10-CM | POA: Diagnosis not present

## 2021-07-29 ENCOUNTER — Encounter: Payer: Self-pay | Admitting: Orthopaedic Surgery

## 2021-07-29 ENCOUNTER — Ambulatory Visit (INDEPENDENT_AMBULATORY_CARE_PROVIDER_SITE_OTHER): Payer: Medicare Other | Admitting: Physician Assistant

## 2021-07-29 ENCOUNTER — Telehealth: Payer: Self-pay | Admitting: *Deleted

## 2021-07-29 DIAGNOSIS — Z96641 Presence of right artificial hip joint: Secondary | ICD-10-CM

## 2021-07-29 NOTE — Telephone Encounter (Signed)
Ortho bundle 14 day in office meeting completed. °

## 2021-07-29 NOTE — Progress Notes (Signed)
? ?Post-Op Visit Note ?  ?Patient: Lisa Hutchinson           ?Date of Birth: 04-07-1951           ?MRN: [id number] ?Visit Date: 07/29/2021 ?PCP: Denita Lung, MD ? ? ?Assessment & Plan: ? ?Chief Complaint:  ?Chief Complaint  ?Patient presents with  ? Right Hip - Pain  ? ?Visit Diagnoses:  ?1. Status post total hip replacement, right   ? ? ?Plan: Patient is a pleasant 71 year old female who comes in today 2 weeks status post right total hip replacement 07/14/2021.  She has been doing well.  She is not in any pain or discomfort.  She has been getting home health physical therapy and is ambulating with a single-point cane.  She has been compliant taking a baby aspirin twice daily for DVT prophylaxis.  Examination of the right hip reveals a well-healed surgical incision with nylon sutures in place.  No evidence of infection or cellulitis.  Calf soft nontender.  She is neurovascular intact distally.  Today, sutures were removed and Steri-Strips applied.  She will continue with her home exercise program.  Dental prophylaxis reinforced.  Follow-up with [place] in 4 weeks time for repeat evaluation and AP pelvis x-rays. ? ?Follow-Up Instructions: Return in about 4 weeks (around 08/26/2021).  ? ?Orders:  ?No orders of the defined types were placed in this encounter. ? ?No orders of the defined types were placed in this encounter. ? ? ?Imaging: ?No new imaging ? ?PMFS History: ?Patient Active Problem List  ? Diagnosis Date Noted  ? Status post total hip replacement, right 07/14/2021  ? Primary osteoarthritis of right hip 07/13/2021  ? Chronic hip pain 05/16/2021  ? Urinary frequency 05/16/2021  ? Glaucoma 12/03/2020  ? ACE-inhibitor cough 11/30/2019  ? Osteopenia 12/20/2017  ? Arthritis 07/15/2015  ? Obesity (BMI 30-39.9) 07/15/2015  ? Atrophic vaginitis 08/10/2013  ? Hypertension 05/27/2011  ? Family history of colonic polyps 10/22/2010  ? Hx of adenomatous colonic polyps 10/22/2010  ? ?Past Medical History:  ?Diagnosis Date  ?  Anemia   ? with pregnancy   ? Arthritis   ? knees  ? Atrophic vaginitis   ? Cataract   ? bilateral  ? Edema   ? H/O mammogram 05/03/12  ? Hyperlipidemia   ? no medicines   ? Hypertension   ? Hypokalemia   ? Sleep difficulties   ? sleep study normal, fall 2013  ?  ?Family History  ?Problem Relation Age of Onset  ? Colon polyps Mother 46  ?     adenomatous polyps,ascending mass  ? Hypertension Mother   ? Diabetes Mother   ? Hypertension Father   ? Diabetes Father   ? Diabetes Sister   ? Diabetes Brother   ? Heart disease Neg Hx   ? Hyperlipidemia Neg Hx   ? Colon cancer Neg Hx   ? Esophageal cancer Neg Hx   ? Rectal cancer Neg Hx   ? Stomach cancer Neg Hx   ?  ?Past Surgical History:  ?Procedure Laterality Date  ? ABDOMINAL HYSTERECTOMY    ? fribroids  ? BREAST LUMPECTOMY    ? benign  ? COLONOSCOPY  09/2010; 2007  ? tubular adenoma, Dr. Verl Blalock  ? POLYPECTOMY    ? TOTAL HIP ARTHROPLASTY Right 07/14/2021  ? Procedure: RIGHT TOTAL HIP ARTHROPLASTY ANTERIOR APPROACH;  Surgeon: Leandrew Koyanagi, MD;  Location: [facility];  Service: Orthopedics;  Laterality: Right;  3-C  ? ?  Social History  ? ?Occupational History  ? Not on file  ?Tobacco Use  ? Smoking status: Former  ?  Types: Cigarettes  ?  Quit date: 71  ?  Years since quitting: 29.2  ? Smokeless tobacco: Never  ?Vaping Use  ? Vaping Use: Never used  ?Substance and Sexual Activity  ? Alcohol use: No  ?  Alcohol/week: 0.0 standard drinks  ? Drug use: No  ? Sexual activity: Not Currently  ? ? ? ?

## 2021-07-30 DIAGNOSIS — Z471 Aftercare following joint replacement surgery: Secondary | ICD-10-CM | POA: Diagnosis not present

## 2021-07-30 DIAGNOSIS — H269 Unspecified cataract: Secondary | ICD-10-CM | POA: Diagnosis not present

## 2021-07-30 DIAGNOSIS — E785 Hyperlipidemia, unspecified: Secondary | ICD-10-CM | POA: Diagnosis not present

## 2021-07-30 DIAGNOSIS — D649 Anemia, unspecified: Secondary | ICD-10-CM | POA: Diagnosis not present

## 2021-07-30 DIAGNOSIS — M170 Bilateral primary osteoarthritis of knee: Secondary | ICD-10-CM | POA: Diagnosis not present

## 2021-07-30 DIAGNOSIS — I10 Essential (primary) hypertension: Secondary | ICD-10-CM | POA: Diagnosis not present

## 2021-08-21 ENCOUNTER — Other Ambulatory Visit: Payer: Self-pay | Admitting: Family Medicine

## 2021-08-21 DIAGNOSIS — Z1231 Encounter for screening mammogram for malignant neoplasm of breast: Secondary | ICD-10-CM

## 2021-08-26 ENCOUNTER — Ambulatory Visit (INDEPENDENT_AMBULATORY_CARE_PROVIDER_SITE_OTHER): Payer: Medicare Other | Admitting: Orthopaedic Surgery

## 2021-08-26 ENCOUNTER — Encounter: Payer: Self-pay | Admitting: Orthopaedic Surgery

## 2021-08-26 ENCOUNTER — Ambulatory Visit (INDEPENDENT_AMBULATORY_CARE_PROVIDER_SITE_OTHER): Payer: Medicare Other

## 2021-08-26 ENCOUNTER — Encounter: Payer: Medicare Other | Admitting: Orthopaedic Surgery

## 2021-08-26 DIAGNOSIS — Z96641 Presence of right artificial hip joint: Secondary | ICD-10-CM

## 2021-08-26 NOTE — Progress Notes (Signed)
? ?Post-Op Visit Note ?  ?Patient: Lisa Hutchinson           ?Date of Birth: 1951/03/30           ?MRN: [id number] ?Visit Date: 08/26/2021 ?PCP: Denita Lung, MD ? ? ?Assessment & Plan: ? ?Chief Complaint:  ?Chief Complaint  ?Patient presents with  ? Right Hip - Pain  ? ?Visit Diagnoses:  ?1. Status post total hip replacement, right   ? ? ?Plan: Lisa Hutchinson is now 6 weeks status post right total hip replacement on 07/14/2021.  Doing well overall she is able to walk a mile.  No complaints. ? ?Examination of the right hip shows a fully healed surgical scar.  No signs infection.  Appropriate gait and ambulation. ? ?6 week THA follow up plan ? ?Patient presents for follow up 6 weeks status post total hip replacement. The wound is healed and there is no evidence of infection. TED hose may be discontinued. Radiographs reveal a total hip arthroplasty in good position, with no evidence of subsidence, loosening, or complicating features. It was reinforced that with any procedure including dental work, colonoscopy, or any invasive procedure that pre-procedural prophylactic antibiotics must be taken to decrease the risk of infection. Reminders were given about signs to be aware of including redness, drainage, increased pain, fevers, calf pain, shortness of breath, or any concern should generate a phone call or a return to see [place] immediately. Will plan to follow up at 3 months postoperatively for next evaluation with radiographs at that time. ? ?Follow-Up Instructions: Return in about 6 weeks (around 10/07/2021).  ? ?Orders:  ?Orders Placed This Encounter  ?Procedures  ? XR Pelvis 1-2 Views  ? ?No orders of the defined types were placed in this encounter. ? ? ?Imaging: ?XR Pelvis 1-2 Views ? ?Result Date: 08/26/2021 ?Stable total hip replacement without complications  ? ?PMFS History: ?Patient Active Problem List  ? Diagnosis Date Noted  ? Status post total hip replacement, right 07/14/2021  ? Primary osteoarthritis of right  hip 07/13/2021  ? Chronic hip pain 05/16/2021  ? Urinary frequency 05/16/2021  ? Glaucoma 12/03/2020  ? ACE-inhibitor cough 11/30/2019  ? Osteopenia 12/20/2017  ? Arthritis 07/15/2015  ? Obesity (BMI 30-39.9) 07/15/2015  ? Atrophic vaginitis 08/10/2013  ? Hypertension 05/27/2011  ? Family history of colonic polyps 10/22/2010  ? Hx of adenomatous colonic polyps 10/22/2010  ? ?Past Medical History:  ?Diagnosis Date  ? Anemia   ? with pregnancy   ? Arthritis   ? knees  ? Atrophic vaginitis   ? Cataract   ? bilateral  ? Edema   ? H/O mammogram 05/03/12  ? Hyperlipidemia   ? no medicines   ? Hypertension   ? Hypokalemia   ? Sleep difficulties   ? sleep study normal, fall 2013  ?  ?Family History  ?Problem Relation Age of Onset  ? Colon polyps Mother 39  ?     adenomatous polyps,ascending mass  ? Hypertension Mother   ? Diabetes Mother   ? Hypertension Father   ? Diabetes Father   ? Diabetes Sister   ? Diabetes Brother   ? Heart disease Neg Hx   ? Hyperlipidemia Neg Hx   ? Colon cancer Neg Hx   ? Esophageal cancer Neg Hx   ? Rectal cancer Neg Hx   ? Stomach cancer Neg Hx   ?  ?Past Surgical History:  ?Procedure Laterality Date  ? ABDOMINAL HYSTERECTOMY    ?  fribroids  ? BREAST LUMPECTOMY    ? benign  ? COLONOSCOPY  09/2010; 2007  ? tubular adenoma, Dr. Verl Blalock  ? POLYPECTOMY    ? TOTAL HIP ARTHROPLASTY Right 07/14/2021  ? Procedure: RIGHT TOTAL HIP ARTHROPLASTY ANTERIOR APPROACH;  Surgeon: Leandrew Koyanagi, MD;  Location: [facility];  Service: Orthopedics;  Laterality: Right;  3-C  ? ?Social History  ? ?Occupational History  ? Not on file  ?Tobacco Use  ? Smoking status: Former  ?  Types: Cigarettes  ?  Quit date: 11  ?  Years since quitting: 29.3  ? Smokeless tobacco: Never  ?Vaping Use  ? Vaping Use: Never used  ?Substance and Sexual Activity  ? Alcohol use: No  ?  Alcohol/week: 0.0 standard drinks  ? Drug use: No  ? Sexual activity: Not Currently  ? ? ? ?

## 2021-09-03 ENCOUNTER — Telehealth: Payer: Self-pay | Admitting: *Deleted

## 2021-09-03 NOTE — Telephone Encounter (Signed)
If she's able to weight bear then I would have her treat it symptomatically for a week and if not better then come in to see me.  Thanks.

## 2021-09-03 NOTE — Telephone Encounter (Signed)
Patient called and states she has fallen onto Right hip that had surgery back on 07/14/21. She was out walking her dog and fell. She is able to bear weight and said it's painful from fall itself, but doesn't really hurt to bear weight. Would you like to see her in office this week or have someone else see her today if we could? Thank you. ?

## 2021-09-03 NOTE — Telephone Encounter (Signed)
Patient notified of recommendations. 

## 2021-09-12 ENCOUNTER — Ambulatory Visit (INDEPENDENT_AMBULATORY_CARE_PROVIDER_SITE_OTHER): Payer: Medicare Other | Admitting: Orthopaedic Surgery

## 2021-09-12 ENCOUNTER — Telehealth: Payer: Self-pay | Admitting: *Deleted

## 2021-09-12 ENCOUNTER — Encounter: Payer: Self-pay | Admitting: Orthopaedic Surgery

## 2021-09-12 ENCOUNTER — Ambulatory Visit (INDEPENDENT_AMBULATORY_CARE_PROVIDER_SITE_OTHER): Payer: Medicare Other

## 2021-09-12 DIAGNOSIS — Z96641 Presence of right artificial hip joint: Secondary | ICD-10-CM

## 2021-09-12 NOTE — Telephone Encounter (Signed)
Ortho bundle 60 day in office visit today.

## 2021-09-12 NOTE — Progress Notes (Signed)
Post-Op Visit Note   Patient: Lisa Hutchinson           Date of Birth: Sep 24, 1950           MRN: [id number] Visit Date: 09/12/2021 PCP: Denita Lung, MD   Assessment & Plan:  Chief Complaint:  Chief Complaint  Patient presents with   Right Hip - Pain    Right total hip arthroplasty 07/14/21   Visit Diagnoses:  1. Status post total hip replacement, right     Plan: Ms. Dake comes in today for checkup of her right hip.  She had a fall about a week ago in her backyard when she was trying to restrain her dog from a neighbors dog that had run into her backyard.  She reports soreness and some tingling around the hip but she is able to walk without any significant pain or difficulty.  Examination of right hip shows a fully healed surgical scar.  She has some soreness to palpation in the soft tissues.  No motor or sensory deficits.  She is able to weight-bear and stand without any difficulty and she can get up from a chair without any difficulty or pain.  Painless range of motion of the hip.  The x-rays are normal.  Reassurance was provided she is doing okay and likely suffered a soft tissue contusion.  We will see her back as scheduled.  Follow-Up Instructions: No follow-ups on file.   Orders:  Orders Placed This Encounter  Procedures   XR HIP UNILAT W OR W/O PELVIS 2-3 VIEWS RIGHT   No orders of the defined types were placed in this encounter.   Imaging: No results found.  PMFS History: Patient Active Problem List   Diagnosis Date Noted   Status post total hip replacement, right 07/14/2021   Primary osteoarthritis of right hip 07/13/2021   Chronic hip pain 05/16/2021   Urinary frequency 05/16/2021   Glaucoma 12/03/2020   ACE-inhibitor cough 11/30/2019   Osteopenia 12/20/2017   Arthritis 07/15/2015   Obesity (BMI 30-39.9) 07/15/2015   Atrophic vaginitis 08/10/2013   Hypertension 05/27/2011   Family history of colonic polyps 10/22/2010   Hx of adenomatous  colonic polyps 10/22/2010   Past Medical History:  Diagnosis Date   Anemia    with pregnancy    Arthritis    knees   Atrophic vaginitis    Cataract    bilateral   Edema    H/O mammogram 05/03/12   Hyperlipidemia    no medicines    Hypertension    Hypokalemia    Sleep difficulties    sleep study normal, fall 2013    Family History  Problem Relation Age of Onset   Colon polyps Mother 88       adenomatous polyps,ascending mass   Hypertension Mother    Diabetes Mother    Hypertension Father    Diabetes Father    Diabetes Sister    Diabetes Brother    Heart disease Neg Hx    Hyperlipidemia Neg Hx    Colon cancer Neg Hx    Esophageal cancer Neg Hx    Rectal cancer Neg Hx    Stomach cancer Neg Hx     Past Surgical History:  Procedure Laterality Date   ABDOMINAL HYSTERECTOMY     fribroids   BREAST LUMPECTOMY     benign   COLONOSCOPY  09/2010; 2007   tubular adenoma, Dr. Verl Blalock   POLYPECTOMY     TOTAL HIP ARTHROPLASTY  Right 07/14/2021   Procedure: RIGHT TOTAL HIP ARTHROPLASTY ANTERIOR APPROACH;  Surgeon: Leandrew Koyanagi, MD;  Location: [facility];  Service: Orthopedics;  Laterality: Right;  3-C   Social History   Occupational History   Not on file  Tobacco Use   Smoking status: Former    Types: Cigarettes    Quit date: 1994    Years since quitting: 29.3   Smokeless tobacco: Never  Vaping Use   Vaping Use: Never used  Substance and Sexual Activity   Alcohol use: No    Alcohol/week: 0.0 standard drinks   Drug use: No   Sexual activity: Not Currently

## 2021-09-30 ENCOUNTER — Ambulatory Visit (INDEPENDENT_AMBULATORY_CARE_PROVIDER_SITE_OTHER): Payer: Medicare Other | Admitting: Physician Assistant

## 2021-09-30 ENCOUNTER — Encounter: Payer: Self-pay | Admitting: Orthopaedic Surgery

## 2021-09-30 DIAGNOSIS — Z96641 Presence of right artificial hip joint: Secondary | ICD-10-CM

## 2021-09-30 NOTE — Progress Notes (Signed)
Post-Op Visit Note   Patient: Lisa Hutchinson           Date of Birth: Feb 13, 1951           MRN: [id number] Visit Date: 09/30/2021 PCP: Denita Lung, MD   Assessment & Plan:  Chief Complaint:  Chief Complaint  Patient presents with   Right Hip - Follow-up    Right total hip arthroplasty 07/14/2021   Visit Diagnoses:  1. History of total replacement of right hip     Plan: Patient is a pleasant 71 year old female who comes in today approximately 2-1/2 months status post right total hip replacement 07/14/2021.  She is doing well.  No complaints.  Examination right hip reveals full hip flexion.  Painless logroll.  She is neurovascular tact distally.  At this point, she may start to advance with activity as tolerated.  Dental prophylaxis reinforced.  Follow-up with [place] in about 3 and half months when she is 6 months out from surgery for AP pelvis x-rays.  Call with concerns or questions.  Follow-Up Instructions: Return in about 3 months (around 12/31/2021).   Orders:  No orders of the defined types were placed in this encounter.  No orders of the defined types were placed in this encounter.   Imaging: No new imaging  PMFS History: Patient Active Problem List   Diagnosis Date Noted   Status post total hip replacement, right 07/14/2021   Primary osteoarthritis of right hip 07/13/2021   Chronic hip pain 05/16/2021   Urinary frequency 05/16/2021   Glaucoma 12/03/2020   ACE-inhibitor cough 11/30/2019   Osteopenia 12/20/2017   Arthritis 07/15/2015   Obesity (BMI 30-39.9) 07/15/2015   Atrophic vaginitis 08/10/2013   Hypertension 05/27/2011   Family history of colonic polyps 10/22/2010   Hx of adenomatous colonic polyps 10/22/2010   Past Medical History:  Diagnosis Date   Anemia    with pregnancy    Arthritis    knees   Atrophic vaginitis    Cataract    bilateral   Edema    H/O mammogram 05/03/12   Hyperlipidemia    no medicines    Hypertension    Hypokalemia     Sleep difficulties    sleep study normal, fall 2013    Family History  Problem Relation Age of Onset   Colon polyps Mother 33       adenomatous polyps,ascending mass   Hypertension Mother    Diabetes Mother    Hypertension Father    Diabetes Father    Diabetes Sister    Diabetes Brother    Heart disease Neg Hx    Hyperlipidemia Neg Hx    Colon cancer Neg Hx    Esophageal cancer Neg Hx    Rectal cancer Neg Hx    Stomach cancer Neg Hx     Past Surgical History:  Procedure Laterality Date   ABDOMINAL HYSTERECTOMY     fribroids   BREAST LUMPECTOMY     benign   COLONOSCOPY  09/2010; 2007   tubular adenoma, Dr. Verl Blalock   POLYPECTOMY     TOTAL HIP ARTHROPLASTY Right 07/14/2021   Procedure: RIGHT TOTAL HIP ARTHROPLASTY ANTERIOR APPROACH;  Surgeon: Leandrew Koyanagi, MD;  Location: [facility];  Service: Orthopedics;  Laterality: Right;  3-C   Social History   Occupational History   Not on file  Tobacco Use   Smoking status: Former    Types: Cigarettes    Quit date: 1994    Years  since quitting: 29.4   Smokeless tobacco: Never  Vaping Use   Vaping Use: Never used  Substance and Sexual Activity   Alcohol use: No    Alcohol/week: 0.0 standard drinks   Drug use: No   Sexual activity: Not Currently

## 2021-10-06 ENCOUNTER — Ambulatory Visit
Admission: RE | Admit: 2021-10-06 | Discharge: 2021-10-06 | Disposition: A | Discharge status: Home / Self Care | Payer: Medicare Other | Source: Ambulatory Visit | Attending: Family Medicine | Admitting: Family Medicine

## 2021-10-06 DIAGNOSIS — Z1231 Encounter for screening mammogram for malignant neoplasm of breast: Secondary | ICD-10-CM | POA: Diagnosis not present

## 2021-10-21 ENCOUNTER — Telehealth: Payer: Self-pay | Admitting: *Deleted

## 2021-10-24 DIAGNOSIS — H401133 Primary open-angle glaucoma, bilateral, severe stage: Secondary | ICD-10-CM | POA: Diagnosis not present

## 2021-10-27 ENCOUNTER — Other Ambulatory Visit: Payer: Self-pay | Admitting: Physician Assistant

## 2021-10-27 ENCOUNTER — Telehealth: Payer: Self-pay | Admitting: Orthopaedic Surgery

## 2021-10-27 MED ORDER — CLINDAMYCIN HCL 300 MG PO CAPS: ORAL_CAPSULE | ORAL | 2 refills | Status: DC

## 2021-10-27 NOTE — Telephone Encounter (Signed)
Patient called. She has a dental procedure on 7/13, would like antibiotic called in. Her call back number is [phone]

## 2021-10-27 NOTE — Telephone Encounter (Signed)
Called and notified patient.

## 2021-10-27 NOTE — Telephone Encounter (Signed)
sent 

## 2021-12-04 ENCOUNTER — Encounter: Payer: Self-pay | Admitting: Family Medicine

## 2021-12-04 ENCOUNTER — Ambulatory Visit (INDEPENDENT_AMBULATORY_CARE_PROVIDER_SITE_OTHER): Payer: Medicare Other | Admitting: Family Medicine

## 2021-12-04 VITALS — BP 138/82 | HR 75 | Temp 97.2°F | Ht 63.0 in | Wt 174.4 lb

## 2021-12-04 DIAGNOSIS — E7439 Other disorders of intestinal carbohydrate absorption: Secondary | ICD-10-CM | POA: Diagnosis not present

## 2021-12-04 DIAGNOSIS — M25512 Pain in left shoulder: Secondary | ICD-10-CM

## 2021-12-04 DIAGNOSIS — R739 Hyperglycemia, unspecified: Secondary | ICD-10-CM | POA: Diagnosis not present

## 2021-12-04 DIAGNOSIS — I10 Essential (primary) hypertension: Secondary | ICD-10-CM

## 2021-12-04 DIAGNOSIS — M8580 Other specified disorders of bone density and structure, unspecified site: Secondary | ICD-10-CM

## 2021-12-04 DIAGNOSIS — M1990 Unspecified osteoarthritis, unspecified site: Secondary | ICD-10-CM | POA: Diagnosis not present

## 2021-12-04 DIAGNOSIS — Z96641 Presence of right artificial hip joint: Secondary | ICD-10-CM

## 2021-12-04 DIAGNOSIS — M1611 Unilateral primary osteoarthritis, right hip: Secondary | ICD-10-CM

## 2021-12-04 DIAGNOSIS — Z86010 Personal history of colonic polyps: Secondary | ICD-10-CM

## 2021-12-04 DIAGNOSIS — Z860101 Personal history of adenomatous and serrated colon polyps: Secondary | ICD-10-CM

## 2021-12-04 DIAGNOSIS — H409 Unspecified glaucoma: Secondary | ICD-10-CM

## 2021-12-04 LAB — POCT GLYCOSYLATED HEMOGLOBIN (HGB A1C): Hemoglobin A1C: 5.8 % — AB (ref 4.0–5.6)

## 2021-12-04 MED ORDER — LOSARTAN POTASSIUM-HCTZ 50-12.5 MG PO TABS: 1.0000 | ORAL_TABLET | Freq: Every day | ORAL | 3 refills | Status: DC

## 2021-12-04 NOTE — Progress Notes (Signed)
Lisa Hutchinson is a 71 y.o. female who presents for annual wellness visit and follow-up on chronic medical conditions.  She has no particular concerns or complaints.  She continues on her blood pressure medication.  She did have a DEXA scan done by her gynecologist and apparently it was normal.  She also had a vitamin D level which was apparently normal.  Colonoscopy was done in 2020 which was normal.  She is on a regular 5-year schedule.  She has had right hip replacement is doing quite nicely from that.  She also complains of some left shoulder pain that has been bothering her since she had to use a walker while she is getting her hip rehab done.  She is also quite happy that she lost 25 pounds.  She follows up regularly with ophthalmology.  Immunizations and Health Maintenance Immunization History  Administered Date(s) Administered   Fluad Quad(high Dose 65+) 05/23/2019, 01/25/2020, 01/27/2021   Influenza, High Dose Seasonal PF 06/22/2018   Influenza,inj,Quad PF,6+ Mos 02/21/2013, 01/24/2014   PFIZER(Purple Top)SARS-COV-2 Vaccination 07/01/2019, 07/29/2019, 02/05/2020   Pfizer Covid-19 Vaccine Bivalent Booster 22yr & up 01/27/2021   Pneumococcal Conjugate-13 12/20/2017   Pneumococcal Polysaccharide-23 05/23/2019   Tdap 07/18/2012   Zoster Recombinat (Shingrix) 07/15/2016, 09/28/2016   Zoster, Live 09/23/2012   Health Maintenance Due  Topic Date Due   COVID-19 Vaccine (5 - Pfizer risk series) 03/24/2021   INFLUENZA VACCINE  11/25/2021    Last Pap smear: aged out  Last mammogram: 10/06/2021 Last colonoscopy: 11/16/2018 Dr. PHilarie FredricksonLast DEXA:07/02/21 Dr. DCharlesetta Garibaldi Dentist: Q year Ophtho: Q three months Exercise: staying active walking QD  Other doctors caring for patient include: Dr. PHilarie FredricksonGI           Dr. DCharlesetta Garibaldigyn           Dr. XErlinda Hongortho         Advanced directives: Does Patient Have a Medical Advance Directive?: No Would patient like information on creating a medical advance  directive?: No - Patient declined  Depression screen:  See questionnaire below.     12/04/2021    8:47 AM 12/03/2020    1:50 PM 11/30/2019    8:18 AM 12/20/2017    8:28 AM 07/15/2016    1:43 PM  Depression screen PHQ 2/9  Decreased Interest 0 0 0 0 0  Down, Depressed, Hopeless 0 0 0 0 0  PHQ - 2 Score 0 0 0 0 0    Fall Risk Screen: see questionnaire below.    12/04/2021    8:45 AM 12/03/2020    1:50 PM 11/30/2019    8:17 AM 12/20/2017    8:28 AM 07/15/2016    1:43 PM  FN[place]lein the past year? 0 0 0 Yes No  Number falls in past yr: 0 0  1   Injury with Fall? 0 0  Yes   Risk for fall due to : No Fall Risks No Fall Risks No Fall Risks    Follow up Falls evaluation completed Falls evaluation completed       ADL screen:  See questionnaire below Functional Status Survey: Is the patient deaf or have difficulty hearing?: No Does the patient have difficulty seeing, even when wearing glasses/contacts?: No Does the patient have difficulty concentrating, remembering, or making decisions?: No Does the patient have difficulty walking or climbing stairs?: No Does the patient have difficulty dressing or bathing?: No Does the patient have difficulty doing errands alone such  as visiting a doctor's office or shopping?: No   Review of Systems Constitutional: -, -unexpected weight change, -anorexia, -fatigue Allergy: -sneezing, -itching, -congestion Dermatology: denies changing moles, rash, lumps ENT: -runny nose, -ear pain, -sore throat,  Cardiology:  -chest pain, -palpitations, -orthopnea, Respiratory: -cough, -shortness of breath, -dyspnea on exertion, -wheezing,  Gastroenterology: -abdominal pain, -nausea, -vomiting, -diarrhea, -constipation, -dysphagia Hematology: -bleeding or bruising problems Musculoskeletal: -arthralgias, -myalgias, -joint swelling, -back pain, - Ophthalmology: -vision changes,  Urology: -dysuria, -difficulty urinating,  -urinary frequency, -urgency,  incontinence Neurology: -, -numbness, , -memory loss, -falls, -dizziness    PHYSICAL EXAM:  BP 138/82   Pulse 75   Temp (!) 97.2 F (36.2 C)   Ht '5\' 3"'$  (1.6 m)   Wt 174 lb 6.4 oz (79.1 kg)   SpO2 97%   BMI 30.89 kg/m   General Appearance: Alert, cooperative, no distress, appears stated age Head: Normocephalic, without obvious abnormality, atraumatic Eyes: PERRL, conjunctiva/corneas clear, EOM's intact, Ears: Normal TM's and external ear canals Nose: Nares normal, mucosa normal, no drainage or sinus tenderness Throat: Lips, mucosa, and tongue normal; teeth and gums normal Neck: Supple, no lymphadenopathy;  thyroid:  no enlargement/tenderness/nodules; no carotid bruit or JVD Lungs: Clear to auscultation bilaterally without wheezes, rales or ronchi; respirations unlabored Heart: Regular rate and rhythm, S1 and S2 normal, no murmur, rubor gallop Abdomen: Soft, non-tender, nondistended, normoactive bowel sounds,  no masses, no hepatosplenomegaly Extremities: No clubbing, cyanosis or edema left arm exam shows full motion with no crepitus.  Negative sulcus sign.  Neer's and Hawkins test negative. Pulses: 2+ and symmetric all extremities Skin:  Skin color, texture, turgor normal, no rashes or lesions Lymph nodes: Cervical, supraclavicular, and axillary nodes normal Neurologic:  CNII-XII intact, normal strength, sensation and gait; reflexes 2+ and symmetric throughout Psych: Normal mood, affect, hygiene and grooming.  ASSESSMENT/PLAN: Primary hypertension  Arthritis  Osteopenia, unspecified location  Primary osteoarthritis of right hip  Glaucoma, unspecified glaucoma type, unspecified laterality  Hx of adenomatous colonic polyps  Status post total hip replacement, right  Acute pain of left shoulder - Plan: Ambulatory referral to Physical Therapy  Elevated blood sugar - Plan: POCT glycosylated hemoglobin (Hb A1C)  Essential hypertension - Plan: losartan-hydrochlorothiazide  (HYZAAR) 50-12.5 MG tablet  The 10-year ASCVD risk score (Arnett DK, et al., 2019) is: 13.6%   Values used to calculate the score:     Age: 47 years     Sex: Female     Is Non-Hispanic African American: Yes     Diabetic: No     Tobacco smoker: No     Systolic Blood Pressure: 419 mmHg     Is BP treated: Yes     HDL Cholesterol: 53 mg/dL     Total Cholesterol: 209 mg/dL   Discussed  yearly mammograms; at least 30 minutes of aerobic activity at least 5 days/week and weight-bearing exercise 2x/week; proper sunscreen use reviewed; healthy diet, including goals of calcium and vitamin D intake and alcohol recommendations (less than or equal to 1 drink/day) reviewed; Immunization recommendations discussed.  Colonoscopy recommendations reviewed.  Discussed her risk of heart disease and at this time she is not interested in pursuing a statin drug.  Will also send her to physical therapy to help with her shoulder pain.  Think this is more of an overuse type injury.   Medicare Attestation I have personally reviewed: The patient's medical and social history Their use of alcohol, tobacco or illicit drugs Their current medications and supplements The patient's  functional ability including ADLs,fall risks, home safety risks, cognitive, and hearing and visual impairment Diet and physical activities Evidence for depression or mood disorders  The patient's weight, height, and BMI have been recorded in the chart.  I have made referrals, counseling, and provided education to the patient based on review of the above and I have provided the patient with a written personalized care plan for preventive services.     Jill Alexanders, MD   12/04/2021

## 2021-12-04 NOTE — Patient Instructions (Signed)

## 2021-12-08 NOTE — Therapy (Unsigned)
OUTPATIENT PHYSICAL THERAPY SHOULDER EVALUATION   Patient Name: Lisa Hutchinson MRN: [id number] DOB:1951-04-17, 71 y.o., female Today's Date: 12/15/2021   PT End of Session - 12/15/21 1141     Visit Number 1    Number of Visits 7    Authorization Type MEDICARE PART A AND B    PT Start Time 1015    PT Stop Time 1059    PT Time Calculation (min) 44 min    Activity Tolerance Patient tolerated treatment well    Behavior During Therapy [facility] for tasks assessed/performed             Past Medical History:  Diagnosis Date   Anemia    with pregnancy    Arthritis    knees   Atrophic vaginitis    Cataract    bilateral   Edema    H/O mammogram 05/03/12   Hyperlipidemia    no medicines    Hypertension    Hypokalemia    Sleep difficulties    sleep study normal, fall 2013   Past Surgical History:  Procedure Laterality Date   ABDOMINAL HYSTERECTOMY     fribroids   BREAST LUMPECTOMY     benign   COLONOSCOPY  09/2010; 2007   tubular adenoma, Dr. Verl Blalock   POLYPECTOMY     TOTAL HIP ARTHROPLASTY Right 07/14/2021   Procedure: RIGHT TOTAL HIP ARTHROPLASTY ANTERIOR APPROACH;  Surgeon: Leandrew Koyanagi, MD;  Location: [place];  Service: Orthopedics;  Laterality: Right;  3-C   Patient Active Problem List   Diagnosis Date Noted   Status post total hip replacement, right 07/14/2021   Primary osteoarthritis of right hip 07/13/2021   Urinary frequency 05/16/2021   Glaucoma 12/03/2020   ACE-inhibitor cough 11/30/2019   Osteopenia 12/20/2017   Arthritis 07/15/2015   Obesity (BMI 30-39.9) 07/15/2015   Atrophic vaginitis 08/10/2013   Hypertension 05/27/2011   Family history of colonic polyps 10/22/2010   Hx of adenomatous colonic polyps 10/22/2010    PCP: Denita Lung, MD  REFERRING PROVIDER: Denita Lung, MD  REFERRING DIAG: Acute pain of left shoulder [M25.512]  THERAPY DIAG:  Chronic left shoulder pain  Muscle weakness (generalized)  Abnormal  posture  Rationale for Evaluation and Treatment Rehabilitation  ONSET DATE: May/ June  SUBJECTIVE:                                                                                                                                                                                      SUBJECTIVE STATEMENT: Pt reports to PT with an insidious onset of L shoulder pain. She states that it started after her THA in May/ June  when utilizing her walker/ SPC.  Pain has gotten better since she stopped using her AD's. She has been actively using 2lb weights to maintain her UE strength. Pt unable to sleep on her L side due to pain. She demonstrates full functional ROM.   PERTINENT HISTORY: Aneima, Arthritis, HTN, hyperlipidemia, Hypokalemia.   PAIN:  Are you having pain? Yes: NPRS scale: 3/10 Pain location: L shoulder in biceps. Pt reports intermittent stretching.  Pain description: Achy pain, Intermittent.  Aggravating factors: Nothing in particular brings on pain. Pt reports sleeping on it is painful.  Relieving factors: Tylenol.   PRECAUTIONS: None  WEIGHT BEARING RESTRICTIONS No  FALLS:  Has patient fallen in last 6 months? No  LIVING ENVIRONMENT: Lives with: lives alone Lives in: House/apartment Stairs: Yes: Internal: 1 steps; bilateral but cannot reach both and External: 2 steps; none Has following equipment at home: None  OCCUPATION: Retired  PLOF: Independent  PATIENT GOALS Pt would like to get rid of her shoulder pain.   OBJECTIVE:   DIAGNOSTIC FINDINGS:  None  PATIENT SURVEYS:  FOTO 74% eval, Predicted 75% in 8 visits.  COGNITION:  Overall cognitive status: Within functional limits for tasks assessed     SENSATION: WFL  POSTURE: Rounded shoulders, forward head.   UPPER EXTREMITY ROM:    Globally WFL.   UPPER EXTREMITY MMT:  MMT Right eval Left eval  Shoulder flexion 4+ 4  Shoulder extension    Shoulder abduction 4+ 4  Shoulder adduction    Shoulder  internal rotation 4+ 4+  Shoulder external rotation 4+ 4+  Middle trapezius    Lower trapezius    Grip strength (lbs)    (Blank rows = not tested)  SHOULDER SPECIAL TESTS:  Instability tests: Load and shift test: negative  Rotator cuff assessment: Drop arm test: negative and Empty can test: positive   Biceps assessment: Speed's test: negative  JOINT MOBILITY TESTING:  Normal.   PALPATION:  No tenderness with palpation.    TODAY'S TREATMENT:  Creating, reviewing, and completing below HEP   PATIENT EDUCATION: Education details: Educated pt on anatomy and physiology of current symptoms, FOTO diagnosis, prognosis, HEP,  and POC. Person educated: Patient Education method: Customer service manager Education comprehension: verbalized understanding and returned demonstration   HOME EXERCISE PROGRAM: Access Code: [email or web address] URL: [email or web address] Date: 12/15/2021 Prepared by: Rudi Heap  Exercises - Seated Scapular Retraction  - 2 x daily - 7 x weekly - 2 sets - 10 reps - 5 hold - Seated Upper Trapezius Stretch  - 2 x daily - 7 x weekly - 2 sets - 2 reps - 30 hold  ASSESSMENT:  CLINICAL IMPRESSION: Patient is a 71 y.o. F who was seen today for physical therapy evaluation and treatment for L shoulder pain. Pt demonstrates full functional ROM with slight strength deficits noted on L UE compare to R. Pt unable to recreate familiar pain in eval. Educated pt on alternative ways of sleeping to help minimize pain in L shoulder. Pt will benefit from skilled PT to address deficits.   OBJECTIVE IMPAIRMENTS decreased activity tolerance, decreased knowledge of condition, decreased strength, impaired UE functional use, and pain.   ACTIVITY LIMITATIONS sleeping and bathing  PARTICIPATION LIMITATIONS: yard work  PERSONAL FACTORS 3+ comorbidities: Aneima, Arthritis, HTN, hyperlipidemia, Hypokalemia.   are also affecting patient's functional outcome.   REHAB POTENTIAL:  Excellent  CLINICAL DECISION MAKING: Stable/uncomplicated  EVALUATION COMPLEXITY: Low   GOALS: Goals reviewed with patient? No  LONG TERM GOALS: Target date:  01/26/2022  (Remove Blue Hyperlink)  Pt will be independent with advanced HEP to continue to address postural limitations and muscle imbalances. Baseline: Provided at eval.  Goal status: INITIAL  2.  Pt will improve FOTO function score to no less than 75% as proxy for functional improvement Baseline: 74% at initial  Goal status: INITIAL  3.  Pt will report </= 2/10 pain at baseline  Baseline: 3/10 Goal status: INITIAL  4.  Pt will demonstrate 4+ strength with L shoulder flexion/ Abduction Baseline: 4/5 Goal status: INITIAL  5.  Pt will report no increase in L shoulder pain upon sleeping on it.  Baseline: 3/10 Goal status: INITIAL  PLAN: PT FREQUENCY: 1x/week  PT DURATION: 6 weeks  PLANNED INTERVENTIONS: Therapeutic exercises, Therapeutic activity, Neuromuscular re-education, Patient/Family education, Self Care, Joint mobilization, Joint manipulation, Dry Needling, Electrical stimulation, Spinal manipulation, Spinal mobilization, Cryotherapy, Moist heat, Taping, Vasopneumatic device, Traction, Ultrasound, Ionotophoresis '4mg'$ /ml Dexamethasone, Manual therapy, and Re-evaluation  PLAN FOR NEXT SESSION: Assess HEP/update PRN, continue to progress functional mobility, strengthen parascapular muscles. Decrease patients pain and help minimize pain with sleeping.     Lynden Ang, PT 12/15/2021, 11:58 AM

## 2021-12-15 ENCOUNTER — Ambulatory Visit (INDEPENDENT_AMBULATORY_CARE_PROVIDER_SITE_OTHER): Payer: Medicare Other | Admitting: Physical Therapy

## 2021-12-15 ENCOUNTER — Encounter: Payer: Self-pay | Admitting: Physical Therapy

## 2021-12-15 ENCOUNTER — Other Ambulatory Visit: Payer: Self-pay

## 2021-12-15 DIAGNOSIS — M6281 Muscle weakness (generalized): Secondary | ICD-10-CM

## 2021-12-15 DIAGNOSIS — R293 Abnormal posture: Secondary | ICD-10-CM | POA: Diagnosis not present

## 2021-12-15 DIAGNOSIS — M25512 Pain in left shoulder: Secondary | ICD-10-CM | POA: Diagnosis not present

## 2021-12-15 DIAGNOSIS — G8929 Other chronic pain: Secondary | ICD-10-CM

## 2021-12-15 NOTE — Addendum Note (Signed)
Addended by: Lynden Ang on: 12/15/2021 02:17 PM   Modules accepted: Orders

## 2021-12-26 ENCOUNTER — Encounter: Payer: Self-pay | Admitting: Family Medicine

## 2021-12-31 ENCOUNTER — Encounter: Payer: Self-pay | Admitting: Physical Therapy

## 2021-12-31 ENCOUNTER — Ambulatory Visit (INDEPENDENT_AMBULATORY_CARE_PROVIDER_SITE_OTHER): Payer: Medicare Other | Admitting: Physical Therapy

## 2021-12-31 ENCOUNTER — Encounter: Payer: Self-pay | Admitting: Internal Medicine

## 2021-12-31 DIAGNOSIS — G8929 Other chronic pain: Secondary | ICD-10-CM

## 2021-12-31 DIAGNOSIS — M25512 Pain in left shoulder: Secondary | ICD-10-CM

## 2021-12-31 DIAGNOSIS — R293 Abnormal posture: Secondary | ICD-10-CM | POA: Diagnosis not present

## 2021-12-31 DIAGNOSIS — M6281 Muscle weakness (generalized): Secondary | ICD-10-CM | POA: Diagnosis not present

## 2021-12-31 NOTE — Therapy (Signed)
OUTPATIENT PHYSICAL THERAPY TREATMENT NOTE   Patient Name: Lisa Hutchinson MRN: [id number] DOB:03-04-1951, 71 y.o., female Today's Date: 12/31/2021  END OF SESSION:   PT End of Session - 12/31/21 1311     Visit Number 2    Number of Visits 7    Authorization Type MEDICARE PART A AND B    PT Start Time 1300    PT Stop Time 1340    PT Time Calculation (min) 40 min    Activity Tolerance Patient tolerated treatment well    Behavior During Therapy WFL for tasks assessed/performed             Past Medical History:  Diagnosis Date   Anemia    with pregnancy    Arthritis    knees   Atrophic vaginitis    Cataract    bilateral   Edema    H/O mammogram 05/03/12   Hyperlipidemia    no medicines    Hypertension    Hypokalemia    Sleep difficulties    sleep study normal, fall 2013   Past Surgical History:  Procedure Laterality Date   ABDOMINAL HYSTERECTOMY     fribroids   BREAST LUMPECTOMY     benign   COLONOSCOPY  09/2010; 2007   tubular adenoma, Dr. Verl Blalock   POLYPECTOMY     TOTAL HIP ARTHROPLASTY Right 07/14/2021   Procedure: RIGHT TOTAL HIP ARTHROPLASTY ANTERIOR APPROACH;  Surgeon: Leandrew Koyanagi, MD;  Location: [place];  Service: Orthopedics;  Laterality: Right;  3-C   Patient Active Problem List   Diagnosis Date Noted   Status post total hip replacement, right 07/14/2021   Primary osteoarthritis of right hip 07/13/2021   Urinary frequency 05/16/2021   Glaucoma 12/03/2020   ACE-inhibitor cough 11/30/2019   Osteopenia 12/20/2017   Arthritis 07/15/2015   Obesity (BMI 30-39.9) 07/15/2015   Atrophic vaginitis 08/10/2013   Hypertension 05/27/2011   Family history of colonic polyps 10/22/2010   Hx of adenomatous colonic polyps 10/22/2010     THERAPY DIAG:  Chronic left shoulder pain  Muscle weakness (generalized)  Abnormal posture  PCP: Denita Lung, MD   REFERRING PROVIDER: Denita Lung, MD   REFERRING DIAG: Acute pain of left shoulder  [M25.512]   Rationale for Evaluation and Treatment Rehabilitation   ONSET DATE: May/ June   SUBJECTIVE:                                                                                                                                                                                       SUBJECTIVE STATEMENT: She says the pain is intermittent in left shoulder.   PERTINENT  HISTORY: Aneima, Arthritis, HTN, hyperlipidemia, Hypokalemia.    PAIN:  Are you having pain? Yes: NPRS scale: 3/10 Pain location: L shoulder in biceps. Pt reports intermittent stretching.  Pain description: Achy pain, Intermittent.  Aggravating factors: Nothing in particular brings on pain. Pt reports sleeping on it is painful.  Relieving factors: Tylenol.    PRECAUTIONS: None   WEIGHT BEARING RESTRICTIONS No   FALLS:  Has patient fallen in last 6 months? No   LIVING ENVIRONMENT: Lives with: lives alone Lives in: House/apartment Stairs: Yes: Internal: 1 steps; bilateral but cannot reach both and External: 2 steps; none Has following equipment at home: None   OCCUPATION: Retired   PLOF: Independent   PATIENT GOALS Pt would like to get rid of her shoulder pain.    OBJECTIVE:    DIAGNOSTIC FINDINGS:  None   PATIENT SURVEYS:  FOTO 74% eval, Predicted 75% in 8 visits.   COGNITION:           Overall cognitive status: Within functional limits for tasks assessed                                  SENSATION: WFL   POSTURE: Rounded shoulders, forward head.    UPPER EXTREMITY ROM:                        Globally WFL.    UPPER EXTREMITY MMT:   MMT Right eval Left eval  Shoulder flexion 4+ 4  Shoulder extension      Shoulder abduction 4+ 4  Shoulder adduction      Shoulder internal rotation 4+ 4+  Shoulder external rotation 4+ 4+  Middle trapezius      Lower trapezius      Grip strength (lbs)      (Blank rows = not tested)   SHOULDER SPECIAL TESTS:            Instability tests: Load and  shift test: negative            Rotator cuff assessment: Drop arm test: negative and Empty can test: positive             Biceps assessment: Speed's test: negative   JOINT MOBILITY TESTING:  Normal.    PALPATION:  No tenderness with palpation.              TODAY'S TREATMENT:  12/31/21 -Pulleys 2 min flexion, 2 min scaption -Shoulder rows with red X20 -Shoulder extensions with red X20 -Shoulder ER bilat with red X 15 -Shoulder Horizontal abd bilat with red X15 -Shoulder chest press into flexion AAROM X15 -Shoulder abd AAROM X 15 -Shoulder ER AAROM X15 -Shoulder IR/ER AROM stretch holding 5 sec X 10 each.  -Moist heat to left shoulder X 8 min with HEP progressions printed and reviewed with her.     PATIENT EDUCATION: Education details: Educated pt on anatomy and physiology of current symptoms, FOTO diagnosis, prognosis, HEP,  and POC. Person educated: Patient Education method: Customer service manager Education comprehension: verbalized understanding and returned demonstration     HOME EXERCISE PROGRAM: Access Code: [email or web address] URL: [email or web address] Date: 12/31/2021 Prepared by: Elsie Ra  Exercises - Seated Scapular Retraction  - 2 x daily - 6 x weekly - 2 sets - 10 reps - 5 hold - Seated Upper Trapezius Stretch  - 2 x daily - 6 x weekly - 2 sets - 2 reps -  30 hold - Standing Shoulder Row with Anchored Resistance  - 2 x daily - 6 x weekly - 2 sets - 10 reps - Shoulder extension with resistance - Neutral  - 2 x daily - 6 x weekly - 2 sets - 10 reps - Shoulder External Rotation and Scapular Retraction with Resistance  - 2 x daily - 6 x weekly - 2 sets - 10 reps - Supine Shoulder Flexion with Dowel  - 1 x daily - 3 x weekly - 1 sets - 15 reps - Supine Shoulder External Rotation in 45 Degrees Abduction AAROM with Dowel  - 2 x daily - 6 x weekly - 1 sets - 15 reps - Supine Shoulder Abduction AAROM with Dowel  - 2 x daily - 6 x weekly - 1 sets - 15 reps    ASSESSMENT:   CLINICAL IMPRESSION: I progressed her HEP some and this was performed today and Reviewed. She showed good understanding. She had good tolerance to program today. PT recommending to continue current POC.   OBJECTIVE IMPAIRMENTS decreased activity tolerance, decreased knowledge of condition, decreased strength, impaired UE functional use, and pain.    ACTIVITY LIMITATIONS sleeping and bathing   PARTICIPATION LIMITATIONS: yard work   PERSONAL FACTORS 3+ comorbidities: Aneima, Arthritis, HTN, hyperlipidemia, Hypokalemia.   are also affecting patient's functional outcome.    REHAB POTENTIAL: Excellent   CLINICAL DECISION MAKING: Stable/uncomplicated   EVALUATION COMPLEXITY: Low     GOALS: Goals reviewed with patient? No   LONG TERM GOALS: Target date: 01/26/2022  (Remove Blue Hyperlink)   Pt will be independent with advanced HEP to continue to address postural limitations and muscle imbalances. Baseline: Provided at eval.  Goal status: INITIAL   2.  Pt will improve FOTO function score to no less than 75% as proxy for functional improvement Baseline: 74% at initial  Goal status: INITIAL   3.  Pt will report </= 2/10 pain at baseline  Baseline: 3/10 Goal status: INITIAL   4.  Pt will demonstrate 4+ strength with L shoulder flexion/ Abduction Baseline: 4/5 Goal status: INITIAL   5.  Pt will report no increase in L shoulder pain upon sleeping on it.  Baseline: 3/10 Goal status: INITIAL   PLAN: PT FREQUENCY: 1x/week   PT DURATION: 6 weeks   PLANNED INTERVENTIONS: Therapeutic exercises, Therapeutic activity, Neuromuscular re-education, Patient/Family education, Self Care, Joint mobilization, Joint manipulation, Dry Needling, Electrical stimulation, Spinal manipulation, Spinal mobilization, Cryotherapy, Moist heat, Taping, Vasopneumatic device, Traction, Ultrasound, Ionotophoresis '4mg'$ /ml Dexamethasone, Manual therapy, and Re-evaluation   PLAN FOR NEXT SESSION  continue to progress functional mobility, strengthen parascapular muscles. Decrease patients pain and help minimize pain with sleeping.     Debbe Odea, PT,DPT 12/31/2021, 1:11 PM

## 2022-01-01 ENCOUNTER — Encounter: Payer: Self-pay | Admitting: Family Medicine

## 2022-01-01 DIAGNOSIS — I10 Essential (primary) hypertension: Secondary | ICD-10-CM | POA: Diagnosis not present

## 2022-01-01 DIAGNOSIS — H2513 Age-related nuclear cataract, bilateral: Secondary | ICD-10-CM | POA: Diagnosis not present

## 2022-01-01 DIAGNOSIS — H5213 Myopia, bilateral: Secondary | ICD-10-CM | POA: Diagnosis not present

## 2022-01-01 DIAGNOSIS — H401133 Primary open-angle glaucoma, bilateral, severe stage: Secondary | ICD-10-CM | POA: Diagnosis not present

## 2022-01-07 ENCOUNTER — Ambulatory Visit (INDEPENDENT_AMBULATORY_CARE_PROVIDER_SITE_OTHER): Payer: Medicare Other | Admitting: Physician Assistant

## 2022-01-07 ENCOUNTER — Encounter: Payer: Self-pay | Admitting: Orthopaedic Surgery

## 2022-01-07 ENCOUNTER — Encounter: Payer: Self-pay | Admitting: Physical Therapy

## 2022-01-07 ENCOUNTER — Ambulatory Visit (INDEPENDENT_AMBULATORY_CARE_PROVIDER_SITE_OTHER): Payer: Medicare Other

## 2022-01-07 ENCOUNTER — Ambulatory Visit (INDEPENDENT_AMBULATORY_CARE_PROVIDER_SITE_OTHER): Payer: Medicare Other | Admitting: Physical Therapy

## 2022-01-07 DIAGNOSIS — M25512 Pain in left shoulder: Secondary | ICD-10-CM | POA: Diagnosis not present

## 2022-01-07 DIAGNOSIS — G8929 Other chronic pain: Secondary | ICD-10-CM | POA: Diagnosis not present

## 2022-01-07 DIAGNOSIS — Z96641 Presence of right artificial hip joint: Secondary | ICD-10-CM | POA: Diagnosis not present

## 2022-01-07 DIAGNOSIS — M6281 Muscle weakness (generalized): Secondary | ICD-10-CM | POA: Diagnosis not present

## 2022-01-07 DIAGNOSIS — R293 Abnormal posture: Secondary | ICD-10-CM | POA: Diagnosis not present

## 2022-01-07 NOTE — Therapy (Signed)
OUTPATIENT PHYSICAL THERAPY TREATMENT NOTE   Patient Name: Lisa Hutchinson MRN: [id number] DOB:1951-03-31, 71 y.o., female Today's Date: 01/07/2022  END OF SESSION:   PT End of Session - 01/07/22 0903     Visit Number 3    Number of Visits 7    Date for PT Re-Evaluation 01/26/22    Authorization Type MEDICARE PART A AND B    PT Start Time 0845    PT Stop Time 0925    PT Time Calculation (min) 40 min    Activity Tolerance Patient tolerated treatment well    Behavior During Therapy [facility] for tasks assessed/performed             Past Medical History:  Diagnosis Date   Anemia    with pregnancy    Arthritis    knees   Atrophic vaginitis    Cataract    bilateral   Edema    H/O mammogram 05/03/12   Hyperlipidemia    no medicines    Hypertension    Hypokalemia    Sleep difficulties    sleep study normal, fall 2013   Past Surgical History:  Procedure Laterality Date   ABDOMINAL HYSTERECTOMY     fribroids   BREAST LUMPECTOMY     benign   COLONOSCOPY  09/2010; 2007   tubular adenoma, Dr. Verl Blalock   POLYPECTOMY     TOTAL HIP ARTHROPLASTY Right 07/14/2021   Procedure: RIGHT TOTAL HIP ARTHROPLASTY ANTERIOR APPROACH;  Surgeon: Leandrew Koyanagi, MD;  Location: [place];  Service: Orthopedics;  Laterality: Right;  3-C   Patient Active Problem List   Diagnosis Date Noted   Status post total hip replacement, right 07/14/2021   Primary osteoarthritis of right hip 07/13/2021   Urinary frequency 05/16/2021   Glaucoma 12/03/2020   ACE-inhibitor cough 11/30/2019   Osteopenia 12/20/2017   Arthritis 07/15/2015   Obesity (BMI 30-39.9) 07/15/2015   Atrophic vaginitis 08/10/2013   Hypertension 05/27/2011   Family history of colonic polyps 10/22/2010   Hx of adenomatous colonic polyps 10/22/2010     THERAPY DIAG:  Chronic left shoulder pain  Muscle weakness (generalized)  Abnormal posture  PCP: Denita Lung, MD   REFERRING PROVIDER: Denita Lung, MD   REFERRING  DIAG: Acute pain of left shoulder [M25.512]   Rationale for Evaluation and Treatment Rehabilitation   ONSET DATE: May/ June   SUBJECTIVE:                                                                                                                                                                                       SUBJECTIVE STATEMENT: She says the pain  is overall better in her shoulder but some difficulty sleeping on left side still.   PERTINENT HISTORY: Aneima, Arthritis, HTN, hyperlipidemia, Hypokalemia.    PAIN:  Are you having pain? Yes: NPRS scale: 2/10 Pain location: L shoulder in biceps. Pt reports intermittent stretching.  Pain description: Achy pain, Intermittent.  Aggravating factors: Nothing in particular brings on pain. Pt reports sleeping on it is painful.  Relieving factors: Tylenol.    PRECAUTIONS: None   WEIGHT BEARING RESTRICTIONS No   FALLS:  Has patient fallen in last 6 months? No   LIVING ENVIRONMENT: Lives with: lives alone Lives in: House/apartment Stairs: Yes: Internal: 1 steps; bilateral but cannot reach both and External: 2 steps; none Has following equipment at home: None   OCCUPATION: Retired   PLOF: Independent   PATIENT GOALS Pt would like to get rid of her shoulder pain.    OBJECTIVE:    DIAGNOSTIC FINDINGS:  None   PATIENT SURVEYS:  FOTO 74% eval, Predicted 75% in 8 visits.   COGNITION:           Overall cognitive status: Within functional limits for tasks assessed                                  SENSATION: WFL   POSTURE: Rounded shoulders, forward head.    UPPER EXTREMITY ROM:                        Globally WFL.    UPPER EXTREMITY MMT:   MMT Right eval Left eval Left 01/07/22  Shoulder flexion 4+ 4 4  Shoulder extension       Shoulder abduction 4+ 4 4  Shoulder adduction       Shoulder internal rotation 4+ 4+ 4+  Shoulder external rotation 4+ 4+ 4+  Middle trapezius       Lower trapezius       Grip  strength (lbs)       (Blank rows = not tested)   SHOULDER SPECIAL TESTS:            Instability tests: Load and shift test: negative            Rotator cuff assessment: Drop arm test: negative and Empty can test: positive             Biceps assessment: Speed's test: negative   JOINT MOBILITY TESTING:  Normal.    PALPATION:  No tenderness with palpation.              TODAY'S TREATMENT:  01/07/22 -UBE L2 2 min fwd, 2 min retro -Pulleys 2 min flexion, 2 min scaption -Shoulder bilat flexion 2# 2X10 -Shoulder bilat abd 2# 2X10 -Shoulder ER bilat with red 2X10 -Shoulder Horizontal abd bilat with red 2X10 -Shoulder flexion 2#bar AAROM X10 -Shoulder abd AAROM X 10 2# bar -Shoulder ER AAROM X10 2# bar -Shoulder IR/ER AROM stretch holding 5 sec X 10 each. -Chest press machine 5# 2X10 -Row machine 20# 2X10   -Moist heat to left shoulder X 8 min with HEP progressions printed and reviewed with her. 12/31/21 -Pulleys 2 min flexion, 2 min scaption -Shoulder rows with red X20 -Shoulder extensions with red X20 -Shoulder ER bilat with red X 15 -Shoulder Horizontal abd bilat with red X15 -Shoulder chest press into flexion AAROM X15 -Shoulder abd AAROM X 15 -Shoulder ER AAROM X15 -Shoulder IR/ER AROM stretch holding 5  sec X 10 each.  -Moist heat to left shoulder X 8 min with HEP progressions printed and reviewed with her.     PATIENT EDUCATION: Education details: Educated pt on anatomy and physiology of current symptoms, FOTO diagnosis, prognosis, HEP,  and POC. Person educated: Patient Education method: Customer service manager Education comprehension: verbalized understanding and returned demonstration     HOME EXERCISE PROGRAM: Access Code: [email or web address] URL: [email or web address] Date: 12/31/2021 Prepared by: Elsie Ra  Exercises - Seated Scapular Retraction  - 2 x daily - 6 x weekly - 2 sets - 10 reps - 5 hold - Seated Upper Trapezius Stretch  - 2 x daily -  6 x weekly - 2 sets - 2 reps - 30 hold - Standing Shoulder Row with Anchored Resistance  - 2 x daily - 6 x weekly - 2 sets - 10 reps - Shoulder extension with resistance - Neutral  - 2 x daily - 6 x weekly - 2 sets - 10 reps - Shoulder External Rotation and Scapular Retraction with Resistance  - 2 x daily - 6 x weekly - 2 sets - 10 reps - Supine Shoulder Flexion with Dowel  - 1 x daily - 3 x weekly - 1 sets - 15 reps - Supine Shoulder External Rotation in 45 Degrees Abduction AAROM with Dowel  - 2 x daily - 6 x weekly - 1 sets - 15 reps - Supine Shoulder Abduction AAROM with Dowel  - 2 x daily - 6 x weekly - 1 sets - 15 reps   ASSESSMENT:   CLINICAL IMPRESSION: She appears to be making progress with less overall shoulder pain. She does still have some weakness in flexion and abd in left shoulder compared to Rt so we will continue to work to improve this with PT.   OBJECTIVE IMPAIRMENTS decreased activity tolerance, decreased knowledge of condition, decreased strength, impaired UE functional use, and pain.    ACTIVITY LIMITATIONS sleeping and bathing   PARTICIPATION LIMITATIONS: yard work   PERSONAL FACTORS 3+ comorbidities: Aneima, Arthritis, HTN, hyperlipidemia, Hypokalemia.   are also affecting patient's functional outcome.    REHAB POTENTIAL: Excellent   CLINICAL DECISION MAKING: Stable/uncomplicated   EVALUATION COMPLEXITY: Low     GOALS:    LONG TERM GOALS: Target date: 01/26/2022     Pt will be independent with advanced HEP to continue to address postural limitations and muscle imbalances. Baseline: Provided at eval.  Goal status: ongoing   2.  Pt will improve FOTO function score to no less than 75% as proxy for functional improvement Baseline: 74% at initial  Goal status: ongoing   3.  Pt will report </= 2/10 pain at baseline  Baseline: 3/10 Goal status: ongoing   4.  Pt will demonstrate 4+ strength with L shoulder flexion/ Abduction Baseline: 4/5 Goal status:  ongoing   5.  Pt will report no increase in L shoulder pain upon sleeping on it.  Baseline: 3/10 Goal status: ongoing.   PLAN: PT FREQUENCY: 1x/week   PT DURATION: 6 weeks   PLANNED INTERVENTIONS: Therapeutic exercises, Therapeutic activity, Neuromuscular re-education, Patient/Family education, Self Care, Joint mobilization, Joint manipulation, Dry Needling, Electrical stimulation, Spinal manipulation, Spinal mobilization, Cryotherapy, Moist heat, Taping, Vasopneumatic device, Traction, Ultrasound, Ionotophoresis '4mg'$ /ml Dexamethasone, Manual therapy, and Re-evaluation   PLAN FOR NEXT SESSION what did MD say? continue to progress functional mobility, strengthen parascapular muscles. Decrease patients pain and help minimize pain with sleeping.     Debbe Odea, PT,DPT 01/07/2022, 9:04 AM

## 2022-01-07 NOTE — Addendum Note (Signed)
Addended by: Aundra Dubin on: 01/07/2022 10:48 AM   Modules accepted: Level of Service

## 2022-01-07 NOTE — Progress Notes (Signed)
Post-Op Visit Note   Patient: Lisa Hutchinson           Date of Birth: 1950/09/29           MRN: [id number] Visit Date: 01/07/2022 PCP: Denita Lung, MD   Assessment & Plan:  Chief Complaint:  Chief Complaint  Patient presents with   Right Hip - Follow-up    Right total hip arthroplasty 07/14/2021   Visit Diagnoses:  1. History of hip replacement, total, right     Plan: Patient is a pleasant 71 year old female who comes in today approximately 6 months status post right total hip replacement 07/14/2021.  She has been doing well.  No complaints.  Examination of the right hip reveals painless logroll and hip flexion.  She is neurovascular intact distally.  At this point, she will continue to advance with activity as tolerated.  Dental prophylaxis reinforced for another 1-1/2 years.  She will follow-up with [place] in 6 months for repeat evaluation and AP pelvis x-rays.  Call with concerns or questions.  Follow-Up Instructions: Return in about 6 months (around 07/08/2022).   Orders:  Orders Placed This Encounter  Procedures   XR Pelvis 1-2 Views   No orders of the defined types were placed in this encounter.   Imaging: XR Pelvis 1-2 Views  Result Date: 01/07/2022 Well-seated prosthesis without complication   PMFS History: Patient Active Problem List   Diagnosis Date Noted   Status post total hip replacement, right 07/14/2021   Primary osteoarthritis of right hip 07/13/2021   Urinary frequency 05/16/2021   Glaucoma 12/03/2020   ACE-inhibitor cough 11/30/2019   Osteopenia 12/20/2017   Arthritis 07/15/2015   Obesity (BMI 30-39.9) 07/15/2015   Atrophic vaginitis 08/10/2013   Hypertension 05/27/2011   Family history of colonic polyps 10/22/2010   Hx of adenomatous colonic polyps 10/22/2010   Past Medical History:  Diagnosis Date   Anemia    with pregnancy    Arthritis    knees   Atrophic vaginitis    Cataract    bilateral   Edema    H/O mammogram 05/03/12    Hyperlipidemia    no medicines    Hypertension    Hypokalemia    Sleep difficulties    sleep study normal, fall 2013    Family History  Problem Relation Age of Onset   Colon polyps Mother 55       adenomatous polyps,ascending mass   Hypertension Mother    Diabetes Mother    Hypertension Father    Diabetes Father    Diabetes Sister    Diabetes Brother    Heart disease Neg Hx    Hyperlipidemia Neg Hx    Colon cancer Neg Hx    Esophageal cancer Neg Hx    Rectal cancer Neg Hx    Stomach cancer Neg Hx     Past Surgical History:  Procedure Laterality Date   ABDOMINAL HYSTERECTOMY     fribroids   BREAST LUMPECTOMY     benign   COLONOSCOPY  09/2010; 2007   tubular adenoma, Dr. Verl Blalock   POLYPECTOMY     TOTAL HIP ARTHROPLASTY Right 07/14/2021   Procedure: RIGHT TOTAL HIP ARTHROPLASTY ANTERIOR APPROACH;  Surgeon: Leandrew Koyanagi, MD;  Location: [facility];  Service: Orthopedics;  Laterality: Right;  3-C   Social History   Occupational History   Not on file  Tobacco Use   Smoking status: Former    Types: Cigarettes    Quit date:  1994    Years since quitting: 29.7   Smokeless tobacco: Never  Vaping Use   Vaping Use: Never used  Substance and Sexual Activity   Alcohol use: No    Alcohol/week: 0.0 standard drinks of alcohol   Drug use: No   Sexual activity: Not Currently

## 2022-01-08 NOTE — Therapy (Unsigned)
OUTPATIENT PHYSICAL THERAPY TREATMENT NOTE   Patient Name: Lisa Hutchinson MRN: [id number] DOB:04/22/1951, 71 y.o., female Today's Date: 01/08/2022  END OF SESSION:     Past Medical History:  Diagnosis Date   Anemia    with pregnancy    Arthritis    knees   Atrophic vaginitis    Cataract    bilateral   Edema    H/O mammogram 05/03/12   Hyperlipidemia    no medicines    Hypertension    Hypokalemia    Sleep difficulties    sleep study normal, fall 2013   Past Surgical History:  Procedure Laterality Date   ABDOMINAL HYSTERECTOMY     fribroids   BREAST LUMPECTOMY     benign   COLONOSCOPY  09/2010; 2007   tubular adenoma, Dr. Verl Blalock   POLYPECTOMY     TOTAL HIP ARTHROPLASTY Right 07/14/2021   Procedure: RIGHT TOTAL HIP ARTHROPLASTY ANTERIOR APPROACH;  Surgeon: Leandrew Koyanagi, MD;  Location: [facility];  Service: Orthopedics;  Laterality: Right;  3-C   Patient Active Problem List   Diagnosis Date Noted   Status post total hip replacement, right 07/14/2021   Primary osteoarthritis of right hip 07/13/2021   Urinary frequency 05/16/2021   Glaucoma 12/03/2020   ACE-inhibitor cough 11/30/2019   Osteopenia 12/20/2017   Arthritis 07/15/2015   Obesity (BMI 30-39.9) 07/15/2015   Atrophic vaginitis 08/10/2013   Hypertension 05/27/2011   Family history of colonic polyps 10/22/2010   Hx of adenomatous colonic polyps 10/22/2010     THERAPY DIAG:  No diagnosis found.  PCP: Denita Lung, MD   REFERRING PROVIDER: Denita Lung, MD   REFERRING DIAG: Acute pain of left shoulder [M25.512]   Rationale for Evaluation and Treatment Rehabilitation   ONSET DATE: May/ June   SUBJECTIVE:                                                                                                                                                                                       SUBJECTIVE STATEMENT: She says the pain is overall better in her shoulder but some difficulty sleeping on  left side still.   PERTINENT HISTORY: Aneima, Arthritis, HTN, hyperlipidemia, Hypokalemia.    PAIN:  Are you having pain? Yes: NPRS scale: 2/10 Pain location: L shoulder in biceps. Pt reports intermittent stretching.  Pain description: Achy pain, Intermittent.  Aggravating factors: Nothing in particular brings on pain. Pt reports sleeping on it is painful.  Relieving factors: Tylenol.    PRECAUTIONS: None   WEIGHT BEARING RESTRICTIONS No   FALLS:  Has patient fallen in last 6 months? No   LIVING ENVIRONMENT:  Lives with: lives alone Lives in: House/apartment Stairs: Yes: Internal: 1 steps; bilateral but cannot reach both and External: 2 steps; none Has following equipment at home: None   OCCUPATION: Retired   PLOF: Independent   PATIENT GOALS Pt would like to get rid of her shoulder pain.    OBJECTIVE:    DIAGNOSTIC FINDINGS:  None   PATIENT SURVEYS:  FOTO 74% eval, Predicted 75% in 8 visits.   COGNITION:           Overall cognitive status: Within functional limits for tasks assessed                                  SENSATION: WFL   POSTURE: Rounded shoulders, forward head.    UPPER EXTREMITY ROM:                        Globally WFL.    UPPER EXTREMITY MMT:   MMT Right eval Left eval Left 01/07/22  Shoulder flexion 4+ 4 4  Shoulder extension       Shoulder abduction 4+ 4 4  Shoulder adduction       Shoulder internal rotation 4+ 4+ 4+  Shoulder external rotation 4+ 4+ 4+  Middle trapezius       Lower trapezius       Grip strength (lbs)       (Blank rows = not tested)   SHOULDER SPECIAL TESTS:            Instability tests: Load and shift test: negative            Rotator cuff assessment: Drop arm test: negative and Empty can test: positive             Biceps assessment: Speed's test: negative   JOINT MOBILITY TESTING:  Normal.    PALPATION:  No tenderness with palpation.              TODAY'S TREATMENT:  01/07/22 -UBE L2 2 min fwd, 2 min  retro -Pulleys 2 min flexion, 2 min scaption -Shoulder bilat flexion 2# 2X10 -Shoulder bilat abd 2# 2X10 -Shoulder ER bilat with red 2X10 -Shoulder Horizontal abd bilat with red 2X10 -Shoulder flexion 2#bar AAROM X10 -Shoulder abd AAROM X 10 2# bar -Shoulder ER AAROM X10 2# bar -Shoulder IR/ER AROM stretch holding 5 sec X 10 each. -Chest press machine 5# 2X10 -Row machine 20# 2X10   -Moist heat to left shoulder X 8 min with HEP progressions printed and reviewed with her. 12/31/21 -Pulleys 2 min flexion, 2 min scaption -Shoulder rows with red X20 -Shoulder extensions with red X20 -Shoulder ER bilat with red X 15 -Shoulder Horizontal abd bilat with red X15 -Shoulder chest press into flexion AAROM X15 -Shoulder abd AAROM X 15 -Shoulder ER AAROM X15 -Shoulder IR/ER AROM stretch holding 5 sec X 10 each.  -Moist heat to left shoulder X 8 min with HEP progressions printed and reviewed with her.     PATIENT EDUCATION: Education details: Educated pt on anatomy and physiology of current symptoms, FOTO diagnosis, prognosis, HEP,  and POC. Person educated: Patient Education method: Customer service manager Education comprehension: verbalized understanding and returned demonstration     HOME EXERCISE PROGRAM: Access Code: [email or web address] URL: [email or web address] Date: 12/31/2021 Prepared by: Elsie Ra  Exercises - Seated Scapular Retraction  - 2 x daily - 6 x weekly - 2 sets - 10 reps -  5 hold - Seated Upper Trapezius Stretch  - 2 x daily - 6 x weekly - 2 sets - 2 reps - 30 hold - Standing Shoulder Row with Anchored Resistance  - 2 x daily - 6 x weekly - 2 sets - 10 reps - Shoulder extension with resistance - Neutral  - 2 x daily - 6 x weekly - 2 sets - 10 reps - Shoulder External Rotation and Scapular Retraction with Resistance  - 2 x daily - 6 x weekly - 2 sets - 10 reps - Supine Shoulder Flexion with Dowel  - 1 x daily - 3 x weekly - 1 sets - 15 reps - Supine  Shoulder External Rotation in 45 Degrees Abduction AAROM with Dowel  - 2 x daily - 6 x weekly - 1 sets - 15 reps - Supine Shoulder Abduction AAROM with Dowel  - 2 x daily - 6 x weekly - 1 sets - 15 reps   ASSESSMENT:   CLINICAL IMPRESSION: She appears to be making progress with less overall shoulder pain. She does still have some weakness in flexion and abd in left shoulder compared to Rt so we will continue to work to improve this with PT.   OBJECTIVE IMPAIRMENTS decreased activity tolerance, decreased knowledge of condition, decreased strength, impaired UE functional use, and pain.    ACTIVITY LIMITATIONS sleeping and bathing   PARTICIPATION LIMITATIONS: yard work   PERSONAL FACTORS 3+ comorbidities: Aneima, Arthritis, HTN, hyperlipidemia, Hypokalemia.   are also affecting patient's functional outcome.    REHAB POTENTIAL: Excellent   CLINICAL DECISION MAKING: Stable/uncomplicated   EVALUATION COMPLEXITY: Low     GOALS:    LONG TERM GOALS: Target date: 01/26/2022     Pt will be independent with advanced HEP to continue to address postural limitations and muscle imbalances. Baseline: Provided at eval.  Goal status: ongoing   2.  Pt will improve FOTO function score to no less than 75% as proxy for functional improvement Baseline: 74% at initial  Goal status: ongoing   3.  Pt will report </= 2/10 pain at baseline  Baseline: 3/10 Goal status: ongoing   4.  Pt will demonstrate 4+ strength with L shoulder flexion/ Abduction Baseline: 4/5 Goal status: ongoing   5.  Pt will report no increase in L shoulder pain upon sleeping on it.  Baseline: 3/10 Goal status: ongoing.   PLAN: PT FREQUENCY: 1x/week   PT DURATION: 6 weeks   PLANNED INTERVENTIONS: Therapeutic exercises, Therapeutic activity, Neuromuscular re-education, Patient/Family education, Self Care, Joint mobilization, Joint manipulation, Dry Needling, Electrical stimulation, Spinal manipulation, Spinal mobilization,  Cryotherapy, Moist heat, Taping, Vasopneumatic device, Traction, Ultrasound, Ionotophoresis '4mg'$ /ml Dexamethasone, Manual therapy, and Re-evaluation   PLAN FOR NEXT SESSION what did MD say? continue to progress functional mobility, strengthen parascapular muscles. Decrease patients pain and help minimize pain with sleeping.     Lynden Ang, PT,DPT 01/08/2022, 10:00 AM

## 2022-01-12 NOTE — Therapy (Signed)
OUTPATIENT PHYSICAL THERAPY TREATMENT NOTE   Patient Name: Lisa Hutchinson MRN: [id number] DOB:1950/07/08, 71 y.o., female Today's Date: 01/13/2022  END OF SESSION:   PT End of Session - 01/13/22 1024     Visit Number 4    Number of Visits 7    Date for PT Re-Evaluation 01/26/22    Authorization Type MEDICARE PART A AND B    PT Start Time 1015    PT Stop Time 1057    PT Time Calculation (min) 42 min    Activity Tolerance Patient tolerated treatment well    Behavior During Therapy [facility] for tasks assessed/performed             Past Medical History:  Diagnosis Date   Anemia    with pregnancy    Arthritis    knees   Atrophic vaginitis    Cataract    bilateral   Edema    H/O mammogram 05/03/12   Hyperlipidemia    no medicines    Hypertension    Hypokalemia    Sleep difficulties    sleep study normal, fall 2013   Past Surgical History:  Procedure Laterality Date   ABDOMINAL HYSTERECTOMY     fribroids   BREAST LUMPECTOMY     benign   COLONOSCOPY  09/2010; 2007   tubular adenoma, Dr. Verl Blalock   POLYPECTOMY     TOTAL HIP ARTHROPLASTY Right 07/14/2021   Procedure: RIGHT TOTAL HIP ARTHROPLASTY ANTERIOR APPROACH;  Surgeon: Leandrew Koyanagi, MD;  Location: [place];  Service: Orthopedics;  Laterality: Right;  3-C   Patient Active Problem List   Diagnosis Date Noted   Status post total hip replacement, right 07/14/2021   Primary osteoarthritis of right hip 07/13/2021   Urinary frequency 05/16/2021   Glaucoma 12/03/2020   ACE-inhibitor cough 11/30/2019   Osteopenia 12/20/2017   Arthritis 07/15/2015   Obesity (BMI 30-39.9) 07/15/2015   Atrophic vaginitis 08/10/2013   Hypertension 05/27/2011   Family history of colonic polyps 10/22/2010   Hx of adenomatous colonic polyps 10/22/2010    THERAPY DIAG:  Chronic left shoulder pain  Muscle weakness (generalized)  Abnormal posture  PCP: Denita Lung, MD   REFERRING PROVIDER: Denita Lung, MD   REFERRING  DIAG: Acute pain of left shoulder [M25.512]   Rationale for Evaluation and Treatment Rehabilitation   ONSET DATE: May/ June   SUBJECTIVE:                                                                                                                                                                                       SUBJECTIVE STATEMENT: She has been doing good,  continuing doing the exercises and she feels that they are helping stiffness and pain. She has been sleeping some on her Lt side and woke up last night on her back, but she didn't wake up due to pain. She has added a pillow between her Lt arm and body as Naaman Plummer suggested. She is going on vacation this weekend to [facility].   PERTINENT HISTORY: Aneima, Arthritis, HTN, hyperlipidemia, Hypokalemia.    PAIN:  Are you having pain? Yes: NPRS scale: 0/10, highest since last appointment to 2/10 throbbing pain at night Pain location: L shoulder in biceps. Pt reports intermittent stretching.  Pain description: Achy pain, Intermittent.  Aggravating factors: Nothing in particular brings on pain. Pt reports sleeping on it is painful.  Relieving factors: Tylenol.    PRECAUTIONS: None   WEIGHT BEARING RESTRICTIONS No   FALLS:  Has patient fallen in last 6 months? No   LIVING ENVIRONMENT: Lives with: lives alone Lives in: House/apartment Stairs: Yes: Internal: 1 steps; bilateral but cannot reach both and External: 2 steps; none Has following equipment at home: None   OCCUPATION: Retired   PLOF: Independent   PATIENT GOALS Pt would like to get rid of her shoulder pain.    OBJECTIVE:    DIAGNOSTIC FINDINGS:  None   PATIENT SURVEYS:  FOTO 74% eval, Predicted 75% in 8 visits.   COGNITION:           Overall cognitive status: Within functional limits for tasks assessed                                  SENSATION: WFL   POSTURE: Rounded shoulders, forward head.    UPPER EXTREMITY ROM:                         Globally WFL.    UPPER EXTREMITY MMT:   MMT Right eval Left eval Left 01/07/22  Shoulder flexion 4+ 4 4  Shoulder extension       Shoulder abduction 4+ 4 4  Shoulder adduction       Shoulder internal rotation 4+ 4+ 4+  Shoulder external rotation 4+ 4+ 4+  Middle trapezius       Lower trapezius       Grip strength (lbs)       (Blank rows = not tested)   SHOULDER SPECIAL TESTS:            Instability tests: Load and shift test: negative            Rotator cuff assessment: Drop arm test: negative and Empty can test: positive             Biceps assessment: Speed's test: negative   JOINT MOBILITY TESTING:  Normal.    PALPATION:  No tenderness with palpation.              TODAY'S TREATMENT:  01/13/22 TherEx: -UBE L2 2 min fwd, 2 min retro -Pulleys 2 min flexion, 2 min scaption with cues for looking at hand with movement overhead to facilitate upper body movement patterns -Supine cervical retraction x 10 with verbal, tactile cueing -Standing cervical retraction x 15 with verbal, tactile, visual cueing of mirror -Seated bil scapular retraction and shoulder ER with green band 2 x 10 -Seated bil shoulder flexion 2# bar 2 x 8 -Sidelying Lt shoulder abd 2# weight 2 x 10 -Chest press machine 5#  2X12 -Row machine 20# 2X12  TherAct: -PT educated in sidelying positioning to reduce strain on Lt shoulder with addition of pillow under Rt arm/hip and under chest, pt verbalized understanding and returned demo of set up  01/07/22 -UBE L2 2 min fwd, 2 min retro -Pulleys 2 min flexion, 2 min scaption -Shoulder bilat flexion 2# 2X10 -Shoulder bilat abd 2# 2X10 -Shoulder ER bilat with red 2X10 -Shoulder Horizontal abd bilat with red 2X10 -Shoulder flexion 2#bar AAROM X10 -Shoulder abd AAROM X 10 2# bar -Shoulder ER AAROM X10 2# bar -Shoulder IR/ER AROM stretch holding 5 sec X 10 each. -Chest press machine 5# 2X10 -Row machine 20# 2X10  -Moist heat to left shoulder X 8 min with HEP  progressions printed and reviewed with her.  12/31/21 -Pulleys 2 min flexion, 2 min scaption -Shoulder rows with red X20 -Shoulder extensions with red X20 -Shoulder ER bilat with red X 15 -Shoulder Horizontal abd bilat with red X15 -Shoulder chest press into flexion AAROM X15 -Shoulder abd AAROM X 15 -Shoulder ER AAROM X15 -Shoulder IR/ER AROM stretch holding 5 sec X 10 each.  -Moist heat to left shoulder X 8 min with HEP progressions printed and reviewed with her.     PATIENT EDUCATION: Education details: Educated pt on anatomy and physiology of current symptoms, FOTO diagnosis, prognosis, HEP,  and POC. Person educated: Patient Education method: Customer service manager Education comprehension: verbalized understanding and returned demonstration     HOME EXERCISE PROGRAM: Access Code: [email or web address] URL: [email or web address] Date: 12/31/2021 Prepared by: Elsie Ra  Exercises - Seated Scapular Retraction  - 2 x daily - 6 x weekly - 2 sets - 10 reps - 5 hold - Seated Upper Trapezius Stretch  - 2 x daily - 6 x weekly - 2 sets - 2 reps - 30 hold - Standing Shoulder Row with Anchored Resistance  - 2 x daily - 6 x weekly - 2 sets - 10 reps - Shoulder extension with resistance - Neutral  - 2 x daily - 6 x weekly - 2 sets - 10 reps - Shoulder External Rotation and Scapular Retraction with Resistance  - 2 x daily - 6 x weekly - 2 sets - 10 reps - Supine Shoulder Flexion with Dowel  - 1 x daily - 3 x weekly - 1 sets - 15 reps - Supine Shoulder External Rotation in 45 Degrees Abduction AAROM with Dowel  - 2 x daily - 6 x weekly - 1 sets - 15 reps - Supine Shoulder Abduction AAROM with Dowel  - 2 x daily - 6 x weekly - 1 sets - 15 reps   ASSESSMENT:   CLINICAL IMPRESSION: She presented with decreased pain and reports sleeping better. She was given addition advice today for sleeping positioning for neutral shoulder posture. She tolerated session well with no increase in  sx with activity. She continues to benefit from shoulder strengthening and functional activity.   OBJECTIVE IMPAIRMENTS decreased activity tolerance, decreased knowledge of condition, decreased strength, impaired UE functional use, and pain.    ACTIVITY LIMITATIONS sleeping and bathing   PARTICIPATION LIMITATIONS: yard work   PERSONAL FACTORS 3+ comorbidities: Aneima, Arthritis, HTN, hyperlipidemia, Hypokalemia.   are also affecting patient's functional outcome.    REHAB POTENTIAL: Excellent   CLINICAL DECISION MAKING: Stable/uncomplicated   EVALUATION COMPLEXITY: Low     GOALS:    LONG TERM GOALS: Target date: 01/26/2022     Pt will be independent with advanced HEP to continue to address postural  limitations and muscle imbalances. Baseline: Provided at eval.  Goal status: ongoing   2.  Pt will improve FOTO function score to no less than 75% as proxy for functional improvement Baseline: 74% at initial  Goal status: ongoing   3.  Pt will report </= 2/10 pain at baseline  Baseline: 3/10 Goal status: ongoing   4.  Pt will demonstrate 4+ strength with L shoulder flexion/ Abduction Baseline: 4/5 Goal status: ongoing   5.  Pt will report no increase in L shoulder pain upon sleeping on it.  Baseline: 3/10 Goal status: ongoing.   PLAN: PT FREQUENCY: 1x/week   PT DURATION: 6 weeks   PLANNED INTERVENTIONS: Therapeutic exercises, Therapeutic activity, Neuromuscular re-education, Patient/Family education, Self Care, Joint mobilization, Joint manipulation, Dry Needling, Electrical stimulation, Spinal manipulation, Spinal mobilization, Cryotherapy, Moist heat, Taping, Vasopneumatic device, Traction, Ultrasound, Ionotophoresis '4mg'$ /ml Dexamethasone, Manual therapy, and Re-evaluation   PLAN FOR NEXT SESSION continue to progress functional mobility and scapular/RTC strengthening. Follow up about sleep tolerance   Jana Hakim, Student-PT 01/13/2022, 12:39 PM  This entire session  of physical therapy was performed under the direct supervision of PT signing evaluation /treatment. PT reviewed note and agrees.  Jamey Reas, PT, DPT 01/13/2022, 1:25 PM

## 2022-01-13 ENCOUNTER — Ambulatory Visit (INDEPENDENT_AMBULATORY_CARE_PROVIDER_SITE_OTHER): Payer: Medicare Other | Admitting: Physical Therapy

## 2022-01-13 ENCOUNTER — Encounter: Payer: Self-pay | Admitting: Physical Therapy

## 2022-01-13 DIAGNOSIS — M25512 Pain in left shoulder: Secondary | ICD-10-CM

## 2022-01-13 DIAGNOSIS — G8929 Other chronic pain: Secondary | ICD-10-CM | POA: Diagnosis not present

## 2022-01-13 DIAGNOSIS — R293 Abnormal posture: Secondary | ICD-10-CM | POA: Diagnosis not present

## 2022-01-13 DIAGNOSIS — M6281 Muscle weakness (generalized): Secondary | ICD-10-CM

## 2022-01-20 ENCOUNTER — Other Ambulatory Visit (INDEPENDENT_AMBULATORY_CARE_PROVIDER_SITE_OTHER): Payer: Medicare Other

## 2022-01-20 ENCOUNTER — Encounter: Payer: Self-pay | Admitting: Physical Therapy

## 2022-01-20 ENCOUNTER — Ambulatory Visit (INDEPENDENT_AMBULATORY_CARE_PROVIDER_SITE_OTHER): Payer: Medicare Other | Admitting: Physical Therapy

## 2022-01-20 DIAGNOSIS — Z23 Encounter for immunization: Secondary | ICD-10-CM | POA: Diagnosis not present

## 2022-01-20 DIAGNOSIS — R293 Abnormal posture: Secondary | ICD-10-CM

## 2022-01-20 DIAGNOSIS — G8929 Other chronic pain: Secondary | ICD-10-CM

## 2022-01-20 DIAGNOSIS — M6281 Muscle weakness (generalized): Secondary | ICD-10-CM

## 2022-01-20 DIAGNOSIS — M25512 Pain in left shoulder: Secondary | ICD-10-CM | POA: Diagnosis not present

## 2022-01-20 NOTE — Therapy (Signed)
OUTPATIENT PHYSICAL THERAPY TREATMENT NOTE/Discharge PHYSICAL THERAPY DISCHARGE SUMMARY  Visits from Start of Care: 7  Current functional level related to goals / functional outcomes: See below   Remaining deficits: See below   Education / Equipment: HEP  Plan:  Patient goals were  met. Patient is being discharged due to meeting PT goals and being satisfied with her progress      Patient Name: Lisa Hutchinson MRN: [id number] DOB:[birth year], 71 y.o., female Today's Date: 01/20/2022  END OF SESSION:   PT End of Session - 01/20/22 1000     Visit Number 5    Number of Visits 7    Date for PT Re-Evaluation 01/26/22    Authorization Type MEDICARE PART A AND B    PT Start Time 0956    PT Stop Time 1034    PT Time Calculation (min) 38 min    Activity Tolerance Patient tolerated treatment well    Behavior During Therapy [facility] for tasks assessed/performed             Past Medical History:  Diagnosis Date   Anemia    with pregnancy    Arthritis    knees   Atrophic vaginitis    Cataract    bilateral   Edema    H/O mammogram 05/03/12   Hyperlipidemia    no medicines    Hypertension    Hypokalemia    Sleep difficulties    sleep study normal, fall 2013   Past Surgical History:  Procedure Laterality Date   ABDOMINAL HYSTERECTOMY     fribroids   BREAST LUMPECTOMY     benign   COLONOSCOPY  09/2010; 2007   tubular adenoma, Dr. Verl Blalock   POLYPECTOMY     TOTAL HIP ARTHROPLASTY Right 07/14/2021   Procedure: RIGHT TOTAL HIP ARTHROPLASTY ANTERIOR APPROACH;  Surgeon: Leandrew Koyanagi, MD;  Location: [place];  Service: Orthopedics;  Laterality: Right;  3-C   Patient Active Problem List   Diagnosis Date Noted   Status post total hip replacement, right 07/14/2021   Primary osteoarthritis of right hip 07/13/2021   Urinary frequency 05/16/2021   Glaucoma 12/03/2020   ACE-inhibitor cough 11/30/2019   Osteopenia 12/20/2017   Arthritis 07/15/2015   Obesity (BMI 30-39.9)  07/15/2015   Atrophic vaginitis 08/10/2013   Hypertension 05/27/2011   Family history of colonic polyps 10/22/2010   Hx of adenomatous colonic polyps 10/22/2010    THERAPY DIAG:  Chronic left shoulder pain  Muscle weakness (generalized)  Abnormal posture  PCP: Denita Lung, MD   REFERRING PROVIDER: Denita Lung, MD   REFERRING DIAG: Acute pain of left shoulder [M25.512]   Rationale for Evaluation and Treatment Rehabilitation   ONSET DATE: May/ June   SUBJECTIVE:  SUBJECTIVE STATEMENT: She is not really having pain except for sleeping, feels ready to discharge today.   PERTINENT HISTORY: Aneima, Arthritis, HTN, hyperlipidemia, Hypokalemia.    PAIN:  Are you having pain? Yes: NPRS scale: 0/10, highest since last appointment to 2/10 throbbing pain at night Pain location: L shoulder in biceps. Pt reports intermittent stretching.  Pain description: Achy pain, Intermittent.  Aggravating factors: Nothing in particular brings on pain. Pt reports sleeping on it is painful.  Relieving factors: Tylenol.    PRECAUTIONS: None   WEIGHT BEARING RESTRICTIONS No   FALLS:  Has patient fallen in last 6 months? No   LIVING ENVIRONMENT: Lives with: lives alone Lives in: House/apartment Stairs: Yes: Internal: 1 steps; bilateral but cannot reach both and External: 2 steps; none Has following equipment at home: None   OCCUPATION: Retired   PLOF: Independent   PATIENT GOALS Pt would like to get rid of her shoulder pain.    OBJECTIVE:    DIAGNOSTIC FINDINGS:  None   PATIENT SURVEYS:  FOTO 74% eval, Predicted 75% in 8 visits. FOTO improved to 84% and met goal for this 01/20/22   COGNITION:           Overall cognitive status: Within functional limits for tasks assessed                                   SENSATION: WFL   POSTURE: Rounded shoulders, forward head.    UPPER EXTREMITY ROM:                        Globally WFL.    UPPER EXTREMITY MMT:   MMT Right eval Left eval Left 01/07/22 Left 01/20/22  Shoulder flexion 4+ _0 Shoulder extension        Shoulder abduction 4+ _1 Shoulder adduction        Shoulder internal rotation 4+ 4+ 4+ 5  Shoulder external rotation 4+ 4+ 4+ 5  Middle trapezius        Lower trapezius        Grip strength (lbs)        (Blank rows = not tested)   SHOULDER SPECIAL TESTS:            Instability tests: Load and shift test: negative            Rotator cuff assessment: Drop arm test: negative and Empty can test: positive             Biceps assessment: Speed's test: negative   JOINT MOBILITY TESTING:  Normal.    PALPATION:  No tenderness with palpation.              TODAY'S TREATMENT:  01/20/22 TherEx: -UBE L2 2 min fwd, 2 min retro  -Rows green X20  -Shoulder extensions green X20  -H abd green X15 -Seated bil scapular retraction and shoulder ER with green band 2 x 10 -Standing bil shoulder flexion 2# bar X15 -Standing bilat shoulder IR behind back 2# bar X15 -Chest press machine 5# 2X10 -Row machine 20# 2X10 -posterior capsule stretch 10 sec X10 -Doorway stretch 5 sec X10    PATIENT EDUCATION: Education details: Educated pt on anatomy and physiology of current symptoms, FOTO diagnosis, prognosis, HEP,  and POC. Person educated: Patient Education method: Customer service manager Education comprehension: verbalized understanding and returned demonstration  HOME EXERCISE PROGRAM: Access Code: [email or web address] URL: [email or web address] Date: 12/31/2021 Prepared by: Elsie Ra  Exercises - Seated Scapular Retraction  - 2 x daily - 6 x weekly - 2 sets - 10 reps - 5 hold - Seated Upper Trapezius Stretch  - 2 x daily - 6 x weekly - 2 sets - 2 reps - 30 hold - Standing Shoulder Row with Anchored  Resistance  - 2 x daily - 6 x weekly - 2 sets - 10 reps - Shoulder extension with resistance - Neutral  - 2 x daily - 6 x weekly - 2 sets - 10 reps - Shoulder External Rotation and Scapular Retraction with Resistance  - 2 x daily - 6 x weekly - 2 sets - 10 reps - Supine Shoulder Flexion with Dowel  - 1 x daily - 3 x weekly - 1 sets - 15 reps - Supine Shoulder External Rotation in 45 Degrees Abduction AAROM with Dowel  - 2 x daily - 6 x weekly - 1 sets - 15 reps - Supine Shoulder Abduction AAROM with Dowel  - 2 x daily - 6 x weekly - 1 sets - 15 reps   ASSESSMENT:   CLINICAL IMPRESSION: She has made great progress with PT and has met PT goals except does still have some pain with sleeping, this should hopefully continue to improve with more time and HEP work. She is pleased with her progress and had no further questions or concerns about DC.   OBJECTIVE IMPAIRMENTS decreased activity tolerance, decreased knowledge of condition, decreased strength, impaired UE functional use, and pain.    ACTIVITY LIMITATIONS sleeping and bathing   PARTICIPATION LIMITATIONS: yard work   PERSONAL FACTORS 3+ comorbidities: Aneima, Arthritis, HTN, hyperlipidemia, Hypokalemia.   are also affecting patient's functional outcome.    REHAB POTENTIAL: Excellent   CLINICAL DECISION MAKING: Stable/uncomplicated   EVALUATION COMPLEXITY: Low     GOALS:    LONG TERM GOALS: Target date: 01/26/2022     Pt will be independent with advanced HEP to continue to address postural limitations and muscle imbalances. Baseline:  Goal status: MET   2.  Pt will improve FOTO function score to no less than 75% as proxy for functional improvement Baseline: 84 now Goal status: MET   3.  Pt will report </= 2/10 pain at baseline  Baseline:  Goal status: MET   4.  Pt will demonstrate 4+ strength with L shoulder flexion/ Abduction Baseline:  Goal status: MET   5.  Pt will report no increase in L shoulder pain upon sleeping  on it.  Baseline: 3/10 Goal status: partially met, has improved but still with some pain.   PLAN: PT FREQUENCY: 1x/week   PT DURATION: 6 weeks   PLANNED INTERVENTIONS: Therapeutic exercises, Therapeutic activity, Neuromuscular re-education, Patient/Family education, Self Care, Joint mobilization, Joint manipulation, Dry Needling, Electrical stimulation, Spinal manipulation, Spinal mobilization, Cryotherapy, Moist heat, Taping, Vasopneumatic device, Traction, Ultrasound, Ionotophoresis 3m/ml Dexamethasone, Manual therapy, and Re-evaluation   PLAN FOR NEXT SESSION continue to progress functional mobility and scapular/RTC strengthening. Follow up about sleep tolerance   BDebbe Odea PT,DPT 01/20/2022, 10:30 AM

## 2022-01-26 ENCOUNTER — Encounter: Payer: Medicare Other | Admitting: Physical Therapy

## 2022-01-28 ENCOUNTER — Telehealth: Payer: Self-pay | Admitting: Orthopaedic Surgery

## 2022-01-28 NOTE — Telephone Encounter (Signed)
sure

## 2022-01-28 NOTE — Telephone Encounter (Signed)
Pt called requesting a renewal handicap placard. Please call pt when ready for pick up at [phone].

## 2022-01-29 NOTE — Telephone Encounter (Signed)
Spoke with patient. Left handicap application up front for pick up. She will come by tomorrow to get it.

## 2022-01-30 DIAGNOSIS — H401131 Primary open-angle glaucoma, bilateral, mild stage: Secondary | ICD-10-CM | POA: Diagnosis not present

## 2022-01-30 DIAGNOSIS — H2513 Age-related nuclear cataract, bilateral: Secondary | ICD-10-CM | POA: Diagnosis not present

## 2022-01-30 DIAGNOSIS — H2511 Age-related nuclear cataract, right eye: Secondary | ICD-10-CM | POA: Diagnosis not present

## 2022-02-03 ENCOUNTER — Encounter: Payer: Medicare Other | Admitting: Physical Therapy

## 2022-02-03 ENCOUNTER — Encounter: Payer: Self-pay | Admitting: Internal Medicine

## 2022-02-11 ENCOUNTER — Other Ambulatory Visit (INDEPENDENT_AMBULATORY_CARE_PROVIDER_SITE_OTHER): Payer: Medicare Other

## 2022-02-11 DIAGNOSIS — Z23 Encounter for immunization: Secondary | ICD-10-CM | POA: Diagnosis not present

## 2022-02-16 ENCOUNTER — Encounter: Payer: Self-pay | Admitting: Internal Medicine

## 2022-02-18 ENCOUNTER — Other Ambulatory Visit: Payer: Medicare Other

## 2022-04-06 DIAGNOSIS — H401133 Primary open-angle glaucoma, bilateral, severe stage: Secondary | ICD-10-CM | POA: Diagnosis not present

## 2022-04-09 DIAGNOSIS — H401111 Primary open-angle glaucoma, right eye, mild stage: Secondary | ICD-10-CM | POA: Diagnosis not present

## 2022-04-09 DIAGNOSIS — H2511 Age-related nuclear cataract, right eye: Secondary | ICD-10-CM | POA: Diagnosis not present

## 2022-04-10 ENCOUNTER — Ambulatory Visit (INDEPENDENT_AMBULATORY_CARE_PROVIDER_SITE_OTHER): Payer: Medicare Other | Admitting: Family Medicine

## 2022-04-10 ENCOUNTER — Encounter: Payer: Self-pay | Admitting: Family Medicine

## 2022-04-10 VITALS — BP 136/80 | HR 73 | Temp 97.6°F | Wt 173.4 lb

## 2022-04-10 DIAGNOSIS — H25012 Cortical age-related cataract, left eye: Secondary | ICD-10-CM | POA: Diagnosis not present

## 2022-04-10 DIAGNOSIS — D1722 Benign lipomatous neoplasm of skin and subcutaneous tissue of left arm: Secondary | ICD-10-CM | POA: Diagnosis not present

## 2022-04-10 DIAGNOSIS — H2512 Age-related nuclear cataract, left eye: Secondary | ICD-10-CM | POA: Diagnosis not present

## 2022-04-10 DIAGNOSIS — H25042 Posterior subcapsular polar age-related cataract, left eye: Secondary | ICD-10-CM | POA: Diagnosis not present

## 2022-04-10 NOTE — Progress Notes (Signed)
   Subjective:    Patient ID: Lisa Hutchinson, female    DOB: [birth year], 71 y.o.   MRN: [id number]  HPI She is here for evaluation of a lesion on her left shoulder.   Review of Systems     Objective:   Physical Exam  Exam of the left posterior shoulder shows a 2 cm round smooth movable lesion.      Assessment & Plan:   Lipoma of left upper extremity I explained that this is a lipoma and of no major concern.  No further intervention needed.  She was comfortable with that.

## 2022-05-07 DIAGNOSIS — H2512 Age-related nuclear cataract, left eye: Secondary | ICD-10-CM | POA: Diagnosis not present

## 2022-05-07 DIAGNOSIS — H401121 Primary open-angle glaucoma, left eye, mild stage: Secondary | ICD-10-CM | POA: Diagnosis not present

## 2022-06-02 ENCOUNTER — Encounter: Payer: Self-pay | Admitting: Family Medicine

## 2022-06-02 ENCOUNTER — Ambulatory Visit (INDEPENDENT_AMBULATORY_CARE_PROVIDER_SITE_OTHER): Payer: Medicare Other | Admitting: Family Medicine

## 2022-06-02 VITALS — BP 134/82 | HR 70 | Temp 97.9°F | Resp 16 | Wt 175.2 lb

## 2022-06-02 DIAGNOSIS — M1990 Unspecified osteoarthritis, unspecified site: Secondary | ICD-10-CM

## 2022-06-02 DIAGNOSIS — Z83719 Family history of colon polyps, unspecified: Secondary | ICD-10-CM

## 2022-06-02 DIAGNOSIS — Z8669 Personal history of other diseases of the nervous system and sense organs: Secondary | ICD-10-CM

## 2022-06-02 DIAGNOSIS — E669 Obesity, unspecified: Secondary | ICD-10-CM

## 2022-06-02 DIAGNOSIS — I10 Essential (primary) hypertension: Secondary | ICD-10-CM

## 2022-06-02 DIAGNOSIS — Z13220 Encounter for screening for lipoid disorders: Secondary | ICD-10-CM | POA: Diagnosis not present

## 2022-06-02 DIAGNOSIS — Z6830 Body mass index (BMI) 30.0-30.9, adult: Secondary | ICD-10-CM | POA: Diagnosis not present

## 2022-06-02 DIAGNOSIS — R7303 Prediabetes: Secondary | ICD-10-CM | POA: Diagnosis not present

## 2022-06-02 DIAGNOSIS — Z86010 Personal history of colonic polyps: Secondary | ICD-10-CM

## 2022-06-02 DIAGNOSIS — Z9849 Cataract extraction status, unspecified eye: Secondary | ICD-10-CM | POA: Diagnosis not present

## 2022-06-02 DIAGNOSIS — Z1321 Encounter for screening for nutritional disorder: Secondary | ICD-10-CM | POA: Diagnosis not present

## 2022-06-02 DIAGNOSIS — M8580 Other specified disorders of bone density and structure, unspecified site: Secondary | ICD-10-CM | POA: Diagnosis not present

## 2022-06-02 DIAGNOSIS — H409 Unspecified glaucoma: Secondary | ICD-10-CM | POA: Diagnosis not present

## 2022-06-02 LAB — LIPID PANEL

## 2022-06-02 LAB — POCT GLYCOSYLATED HEMOGLOBIN (HGB A1C): Hemoglobin A1C: 5.9 % — AB (ref 4.0–5.6)

## 2022-06-02 NOTE — Progress Notes (Signed)
   Subjective:    Patient ID: Lisa Hutchinson, female    DOB: [birth year], 72 y.o.   MRN: [id number]  HPI She is here for an interval evaluation.  She continues on a calcium and vitamin D supplement.  She apparently did have a recent DEXA scan which did show osteopenia.  She sees ophthalmology regularly and has had recent cataract surgery.  She also has a history of glaucoma.  Continues on losartan/HCTZ without difficulty.  She does have arthritic symptoms but seems to have these under good control.  She is scheduled for routine colonoscopy for her family history and her history of adenomatous colonic polyps.  She states that she has a history of OSA but apparently was not to the extent that required CPAP.  She has now been told that she does have apneic episodes at night.  She does complain of some daytime fatigue.  She would also like some help with dietary intervention to help with weight reduction.  She does have a history of prediabetes. Hemoglobin A1c is 5.9  Review of Systems     Objective:   Physical Exam Alert and in no distress. Tympanic membranes and canals are normal. Pharyngeal area is normal. Neck is supple without adenopathy or thyromegaly. Cardiac exam shows a regular sinus rhythm without murmurs or gallops. Lungs are clear to auscultation.        Assessment & Plan:  Primary hypertension - Plan: CBC with Differential/Platelet, Comprehensive metabolic panel  Arthritis  Osteopenia, unspecified location - Plan: VITAMIN D 25 Hydroxy (Vit-D Deficiency, Fractures)  Hx of adenomatous colonic polyps  Obesity (BMI 30-39.9) - Plan: Amb ref to Medical Nutrition Therapy-MNT  Glaucoma, unspecified glaucoma type, unspecified laterality  Family history of colonic polyps  Screening for lipid disorders - Plan: Lipid panel  Encounter for vitamin deficiency screening - Plan: VITAMIN D 25 Hydroxy (Vit-D Deficiency, Fractures)  History of cataract extraction, unspecified  laterality  Prediabetes - Plan: Amb ref to Medical Nutrition Therapy-MNT, POCT glycosylated hemoglobin (Hb A1C)  History of sleep apnea - Plan: Home sleep test  Body mass index (BMI) 30.0-30.9, adult - Plan: VITAMIN D 25 Hydroxy (Vit-D Deficiency, Fractures) I discussed diet and exercise with her in detail.  Discussed the [place].  Will refer to nutritionist to help with this.  Encouraged increased physical activity although she seems to doing a fairly decent job.  She will follow-up with ophthalmology as needed.  Will also refer her for sleep study to further assess her OSA.

## 2022-06-03 LAB — COMPREHENSIVE METABOLIC PANEL
ALT: 9 IU/L (ref 0–32)
AST: 22 IU/L (ref 0–40)
Albumin/Globulin Ratio: 1.9 (ref 1.2–2.2)
Albumin: 4.2 g/dL (ref 3.9–4.9)
Alkaline Phosphatase: 82 IU/L (ref 44–121)
BUN/Creatinine Ratio: 12 (ref 12–28)
BUN: 9 mg/dL (ref 8–27)
Bilirubin Total: 0.6 mg/dL (ref 0.0–1.2)
CO2: 24 mmol/L (ref 20–29)
Calcium: 9.8 mg/dL (ref 8.7–10.3)
Chloride: 102 mmol/L (ref 96–106)
Creatinine, Ser: 0.74 mg/dL (ref 0.57–1.00)
Globulin, Total: 2.2 g/dL (ref 1.5–4.5)
Glucose: 81 mg/dL (ref 70–99)
Potassium: 3.9 mmol/L (ref 3.5–5.2)
Sodium: 139 mmol/L (ref 134–144)
Total Protein: 6.4 g/dL (ref 6.0–8.5)
eGFR: 88 mL/min/{1.73_m2} (ref >59)

## 2022-06-03 LAB — CBC WITH DIFFERENTIAL/PLATELET
Basophils Absolute: 0.1 10*3/uL (ref 0.0–0.2)
Basos: 1 %
EOS (ABSOLUTE): 0.2 10*3/uL (ref 0.0–0.4)
Eos: 3 %
Hematocrit: 43.3 % (ref 34.0–46.6)
Hemoglobin: 14.6 g/dL (ref 11.1–15.9)
Immature Grans (Abs): 0 10*3/uL (ref 0.0–0.1)
Immature Granulocytes: 0 %
Lymphocytes Absolute: 2 10*3/uL (ref 0.7–3.1)
Lymphs: 38 %
MCH: 29.4 pg (ref 26.6–33.0)
MCHC: 33.7 g/dL (ref 31.5–35.7)
MCV: 87 fL (ref 79–97)
Monocytes Absolute: 0.7 10*3/uL (ref 0.1–0.9)
Monocytes: 12 %
Neutrophils Absolute: 2.5 10*3/uL (ref 1.4–7.0)
Neutrophils: 46 %
Platelets: 278 10*3/uL (ref 150–450)
RBC: 4.96 x10E6/uL (ref 3.77–5.28)
RDW: 12.2 % (ref 11.7–15.4)
WBC: 5.4 10*3/uL (ref 3.4–10.8)

## 2022-06-03 LAB — LIPID PANEL
Chol/HDL Ratio: 3 ratio (ref 0.0–4.4)
Cholesterol, Total: 188 mg/dL (ref 100–199)
HDL: 63 mg/dL (ref >39)
LDL Chol Calc (NIH): 112 mg/dL — ABNORMAL HIGH (ref 0–99)
Triglycerides: 70 mg/dL (ref 0–149)
VLDL Cholesterol Cal: 13 mg/dL (ref 5–40)

## 2022-06-03 LAB — VITAMIN D 25 HYDROXY (VIT D DEFICIENCY, FRACTURES): Vit D, 25-Hydroxy: 27.1 ng/mL — ABNORMAL LOW (ref 30.0–100.0)

## 2022-06-08 DIAGNOSIS — G4733 Obstructive sleep apnea (adult) (pediatric): Secondary | ICD-10-CM

## 2022-06-08 HISTORY — DX: Obstructive sleep apnea (adult) (pediatric): G47.33

## 2022-07-06 DIAGNOSIS — H10503 Unspecified blepharoconjunctivitis, bilateral: Secondary | ICD-10-CM | POA: Diagnosis not present

## 2022-07-08 ENCOUNTER — Ambulatory Visit (INDEPENDENT_AMBULATORY_CARE_PROVIDER_SITE_OTHER): Payer: Medicare Other | Admitting: Orthopaedic Surgery

## 2022-07-08 ENCOUNTER — Ambulatory Visit (INDEPENDENT_AMBULATORY_CARE_PROVIDER_SITE_OTHER): Payer: Medicare Other

## 2022-07-08 DIAGNOSIS — Z96641 Presence of right artificial hip joint: Secondary | ICD-10-CM

## 2022-07-08 NOTE — Progress Notes (Signed)
Office Visit Note   Patient: Lisa Hutchinson           Date of Birth: 02-Jun-1950           MRN: [id number] Visit Date: 07/08/2022              Requested by: Lisa Lung, MD [place],  [place] [address] PCP: Lisa Lung, MD   Assessment & Plan: Visit Diagnoses:  1. History of hip replacement, total, right   2. Status post total hip replacement, right     Plan: Lisa Hutchinson is now 1 year status post right total hip replacement.  She is doing well and very happy.  Dental prophylaxis reinforced.  Recheck in another year with standing AP pelvis x-rays.  Follow-Up Instructions: Return in about 1 year (around 07/08/2023).   Orders:  Orders Placed This Encounter  Procedures   XR Pelvis 1-2 Views   No orders of the defined types were placed in this encounter.     Procedures: No procedures performed   Clinical Data: No additional findings.   Subjective: Chief Complaint  Patient presents with   Right Hip - Routine Post Op    HPI  Patient is 1 year s/p right THA.  Doing well overall.  Very happy.  Review of Systems   Objective: Vital Signs: There were no vitals taken for this visit.  Physical Exam  Ortho Exam  Right hip exam is unremarkable.  Fully healed surgical scar.  Specialty Comments:  No specialty comments available.  Imaging: XR Pelvis 1-2 Views  Result Date: 07/08/2022 Stable total hip replacement without complications    PMFS History: Patient Active Problem List   Diagnosis Date Noted   OSA (obstructive sleep apnea) 06/08/2022   History of cataract extraction 06/02/2022   Status post total hip replacement, right 07/14/2021   Primary osteoarthritis of right hip 07/13/2021   Urinary frequency 05/16/2021   Glaucoma 12/03/2020   ACE-inhibitor cough 11/30/2019   Osteopenia 12/20/2017   Arthritis 07/15/2015   Obesity (BMI 30-39.9) 07/15/2015   Atrophic vaginitis 08/10/2013   Hypertension 05/27/2011   Family history of  colonic polyps 10/22/2010   Hx of adenomatous colonic polyps 10/22/2010   Past Medical History:  Diagnosis Date   Anemia    with pregnancy    Arthritis    knees   Atrophic vaginitis    Cataract    bilateral   Edema    H/O mammogram 05/03/12   Hyperlipidemia    no medicines    Hypertension    Hypokalemia    OSA (obstructive sleep apnea) 06/08/2022   Sleep difficulties    sleep study normal, fall 2013    Family History  Problem Relation Age of Onset   Colon polyps Mother 11       adenomatous polyps,ascending mass   Hypertension Mother    Diabetes Mother    Hypertension Father    Diabetes Father    Diabetes Sister    Diabetes Brother    Heart disease Neg Hx    Hyperlipidemia Neg Hx    Colon cancer Neg Hx    Esophageal cancer Neg Hx    Rectal cancer Neg Hx    Stomach cancer Neg Hx     Past Surgical History:  Procedure Laterality Date   ABDOMINAL HYSTERECTOMY     fribroids   BREAST LUMPECTOMY     benign   COLONOSCOPY  09/2010; 2007   tubular adenoma, Dr. Verl Blalock  POLYPECTOMY     TOTAL HIP ARTHROPLASTY Right 07/14/2021   Procedure: RIGHT TOTAL HIP ARTHROPLASTY ANTERIOR APPROACH;  Surgeon: Leandrew Koyanagi, MD;  Location: [place];  Service: Orthopedics;  Laterality: Right;  3-C   Social History   Occupational History   Not on file  Tobacco Use   Smoking status: Former    Types: Cigarettes    Quit date: 1994    Years since quitting: 30.2   Smokeless tobacco: Never  Vaping Use   Vaping Use: Never used  Substance and Sexual Activity   Alcohol use: No    Alcohol/week: 0.0 standard drinks of alcohol   Drug use: No   Sexual activity: Not Currently

## 2022-07-15 ENCOUNTER — Encounter: Payer: Self-pay | Admitting: Family Medicine

## 2022-07-15 ENCOUNTER — Ambulatory Visit (INDEPENDENT_AMBULATORY_CARE_PROVIDER_SITE_OTHER): Payer: Medicare Other | Admitting: Family Medicine

## 2022-07-15 VITALS — BP 138/86 | HR 76 | Temp 97.8°F | Resp 14 | Wt 176.0 lb

## 2022-07-15 DIAGNOSIS — G4733 Obstructive sleep apnea (adult) (pediatric): Secondary | ICD-10-CM

## 2022-07-15 NOTE — Progress Notes (Signed)
   Subjective:    Patient ID: Lisa Hutchinson, female    DOB: 01-22-1951, 72 y.o.   MRN: [id number]  HPI She is here for consult concerning recent sleep study which did show OSA with an AHI of 19.7.   Review of Systems     Objective:   Physical Exam Alert and in no distress otherwise not examined       Assessment & Plan:  OSA (obstructive sleep apnea) I discussed the diagnosis of OSA and relationship to blood pressure, diabetes, heart disease, energy and stamina.  I will set her up for a CPAP.  Explained the fact that we will need to then get a reading approximately 1 month after she starts to use this to determine efficacy and also to find out any benefit she might obtain from this.  Discussed sleep hygiene with her.

## 2022-07-30 ENCOUNTER — Ambulatory Visit: Payer: Medicare Other | Admitting: Family Medicine

## 2022-08-06 DIAGNOSIS — H1033 Unspecified acute conjunctivitis, bilateral: Secondary | ICD-10-CM | POA: Diagnosis not present

## 2022-08-24 ENCOUNTER — Other Ambulatory Visit: Payer: Self-pay | Admitting: Family Medicine

## 2022-08-24 DIAGNOSIS — Z1231 Encounter for screening mammogram for malignant neoplasm of breast: Secondary | ICD-10-CM

## 2022-09-30 ENCOUNTER — Encounter: Payer: Self-pay | Admitting: Family Medicine

## 2022-09-30 ENCOUNTER — Ambulatory Visit (INDEPENDENT_AMBULATORY_CARE_PROVIDER_SITE_OTHER): Payer: Medicare Other | Admitting: Family Medicine

## 2022-09-30 VITALS — BP 122/88 | HR 75 | Temp 97.8°F | Resp 14 | Wt 183.2 lb

## 2022-09-30 DIAGNOSIS — G4733 Obstructive sleep apnea (adult) (pediatric): Secondary | ICD-10-CM

## 2022-09-30 NOTE — Progress Notes (Signed)
   Subjective:    Patient ID: Lisa Hutchinson, female    DOB: 1950-08-01, 72 y.o.   MRN: [id number]  HPI She is here for follow-up visit to discuss CPAP.  She did have a sleep study which did show an AHI of 19.7.  She has tried various devices and found that none of them gave her a good fit and interfered with her sleep.  Review of the record indicates that she did use it on average 6 hours and 40 minutes and apparently it did help with her underlying apnea index of being 0.9 however she cannot really tolerate this.   Review of Systems     Objective:   Physical Exam Alert and in no distress otherwise not examined       Assessment & Plan:  OSA (obstructive sleep apnea) Since she is intolerant of but I recommend she no longer use this.  I will recommend that she send the device back to the company.  She thinks that her dentist does dental appliances.  I have asked her to send me the fax number for the dentist and I can send the sleep study results to her dentist.

## 2022-10-01 ENCOUNTER — Encounter: Payer: Self-pay | Admitting: Family Medicine

## 2022-10-08 ENCOUNTER — Ambulatory Visit
Admission: RE | Admit: 2022-10-08 | Discharge: 2022-10-08 | Disposition: A | Discharge status: Home / Self Care | Payer: Medicare Other | Source: Ambulatory Visit | Attending: Family Medicine | Admitting: Family Medicine

## 2022-10-08 DIAGNOSIS — Z1231 Encounter for screening mammogram for malignant neoplasm of breast: Secondary | ICD-10-CM

## 2022-11-13 DIAGNOSIS — M792 Neuralgia and neuritis, unspecified: Secondary | ICD-10-CM | POA: Diagnosis not present

## 2022-11-13 DIAGNOSIS — M2012 Hallux valgus (acquired), left foot: Secondary | ICD-10-CM | POA: Diagnosis not present

## 2022-11-13 DIAGNOSIS — L84 Corns and callosities: Secondary | ICD-10-CM | POA: Diagnosis not present

## 2022-11-13 DIAGNOSIS — M19071 Primary osteoarthritis, right ankle and foot: Secondary | ICD-10-CM | POA: Diagnosis not present

## 2022-11-13 DIAGNOSIS — M2011 Hallux valgus (acquired), right foot: Secondary | ICD-10-CM | POA: Diagnosis not present

## 2022-11-24 ENCOUNTER — Encounter: Payer: Self-pay | Admitting: Family Medicine

## 2022-11-24 ENCOUNTER — Telehealth (INDEPENDENT_AMBULATORY_CARE_PROVIDER_SITE_OTHER): Payer: Medicare Other | Admitting: Family Medicine

## 2022-11-24 VITALS — Wt 183.0 lb

## 2022-11-24 DIAGNOSIS — J069 Acute upper respiratory infection, unspecified: Secondary | ICD-10-CM | POA: Diagnosis not present

## 2022-11-24 NOTE — Progress Notes (Signed)
   Subjective:    Patient ID: Lisa Hutchinson, female    DOB: 1951/01/31, 72 y.o.   MRN: [id number]  HPI Documentation for virtual audio and video telecommunications through Caregility encounter: The patient was located at home. 2 patient identifiers used.  The provider was located in the office. The patient did consent to this visit and is aware of possible charges through their insurance for this visit. The other persons participating in this telemedicine service were none. Time spent on call was 5 minutes and in review of previous records >15 minutes total for counseling and coordination of care. This virtual service is not related to other E/M service within previous 7 days.  She has a 2-day history that started with sore throat, dry cough, headache, rhinorrhea and fatigue.  The rhinorrhea is now slightly purulent.  She did do COVID testing and this was negative.  No fever, chills, earache.  She has been using Alka-Seltzer plus and NyQuil at night.  Review of Systems     Objective:    Physical Exam Alert and in no distress.  Her breathing pattern visually appears normal.       Assessment & Plan:  Viral URI with cough I explained that this is a viral type of an infection and continue with Alka-Seltzer plus, NyQuil and also use Zyrtec.  Tylenol for the aches and pains.  If she gets worse, she is instructed to call back.  She was comfortable with that.

## 2022-12-02 DIAGNOSIS — H401133 Primary open-angle glaucoma, bilateral, severe stage: Secondary | ICD-10-CM | POA: Diagnosis not present

## 2022-12-07 ENCOUNTER — Ambulatory Visit: Payer: Medicare Other | Admitting: Family Medicine

## 2022-12-10 ENCOUNTER — Encounter: Payer: Self-pay | Admitting: Family Medicine

## 2022-12-10 ENCOUNTER — Ambulatory Visit (INDEPENDENT_AMBULATORY_CARE_PROVIDER_SITE_OTHER): Payer: Medicare Other | Admitting: Family Medicine

## 2022-12-10 VITALS — BP 126/88 | HR 83 | Temp 97.8°F | Resp 14 | Ht 63.0 in | Wt 180.0 lb

## 2022-12-10 DIAGNOSIS — Z83719 Family history of colon polyps, unspecified: Secondary | ICD-10-CM | POA: Diagnosis not present

## 2022-12-10 DIAGNOSIS — I10 Essential (primary) hypertension: Secondary | ICD-10-CM | POA: Diagnosis not present

## 2022-12-10 DIAGNOSIS — E2839 Other primary ovarian failure: Secondary | ICD-10-CM

## 2022-12-10 DIAGNOSIS — M8580 Other specified disorders of bone density and structure, unspecified site: Secondary | ICD-10-CM | POA: Diagnosis not present

## 2022-12-10 DIAGNOSIS — Z9849 Cataract extraction status, unspecified eye: Secondary | ICD-10-CM | POA: Diagnosis not present

## 2022-12-10 DIAGNOSIS — G4733 Obstructive sleep apnea (adult) (pediatric): Secondary | ICD-10-CM | POA: Diagnosis not present

## 2022-12-10 DIAGNOSIS — M1990 Unspecified osteoarthritis, unspecified site: Secondary | ICD-10-CM | POA: Diagnosis not present

## 2022-12-10 DIAGNOSIS — E669 Obesity, unspecified: Secondary | ICD-10-CM | POA: Diagnosis not present

## 2022-12-10 DIAGNOSIS — Z86010 Personal history of colonic polyps: Secondary | ICD-10-CM

## 2022-12-10 DIAGNOSIS — H4010X0 Unspecified open-angle glaucoma, stage unspecified: Secondary | ICD-10-CM

## 2022-12-10 DIAGNOSIS — Z0000 Encounter for general adult medical examination without abnormal findings: Secondary | ICD-10-CM

## 2022-12-10 DIAGNOSIS — R7303 Prediabetes: Secondary | ICD-10-CM | POA: Diagnosis not present

## 2022-12-10 LAB — POCT GLYCOSYLATED HEMOGLOBIN (HGB A1C): Hemoglobin A1C: 5.7 % — AB (ref 4.0–5.6)

## 2022-12-10 MED ORDER — LOSARTAN POTASSIUM-HCTZ 50-12.5 MG PO TABS: 1.0000 | ORAL_TABLET | Freq: Every day | ORAL | 3 refills | Status: DC

## 2022-12-10 NOTE — Progress Notes (Signed)
Lisa Hutchinson is a 72 y.o. female who presents for annual wellness visit and follow-up on chronic medical conditions.  She has no particular concerns or complaints.  She has had her immunizations.  She gets regular colonoscopies due to family history.  She is using drops for her glaucoma.  She does have OSA but is not tolerating the CPAP and is trying to get dental appliance.  She has been taking extra vitamin D.  She follows up regularly with ophthalmology.  She does have a history of prediabetes.  She does exercise regularly.  She continues on losartan/HCTZ.  Immunizations and Health Maintenance Immunization History  Administered Date(s) Administered   COVID-19, mRNA, vaccine(Comirnaty)12 years and older 02/11/2022   Fluad Quad(high Dose 65+) 05/23/2019, 01/25/2020, 01/27/2021, 01/20/2022   Influenza, High Dose Seasonal PF 06/22/2018   Influenza,inj,Quad PF,6+ Mos 02/21/2013, 01/24/2014   PFIZER(Purple Top)SARS-COV-2 Vaccination 07/01/2019, 07/29/2019, 02/05/2020   Pfizer Covid-19 Vaccine Bivalent Booster 55yrs & up 01/27/2021   Pneumococcal Conjugate-13 12/20/2017   Pneumococcal Polysaccharide-23 05/23/2019   Tdap 07/18/2012   Zoster Recombinant(Shingrix) 07/15/2016, 09/28/2016   Zoster, Live 09/23/2012   Health Maintenance Due  Topic Date Due   COVID-19 Vaccine (6 - 2023-24 season) 04/08/2022   DTaP/Tdap/Td (2 - Td or Tdap) 07/19/2022   Medicare Annual Wellness (AWV)  12/05/2022   INFLUENZA VACCINE  11/26/2022    Last Pap smear: n/a Last mammogram: 10/08/2022 Last colonoscopy: 2020 Last DEXA: 2021 Dentist: within the last year Ophtho: within the last year Exercise: daily walking 45 minutes  Other doctors caring for patient include: Orthopedic-- [facility]  Advanced directives:  Yes- not on file  Depression screen:  See questionnaire below.     12/10/2022    1:28 PM 12/04/2021    8:47 AM 12/03/2020    1:50 PM 11/30/2019    8:18 AM 12/20/2017    8:28 AM  Depression  screen PHQ 2/9  Decreased Interest 0 0 0 0 0  Down, Depressed, Hopeless 0 0 0 0 0  PHQ - 2 Score 0 0 0 0 0    Fall Risk Screen: see questionnaire below.    12/10/2022    1:28 PM 06/02/2022    9:11 AM 12/04/2021    8:45 AM 12/03/2020    1:50 PM 11/30/2019    8:17 AM  Fall Risk   Falls in the past year? 0 0 0 0 0  Number falls in past yr: 0 0 0 0   Injury with Fall? 0 0 0 0   Risk for fall due to : No Fall Risks No Fall Risks No Fall Risks No Fall Risks No Fall Risks  Follow up Falls evaluation completed Falls evaluation completed Falls evaluation completed Falls evaluation completed     ADL screen:  See questionnaire below Functional Status Survey: Is the patient deaf or have difficulty hearing?: No Does the patient have difficulty seeing, even when wearing glasses/contacts?: No Does the patient have difficulty concentrating, remembering, or making decisions?: No Does the patient have difficulty walking or climbing stairs?: No Does the patient have difficulty dressing or bathing?: No Does the patient have difficulty doing errands alone such as visiting a doctor's office or shopping?: No   Review of Systems Constitutional: -, -unexpected weight change, -anorexia, -fatigue Allergy: -sneezing, -itching, -congestion Dermatology: denies changing moles, rash, lumps ENT: -runny nose, -ear pain, -sore throat,  Cardiology:  -chest pain, -palpitations, -orthopnea, Respiratory: -cough, -shortness of breath, -dyspnea on exertion, -wheezing,  Gastroenterology: -abdominal pain, -nausea, -vomiting, -  diarrhea, -constipation, -dysphagia Hematology: -bleeding or bruising problems Musculoskeletal: -arthralgias, -myalgias, -joint swelling, -back pain, - Ophthalmology: -vision changes,  Urology: -dysuria, -difficulty urinating,  -urinary frequency, -urgency, incontinence Neurology: -, -numbness, , -memory loss, -falls, -dizziness    PHYSICAL EXAM:  BP 126/88   Pulse 83   Temp 97.8 F (36.6 C)  (Oral)   Resp 14   Ht 5\' 3"  (1.6 m)   Wt 180 lb (81.6 kg)   SpO2 95% Comment: room air  BMI 31.89 kg/m   General Appearance: Alert, cooperative, no distress, appears stated age Head: Normocephalic, without obvious abnormality, atraumatic Eyes: PERRL, conjunctiva/corneas clear, EOM's intact,  Ears: Normal TM's and external ear canals Nose: Nares normal, mucosa normal, no drainage or sinus tenderness Throat: Lips, mucosa, and tongue normal; teeth and gums normal Neck: Supple, no lymphadenopathy;  thyroid:  no enlargement/tenderness/nodules; no carotid bruit or JVD Lungs: Clear to auscultation bilaterally without wheezes, rales or ronchi; respirations unlabored Heart: Regular rate and rhythm, S1 and S2 normal, no murmur, rubor gallop Abdomen: Soft, non-tender, nondistended, normoactive bowel sounds,  no masses, no hepatosplenomegaly Skin:  Skin color, texture, turgor normal, no rashes or lesions Lymph nodes: Cervical, supraclavicular, and axillary nodes normal Neurologic:  CNII-XII intact, normal strength, sensation and gait; reflexes 2+ and symmetric throughout Psych: Normal mood, affect, hygiene and grooming.  ASSESSMENT/PLAN: Arthritis  Family history of colonic polyps  Open-angle glaucoma, unspecified glaucoma stage, unspecified laterality, unspecified open-angle glaucoma type  History of cataract extraction, unspecified laterality  Hx of adenomatous colonic polyps  Primary hypertension  Obesity (BMI 30-39.9)  OSA (obstructive sleep apnea)  Osteopenia, unspecified location - Plan: DG Bone Density  Pre-diabetes - Plan: POCT glycosylated hemoglobin (Hb A1C)  Encounter for annual wellness exam in Medicare patient  Essential hypertension - Plan: losartan-hydrochlorothiazide (HYZAAR) 50-12.5 MG tablet  Estrogen deficiency - Plan: DG Bone Density    Discussed  at least 30 minutes of aerobic activity at least 5 days/week and weight-bearing exercise 2x/week; healthy  diet, including goals of calcium and vitamin D intake    Immunization recommendations discussed.  Colonoscopy recommendations reviewed   Medicare Attestation I have personally reviewed: The patient's medical and social history Their use of alcohol, tobacco or illicit drugs Their current medications and supplements The patient's functional ability including ADLs,fall risks, home safety risks, cognitive, and hearing and visual impairment Diet and physical activities Evidence for depression or mood disorders  The patient's weight, height, and BMI have been recorded in the chart.  I have made referrals, counseling, and provided education to the patient based on review of the above and I have provided the patient with a written personalized care plan for preventive services.     Sharlot Gowda, MD   12/10/2022

## 2023-01-07 ENCOUNTER — Ambulatory Visit (INDEPENDENT_AMBULATORY_CARE_PROVIDER_SITE_OTHER): Payer: Medicare Other | Admitting: Family Medicine

## 2023-01-07 ENCOUNTER — Encounter: Payer: Self-pay | Admitting: Family Medicine

## 2023-01-07 VITALS — BP 130/86 | HR 77 | Wt 180.4 lb

## 2023-01-07 DIAGNOSIS — M6740 Ganglion, unspecified site: Secondary | ICD-10-CM | POA: Diagnosis not present

## 2023-01-07 DIAGNOSIS — Z23 Encounter for immunization: Secondary | ICD-10-CM

## 2023-01-07 NOTE — Progress Notes (Signed)
   Subjective:    Patient ID: Lisa Hutchinson, female    DOB: 10/15/1950, 72 y.o.   MRN: [id number]  HPI Here for evaluation of a lesion in the left hand palmar surface third finger.  It is not causing any discomfort but she notes a lump there.  Review of Systems     Objective:    Physical Exam Exam of the left hand third finger does show a 1 cm round smooth movable lesion.  It does move with the tendon.       Assessment & Plan:  Ganglion  Need for COVID-19 vaccine - Plan: Pfizer Comirnaty Covid -19 Vaccine 64yrs and older  Need for influenza vaccination - Plan: Flu Vaccine Trivalent High Dose (Fluad) I reassured her that she had a benign lesion in unless it starts causing trouble with gripping things, no further intervention is needed.  She was comfortable with

## 2023-01-15 ENCOUNTER — Encounter: Payer: Medicare Other | Attending: Family Medicine | Admitting: Dietician

## 2023-01-15 ENCOUNTER — Encounter: Payer: Self-pay | Admitting: Dietician

## 2023-01-15 DIAGNOSIS — R7303 Prediabetes: Secondary | ICD-10-CM | POA: Insufficient documentation

## 2023-01-15 NOTE — Progress Notes (Signed)
Patient was seen on 01/15/2023 for the Core Session 1 of Diabetes Prevention Program course at [facility]. The following learning objectives were met by the patient during this class:   Learning Objectives:  Be able to explain the purpose and benefits of the National Diabetes Prevention Program.  Be able to describe the events that will take place at every session.  Know the weight loss and physical activity goals established by the [facility] Diabetes Prevention Program.  Know their own individual weight loss and physical activity goals.  Be able to explain the important effect of self-monitoring on behavior change.   Goals:  Record food and beverage intake in "Food and Activity Tracker" over the next week.  Bring completed "Food and Activity Tracker" for session 1 to session 2 next week. Circle the foods or beverages you think are highest in fat and calories in your food tracker. Read the labels on the food you buy, and consider using measuring cups and spoons to help you calculate the amount you eat. We will talk about measuring in more detail in the coming weeks.   Follow-Up Plan: Attend Core Session 2 next week.  Bring completed "Food and Activity Tracker" next week to be reviewed by Lifestyle Coach.

## 2023-01-25 ENCOUNTER — Encounter: Payer: Medicare Other | Admitting: Dietician

## 2023-01-29 ENCOUNTER — Encounter: Payer: Medicare Other | Attending: Family Medicine | Admitting: Dietician

## 2023-01-29 ENCOUNTER — Encounter: Payer: Self-pay | Admitting: Dietician

## 2023-01-29 DIAGNOSIS — R7303 Prediabetes: Secondary | ICD-10-CM | POA: Insufficient documentation

## 2023-01-29 NOTE — Progress Notes (Signed)
Patient was seen on 01/29/2023 for the Core Session 2 of Diabetes Prevention Program course at [facility]. By the end of this session patients are able to complete the following objectives:   Learning Objectives: Self-monitor their weight during the weeks following Session 2.  Describe the relationship between fat and calories.  Explain the reason for, and basic principles of, self-monitoring fat grams and calories.  Identify their personal fat gram goals.  Use the ?Fat and Calorie Counter? to calculate the calories and fat grams of a given selection of foods.  Keep a running total of the fat grams they eat each day.  Calculate fat, calories, and serving sizes from nutrition labels.   Goals:  Weigh yourself at the same time each day, or every few days, and record your weight in your Food and Activity Tracker. Write down everything you eat and drink in your Food and Activity Tracker. Measure portions as much as you can, and start reading labels.  Use the ?Fat and Calorie Counter? to figure out the amount of fat and calories in what you ate, and write the amount down in your Food and Activity Tracker. Keep a running fat gram total throughout the day. Come as close to your fat gram goal as you can.   Follow-Up Plan: Attend Core Session 3 next week.  Bring completed "Food and Activity Tracker" next week to be reviewed by Lifestyle Coach.

## 2023-02-05 ENCOUNTER — Encounter: Payer: Medicare Other | Admitting: Dietician

## 2023-02-05 ENCOUNTER — Encounter: Payer: Self-pay | Admitting: Dietician

## 2023-02-05 DIAGNOSIS — R7303 Prediabetes: Secondary | ICD-10-CM

## 2023-02-05 NOTE — Progress Notes (Signed)
Patient was seen on 02/05/2023 for the Core Session 3 of Diabetes Prevention Program course at [facility]. By the end of this session patients are able to complete the following objectives:   Learning Objectives: Weigh and measure foods. Estimate the fat and calorie content of common foods. Describe three ways to eat less fat and fewer calories. Create a plan to eat less fat for the following week.   Goals:  Track weight when weighing outside of class.  Track food and beverages eaten each day in Food and Activity Tracker and include fat grams and calories for each.  Try to stay within fat gram goal.  Complete plan for eating less high fat foods and answer related homework questions.    Follow-Up Plan: Attend Core Session 4 next week.  Bring completed "Food and Activity Tracker" next week to be reviewed by Lifestyle Coach.

## 2023-02-12 ENCOUNTER — Encounter: Payer: Self-pay | Admitting: Dietician

## 2023-02-12 ENCOUNTER — Encounter (HOSPITAL_BASED_OUTPATIENT_CLINIC_OR_DEPARTMENT_OTHER): Payer: Medicare Other | Admitting: Dietician

## 2023-02-12 VITALS — Wt 176.0 lb

## 2023-02-12 DIAGNOSIS — R7303 Prediabetes: Secondary | ICD-10-CM | POA: Diagnosis not present

## 2023-02-12 NOTE — Progress Notes (Signed)
Patient was seen on 02/12/23 for the Core Session 4 of Diabetes Prevention Program course at [facility]. By the end of this session patients are able to complete the following objectives:   Learning Objectives: Explain the health benefits of eating less fat and fewer calories. Describe the MyPlate food guide and its recommendations, including how to reduce fat and calories in our diet. Compare and contrast MyPlate guidelines with participants' eating habits. List ways to replace high-fat and high-calorie foods with low-fat and low-calorie foods. Explain the importance of eating plenty of whole grains, vegetables, and fruits, while staying within fat gram goals. Explain the importance of eating foods from all groups of MyPlate and of eating a variety of foods from within each group. Explain why a balanced diet is beneficial to health. Explain why eating the same foods over and over is not the best strategy for long-term success.   Goals:  Record weight taken outside of class.  Track foods and beverages eaten each day in the "Food and Activity Tracker," including calories and fat grams for each item.  Practice comparing what you eat with the recommendations of MyPlate using the "Rate Your Plate" handout.  Complete the "Rate Your Plate" handout form on at least 3 days.  Answer homework questions.   Follow-Up Plan: Attend Core Session 5 next week.  Bring completed "Food and Activity Tracker" next week to be reviewed by Lifestyle Coach.

## 2023-02-19 ENCOUNTER — Encounter (HOSPITAL_BASED_OUTPATIENT_CLINIC_OR_DEPARTMENT_OTHER): Payer: Medicare Other | Admitting: Dietician

## 2023-02-19 ENCOUNTER — Encounter: Payer: Self-pay | Admitting: Dietician

## 2023-02-19 DIAGNOSIS — R7303 Prediabetes: Secondary | ICD-10-CM

## 2023-02-19 NOTE — Progress Notes (Signed)
Patient was seen on 02/19/2023 for the Core Session 5 of Diabetes Prevention Program course at [facility]. By the end of this session patients are able to complete the following objectives:   Learning Objectives: Establish a physical activity goal. Explain the importance of the physical activity goal. Describe their current level of physical activity. Name ways that they are already physically active. Develop personal plans for physical activity for the next week.   Goals:  Record weight taken outside of class.  Track foods and beverages eaten each day in the "Food and Activity Tracker," including calories and fat grams for each item.  Make an Activity Plan including date, specific type of activity, and length of time you plan to be active that includes at last 60 minutes of activity for the week.  Track activity type, minutes you were active, and distance you reached each day in the "Food and Activity Tracker."   Follow-Up Plan: Attend Core Session 6 next week.  Bring completed "Food and Activity Tracker" next week to be reviewed by Lifestyle Coach.

## 2023-02-26 ENCOUNTER — Encounter: Payer: Self-pay | Admitting: Dietician

## 2023-02-26 ENCOUNTER — Ambulatory Visit (HOSPITAL_BASED_OUTPATIENT_CLINIC_OR_DEPARTMENT_OTHER): Payer: Medicare Other | Admitting: Dietician

## 2023-02-26 DIAGNOSIS — R7303 Prediabetes: Secondary | ICD-10-CM

## 2023-02-26 NOTE — Progress Notes (Signed)
Patient was seen on 02/26/2023 for the Core Session 6 of Diabetes Prevention Program course at [facility]. By the end of this session patients are able to complete the following objectives:   Learning Objectives: Graph their daily physical activity.  Describe two ways of finding the time to be active.  Define "lifestyle activity."  Describe how to prevent injury.  Develop an activity plan for the coming week.   Goals:  Record weight taken outside of class.  Track foods and beverages eaten each day in the "Food and Activity Tracker," including calories and fat grams for each item.   Track activity type, minutes you were active, and distance you reached each day in the "Food and Activity Tracker."  Set aside one 20 to 30-minute block of time every day or find two or more periods of 10 to15 minutes each for physical activity.  Warm up, cool down, and stretch. Make a Physical Activities Plan for the Week.   Follow-Up Plan: Attend Core Session 7 next week.  Bring completed "Food and Activity Tracker" next week to be reviewed by Lifestyle Coach.

## 2023-03-01 ENCOUNTER — Ambulatory Visit (INDEPENDENT_AMBULATORY_CARE_PROVIDER_SITE_OTHER): Payer: Medicare Other | Admitting: Family Medicine

## 2023-03-01 ENCOUNTER — Encounter: Payer: Self-pay | Admitting: Family Medicine

## 2023-03-01 VITALS — BP 132/80 | HR 72 | Temp 97.0°F | Ht 64.0 in | Wt 174.6 lb

## 2023-03-01 DIAGNOSIS — H6122 Impacted cerumen, left ear: Secondary | ICD-10-CM

## 2023-03-01 DIAGNOSIS — H6123 Impacted cerumen, bilateral: Secondary | ICD-10-CM

## 2023-03-01 DIAGNOSIS — I10 Essential (primary) hypertension: Secondary | ICD-10-CM

## 2023-03-01 DIAGNOSIS — H6121 Impacted cerumen, right ear: Secondary | ICD-10-CM | POA: Diagnosis not present

## 2023-03-01 DIAGNOSIS — J309 Allergic rhinitis, unspecified: Secondary | ICD-10-CM

## 2023-03-01 MED ORDER — AZELASTINE HCL 0.1 % NA SOLN: 2.0000 | Freq: Two times a day (BID) | NASAL | 12 refills | Status: DC

## 2023-03-01 NOTE — Progress Notes (Signed)
Chief Complaint  Patient presents with   Ear Pain    Left ear pain and clogged. Has some congestion, did a covid test this weekend and it was negative. Has had the congestion for at least a month and a half. Yellow/green phlegm when she coughs. Has bee taking OTC products for 3 weeks, wonders if she needs an antibiotic. Also has a form for bp cuff size.     She walks every morning.  End of August it started raining while she was walking, and she started to get sick. She had a virtual visit with Dr. Susann Givens, but states that her congestion has persisted.  Review of chart shows virtual visit was 7/30.  She currently feels like her left ear is plugged. She has some ongoing congestion. Denies discolored mucus, or fevers. She has itchy ears (mostly on the left, only occasionally on th R) No sniffling, sneezing, or runny nose Denies sore throat Only has occasional cough in the morning, and gets up some phlegm that can be discolored.  She tried Zyrtec, didn't help. Took it for 2 weeks. She also tried Coricidin HBP, which didn't help. Then switched to loratidine a week or so ago, took it just for a couple of days. [employer] nurse told her to try sudafed. That also didn't help   She brought in form for a new BP monitor, can get one every 2 years through her insurance. Mailed a form in 05/2022 but states they never got it. She reports she still has 2 other BP monitors at home that seem to work.    PMH, PSH, SH reviewed  Outpatient Encounter Medications as of 03/01/2023  Medication Sig Note   Calcium-Vitamin D (CALTRATE 600 PLUS-VIT D PO) Take 1 tablet by mouth daily.    cholecalciferol (VITAMIN D3) 25 MCG (1000 UNIT) tablet Take 1,000 Units by mouth daily.    Cyanocobalamin (VITAMIN B 12 PO) Take 1,000 mg by mouth daily.    latanoprost (XALATAN) 0.005 % ophthalmic solution Place 1 drop into both eyes at bedtime.    losartan-hydrochlorothiazide (HYZAAR) 50-12.5 MG tablet Take 1 tablet by mouth daily.     Multiple Vitamins-Minerals (MULTIVITAMIN WITH MINERALS) tablet Take 1 tablet by mouth daily.    pseudoephedrine (SUDAFED) 120 MG 12 hr tablet Take 120 mg by mouth 2 (two) times daily. 03/01/2023: Last dose 9am yesterday   psyllium (METAMUCIL SMOOTH TEXTURE) 28 % packet Take 1 packet by mouth 2 (two) times daily.    cetirizine (ZYRTEC) 10 MG tablet Take 10 mg by mouth daily. (Patient not taking: Reported on 03/01/2023) 03/01/2023: Stopped last Monday   clindamycin (CLEOCIN) 300 MG capsule Take 2 pills one hour prior to dental work (Patient not taking: Reported on 01/07/2023)    No facility-administered encounter medications on file as of 03/01/2023.   Allergies  Allergen Reactions   Lisinopril     cough   Penicillins Hives   Sulfonamide Derivatives Hives    ROS: no f/c.  Congestion and URI complaints per HPI.  No n/v/d, no chest pain or shortness of breath. No edema. No other concerns except as noted in HPI    PHYSICAL EXAM:  BP 132/80   Pulse 72   Temp (!) 97 F (36.1 C)   Ht 5\' 4"  (1.626 m)   Wt 174 lb 9.6 oz (79.2 kg)   BMI 29.97 kg/m   Pleasant, well-appearing female in no distress HEENT: conjunctiva and sclera are clear, EOMI.  TM's--obscured by cerumen bilaterally.  After  bilateral ear lavage, TM's and EACs were normal bilaterally. Nasal mucosa with mod-severe edema, clear.  No mucus noted OP is clear. Sinuses nontender Neck: no lymphadenopathy or mass Heart: regular rate and rhythm Lungs: clear bilaterally Extremities: no edema     ASSESSMENT/PLAN:  Bilateral impacted cerumen - resolved with bilateral ear lavage. Pt's ear symptoms improved significantly.  Allergic rhinitis, unspecified seasonality, unspecified trigger - has glaucoma, so avoid nasal steroids. Trial of astelin to decrease PND/am cough.  Oral antihistamines not helpful (for ear, which was d/t wax)  Essential hypertension - advised to avoid decongestant use due to HTN  FFO for BP monitor. Discussed  whether or not she truly needs to get this, if she has other BP monitors at home that are still working well.   Please avoid taking decongestants such as Sudafed. This can raise your blood pressure. (If you have to take it, be sure that you are monitoring your blood pressure and that it stays <140/90).  Try using the Astelin nasal spray to see if this helps with your allergies.  I suspect that your cough is related to postnasal drainage from allergies. I do not see any evidence of infection. You can also use guaifenesin (found in Robitussin and Mucinex) to help with the cough (loosens up the phlegm).

## 2023-03-01 NOTE — Patient Instructions (Signed)
Please avoid taking decongestants such as Sudafed. This can raise your blood pressure. (If you have to take it, be sure that you are monitoring your blood pressure and that it stays <140/90).  Try using the Astelin nasal spray to see if this helps with your allergies.  I suspect that your cough is related to postnasal drainage from allergies. I do not see any evidence of infection. You can also use guaifenesin (found in Robitussin and Mucinex) to help with the cough (loosens up the phlegm).

## 2023-03-05 ENCOUNTER — Encounter: Payer: Medicare Other | Attending: Family Medicine | Admitting: Dietician

## 2023-03-05 ENCOUNTER — Encounter: Payer: Self-pay | Admitting: Dietician

## 2023-03-05 DIAGNOSIS — R7303 Prediabetes: Secondary | ICD-10-CM | POA: Insufficient documentation

## 2023-03-05 NOTE — Progress Notes (Signed)
Patient was seen on 03/05/2023 for the Core Session 7 of Diabetes Prevention Program course at [facility]. By the end of this session patients are able to complete the following objectives:   Learning Objectives: Define calorie balance. Explain how healthy eating and being active are related in terms of calorie balance.  Describe the relationship between calorie balance and weight loss.  Describe his or her progress as it relates to calorie balance.  Develop an activity plan for the coming week.   Goals:  Record weight taken outside of class.  Track foods and beverages eaten each day in the "Food and Activity Tracker," including calories and fat grams for each item.   Track activity type, minutes you were active, and distance you reached each day in the "Food and Activity Tracker."  Set aside one 20 to 30-minute block of time every day or find two or more periods of 10 to15 minutes each for physical activity.  Make a Physical Activities Plan for the Week.  Make active lifestyle choices all through the day  Stay at or go slightly over activity goal.   Follow-Up Plan: Attend Core Session 8 next week.  Bring completed "Food and Activity Tracker" next week to be reviewed by Lifestyle Coach.

## 2023-03-12 ENCOUNTER — Encounter (HOSPITAL_BASED_OUTPATIENT_CLINIC_OR_DEPARTMENT_OTHER): Payer: Medicare Other | Admitting: Dietician

## 2023-03-12 ENCOUNTER — Encounter: Payer: Self-pay | Admitting: Dietician

## 2023-03-12 DIAGNOSIS — R7303 Prediabetes: Secondary | ICD-10-CM

## 2023-03-12 NOTE — Progress Notes (Signed)
Patient was seen on 03/12/23 for the Core Session 8 of Diabetes Prevention Program course at [facility]. By the end of this session patients are able to complete the following objectives:   Learning Objectives: Recognize positive and negative food and activity cues.  Change negative food and activity cues to positive cues.  Add positive cues for activity and eliminate cues for inactivity.  Develop a plan for removing one problem food cue for the coming week.   Goals:  Record weight taken outside of class.  Track foods and beverages eaten each day in the "Food and Activity Tracker," including calories and fat grams for each item.   Track activity type, minutes you were active, and distance you reached each day in the "Food and Activity Tracker."  Set aside one 20 to 30-minute block of time every day or find two or more periods of 10 to15 minutes each for physical activity.  Remove one problem food cue.  Add one positive cue for being more active.  Follow-Up Plan: Attend Core Session 9 next week.  Bring completed "Food and Activity Tracker" next week to be reviewed by Lifestyle Coach.

## 2023-03-15 DIAGNOSIS — H401133 Primary open-angle glaucoma, bilateral, severe stage: Secondary | ICD-10-CM | POA: Diagnosis not present

## 2023-03-19 ENCOUNTER — Encounter: Payer: Self-pay | Admitting: Dietician

## 2023-03-19 ENCOUNTER — Encounter: Payer: Medicare Other | Admitting: Dietician

## 2023-03-19 DIAGNOSIS — R7303 Prediabetes: Secondary | ICD-10-CM

## 2023-03-19 NOTE — Progress Notes (Signed)
Patient was seen on 03/19/2023 for the Core Session 9 of Diabetes Prevention Program course at [facility]. By the end of this session patients are able to complete the following objectives:   Learning Objectives: List and describe five steps to problem solving.  Apply the five problem solving steps to resolve a problem he or she has with eating less fat and fewer calories or being more active.   Goals:  Record weight taken outside of class.  Track foods and beverages eaten each day in the "Food and Activity Tracker," including calories and fat grams for each item.   Track activity type, minutes you were active, and distance you reached each day in the "Food and Activity Tracker."  Set aside one 20 to 30-minute block of time every day or find two or more periods of 10 to15 minutes each for physical activity.  Use problem solving action plan created during session to problem solve.   Follow-Up Plan: Attend Core Session 10 next week.  Bring completed "Food and Activity Tracker" next week to be reviewed by Lifestyle Coach. Bring menus from favorite restaurants to next session for future discussion.

## 2023-04-02 ENCOUNTER — Encounter: Payer: Self-pay | Admitting: Dietician

## 2023-04-02 ENCOUNTER — Encounter: Payer: Medicare Other | Attending: Family Medicine | Admitting: Dietician

## 2023-04-02 DIAGNOSIS — R7303 Prediabetes: Secondary | ICD-10-CM | POA: Insufficient documentation

## 2023-04-02 NOTE — Progress Notes (Signed)
Patient was seen on 04/02/2023 for the Core Session 10 of Diabetes Prevention Program course at [facility]. By the end of this session patients are able to complete the following objectives:   Learning Objectives: List and describe the four keys for healthy eating out.  Give examples of how to apply these keys at the type of restaurants that the participants go to regularly.  Make an appropriate meal selection from a restaurant menu.  Demonstrate how to ask for a substitute item using assertive language and a polite tone of voice.    Goals:  Record weight taken outside of class.  Track foods and beverages eaten each day in the "Food and Activity Tracker," including calories and fat grams for each item.   Track activity type, minutes you were active, and distance you reached each day in the "Food and Activity Tracker."  Set aside one 20 to 30-minute block of time every day or find two or more periods of 10 to15 minutes each for physical activity.  Utilize positive action plan and complete questions on "To Do List."   Follow-Up Plan: Attend Core Session 11 next week.  Bring completed "Food and Activity Tracker" next week to be reviewed by Lifestyle Coach.

## 2023-04-09 ENCOUNTER — Encounter: Payer: Self-pay | Admitting: Dietician

## 2023-04-09 ENCOUNTER — Encounter: Payer: Medicare Other | Admitting: Dietician

## 2023-04-09 DIAGNOSIS — R7303 Prediabetes: Secondary | ICD-10-CM | POA: Diagnosis not present

## 2023-04-09 NOTE — Progress Notes (Signed)
On 04/09/2023 completed Session 11 of Diabetes Prevention Program course  with [facility]. By the end of this session patients are able to complete the following objectives:   Learning Objectives: Give examples of negative thoughts that could prevent them from meeting their goals of losing weight and being more physically active.  Describe how to stop negative thoughts and talk back to them with positive thoughts.  Practice 1) stopping negative thoughts and 2) talking back to negative thoughts with positive ones.    Goals:  Record weight taken outside of class.  Track foods and beverages eaten each day in the "Food and Activity Tracker," including calories and fat grams for each item.   Track activity type, minutes you were active, and distance you reached each day in the "Food and Activity Tracker."  If you have any negative thoughts-write them in your Food and Activity Trackers, along with how you talked back to them. Practice stopping negative thoughts and talking back to them with positive thoughts.   Follow-Up Plan: Attend Core Session 12 next week.  Bring completed "Food and Activity Tracker" next week to be reviewed by Lifestyle Coach.

## 2023-04-16 ENCOUNTER — Encounter: Payer: Medicare Other | Admitting: Dietician

## 2023-04-16 ENCOUNTER — Encounter: Payer: Self-pay | Admitting: Dietician

## 2023-04-16 DIAGNOSIS — R7303 Prediabetes: Secondary | ICD-10-CM

## 2023-04-16 NOTE — Progress Notes (Signed)
Patient was seen on 04/16/2023 for the Core Session 12 of Diabetes Prevention Program course at [facility]. By the end of this session patients are able to complete the following objectives:   Learning Objectives: Describe their current progress toward defined goals. Describe common causes for slipping from healthy eating or being active. Explain what to do to get back on their feet after a slip.  Goals:  Record weight taken outside of class.  Track foods and beverages eaten each day in the "Food and Activity Tracker," including calories and fat grams for each item.   Track activity type, minutes active, and distance reached each day in the "Food and Activity Tracker."  Try out the two action plans created during session- "Slips from Healthy Eating: Action Plan" and "Slips from Being Active: Action Plan" Answer questions on the handout.   Follow-Up Plan: Attend Core Session 13 next week.  Bring completed "Food and Activity Tracker" next week to be reviewed by Lifestyle Coach.

## 2023-04-23 ENCOUNTER — Ambulatory Visit (HOSPITAL_BASED_OUTPATIENT_CLINIC_OR_DEPARTMENT_OTHER): Payer: Medicare Other | Admitting: Dietician

## 2023-04-23 ENCOUNTER — Encounter: Payer: Self-pay | Admitting: Dietician

## 2023-04-23 DIAGNOSIS — R7303 Prediabetes: Secondary | ICD-10-CM

## 2023-04-23 NOTE — Progress Notes (Signed)
Patient was seen on 04/23/2023 for the Core Session 13 of Diabetes Prevention Program course at [facility]. By the end of this session patients are able to complete the following objectives:   Learning Objectives: Describe ways to add interest and variety to their activity plans. Define ?aerobic fitness. Explain the four F.I.T.T. principles (frequency, intensity, time, and type of activity) and how they relate to aerobic fitness.   Goals:  Record weight taken outside of class.  Track foods and beverages eaten each day in the "Food and Activity Tracker," including calories and fat grams for each item.   Track activity type, minutes you were active, and distance you reached each day in the "Food and Activity Tracker."  Do your best to reach activity goal for the week. Use one of the F.I.T.T. principles to jump start workouts. Document activity level on the "To Do Next Week" handout.  Follow-Up Plan: Attend Core Session 14 next week.  Bring completed "Food and Activity Tracker" next week to be reviewed by Lifestyle Coach.

## 2023-04-30 ENCOUNTER — Encounter: Payer: Self-pay | Admitting: Dietician

## 2023-04-30 ENCOUNTER — Encounter: Payer: Medicare Other | Attending: Family Medicine | Admitting: Dietician

## 2023-04-30 DIAGNOSIS — R7303 Prediabetes: Secondary | ICD-10-CM | POA: Insufficient documentation

## 2023-04-30 NOTE — Progress Notes (Signed)
 Patient was seen on 04/30/2023 for the Core Session 14 of Diabetes Prevention Program course at [facility]. By the end of this session patients are able to complete the following objectives:   Learning Objectives: Give examples of problem social cues and helpful social cues.  Explain how to remove problem social cues and add helpful ones.  Describe ways of coping with vacations and social events such as parties, holidays, and visits from relatives and friends.  Create an action plan to change a problem social cue and add a helpful one.   Goals:  Record weight taken outside of class.  Track foods and beverages eaten each day in the Food and Activity Tracker, including calories and fat grams for each item.   Track activity type, minutes you were active, and distance you reached each day in the Food and Activity Tracker.  Do your best to reach activity goal for the week. Use action plan created during session to change a problem social cue and add a helpful social cue.  Answer questions regarding success of changing social cues on To Do Next Week handout.   Follow-Up Plan: Attend Core Session 15 next week.  Bring completed Food and Activity Tracker next week to be reviewed by Lifestyle Coach.

## 2023-05-14 ENCOUNTER — Encounter: Payer: Self-pay | Admitting: Dietician

## 2023-05-14 ENCOUNTER — Ambulatory Visit: Payer: Medicare Other | Admitting: Dietician

## 2023-05-14 DIAGNOSIS — R7303 Prediabetes: Secondary | ICD-10-CM

## 2023-05-14 NOTE — Progress Notes (Signed)
Patient was seen on 05/14/2023 for the Core Session 15 of Diabetes Prevention Program course at [facility]. By the end of this session patients are able to complete the following objectives:   Learning Objectives: Explain how to prevent stress or cope with unavoidable stress.  Describe how this program can be a source of stress.  Explain how to manage stressful situations.  Create and follow an action plan for either preventing or coping with a stressful situation.   Goals:  Record weight taken outside of class.  Track foods and beverages eaten each day in the "Food and Activity Tracker," including calories and fat grams for each item.   Track activity type, minutes you were active, and distance you reached each day in the "Food and Activity Tracker."  Do your best to reach activity goal for the week. Follow your action plan to reduce stress.  Answer questions on handout regarding success of action plan.   Follow-Up Plan: Attend Core Session 16 next week.  Bring completed "Food and Activity Tracker" next week to be reviewed by Lifestyle Coach.

## 2023-05-28 ENCOUNTER — Encounter: Payer: Self-pay | Admitting: Dietician

## 2023-05-28 ENCOUNTER — Encounter: Payer: Medicare Other | Admitting: Dietician

## 2023-05-28 DIAGNOSIS — R7303 Prediabetes: Secondary | ICD-10-CM

## 2023-05-28 NOTE — Progress Notes (Signed)
Patient was seen on 05/28/2023 for the Core Session 16 of Diabetes Prevention Program course at [facility]. By the end of this session patients are able to complete the following objectives:   Learning Objectives: Measure their progress toward weight and physical activity goals since Session 1.  Develop a plan for improving progress, if their goals have not yet been attained.  Describe ways to stay motivated long-term.   Goals:  Record weight taken outside of class.  Track foods and beverages eaten each day in the "Food and Activity Tracker," including calories and fat grams for each item.   Track activity type, minutes you were active, and distance you reached each day in the "Food and Activity Tracker."  Utilize action plan to help stay motivated and complete questions on "To Do List."   Follow-Up Plan: Attend session 17 in two weeks.  Bring completed "Food and Activity Tracker" next session to be reviewed by Lifestyle Coach.

## 2023-06-11 ENCOUNTER — Encounter: Payer: Medicare Other | Attending: Family Medicine | Admitting: Dietician

## 2023-06-11 ENCOUNTER — Encounter: Payer: Self-pay | Admitting: Dietician

## 2023-06-11 DIAGNOSIS — R7303 Prediabetes: Secondary | ICD-10-CM | POA: Insufficient documentation

## 2023-06-11 NOTE — Progress Notes (Signed)
Patient was seen on 06/11/2023 for Session 17 of Diabetes Prevention Program course at [facility]. By the end of this session patients are able to complete the following objectives:   Learning Objectives: Identify how to maintain and/or continue working toward program goals for the remainder of the program.  Describe ways that food and activity tracking can assist them in maintaining/reaching program goals.  Identify progress they have made since the beginning of the program.   Goals:  Record weight taken outside of class.  Track foods and beverages eaten each day in the "Food and Activity Tracker," including calories and fat grams for each item.   Track activity type, minutes you were active, and distance you reached each day in the "Food and Activity Tracker."   Follow-Up Plan: Attend session 18 in two weeks.  Bring completed "Food and Activity Trackers" next session to be reviewed by Lifestyle Coach.

## 2023-06-22 ENCOUNTER — Encounter: Payer: Self-pay | Admitting: Internal Medicine

## 2023-07-08 ENCOUNTER — Ambulatory Visit (INDEPENDENT_AMBULATORY_CARE_PROVIDER_SITE_OTHER): Payer: Medicare Other | Admitting: Orthopaedic Surgery

## 2023-07-08 ENCOUNTER — Other Ambulatory Visit (INDEPENDENT_AMBULATORY_CARE_PROVIDER_SITE_OTHER)

## 2023-07-08 DIAGNOSIS — Z96641 Presence of right artificial hip joint: Secondary | ICD-10-CM

## 2023-07-08 NOTE — Progress Notes (Signed)
 Post-Op Visit Note   Patient: Lisa Hutchinson           Date of Birth: 30-Jan-1951           MRN: [id number] Visit Date: 07/08/2023 PCP: Ronnald Nian, MD   Assessment & Plan:  Chief Complaint:  Chief Complaint  Patient presents with   Right Hip - Follow-up    Right THA 07/14/21   Visit Diagnoses:  1. History of hip replacement, total, right     Plan: Ms. Tiznado is 2 years postop from a right total hip.  She is doing well has no problems or complaints.  She is very happy.  Exam shows a fully healed surgical scar.  She has fluid painless range of motion.  Normal gait pattern.  X-rays show stable implant without any complications.  At this point she can discontinue dental prophylaxis.  She can follow-up with [place] as needed.  Follow-Up Instructions: No follow-ups on file.   Orders:  Orders Placed This Encounter  Procedures   XR HIP UNILAT W OR W/O PELVIS 2-3 VIEWS RIGHT   No orders of the defined types were placed in this encounter.   Imaging: XR HIP UNILAT W OR W/O PELVIS 2-3 VIEWS RIGHT Result Date: 07/08/2023 Stable right total hip replacement without complication.   PMFS History: Patient Active Problem List   Diagnosis Date Noted   OSA (obstructive sleep apnea) 06/08/2022   History of cataract extraction 06/02/2022   Status post total hip replacement, right 07/14/2021   Urinary frequency 05/16/2021   Glaucoma 12/03/2020   ACE-inhibitor cough 11/30/2019   Osteopenia 12/20/2017   Arthritis 07/15/2015   Obesity (BMI 30-39.9) 07/15/2015   Atrophic vaginitis 08/10/2013   Hypertension 05/27/2011   Family history of colonic polyps 10/22/2010   Hx of adenomatous colonic polyps 10/22/2010   Past Medical History:  Diagnosis Date   Anemia    with pregnancy    Arthritis    knees   Atrophic vaginitis    Cataract    bilateral   Edema    H/O mammogram 05/03/12   Hyperlipidemia    no medicines    Hypertension    Hypokalemia    OSA (obstructive sleep apnea)  06/08/2022   Sleep difficulties    sleep study normal, fall 2013    Family History  Problem Relation Age of Onset   Colon polyps Mother 79       adenomatous polyps,ascending mass   Hypertension Mother    Diabetes Mother    Hypertension Father    Diabetes Father    Diabetes Sister    Diabetes Brother    Heart disease Neg Hx    Hyperlipidemia Neg Hx    Colon cancer Neg Hx    Esophageal cancer Neg Hx    Rectal cancer Neg Hx    Stomach cancer Neg Hx     Past Surgical History:  Procedure Laterality Date   ABDOMINAL HYSTERECTOMY     fribroids   BREAST LUMPECTOMY     benign   COLONOSCOPY  09/2010; 2007   tubular adenoma, Dr. Sheryn Bison   POLYPECTOMY     TOTAL HIP ARTHROPLASTY Right 07/14/2021   Procedure: RIGHT TOTAL HIP ARTHROPLASTY ANTERIOR APPROACH;  Surgeon: Tarry Kos, MD;  Location: [facility] OR;  Service: Orthopedics;  Laterality: Right;  3-C   Social History   Occupational History   Not on file  Tobacco Use   Smoking status: Former    Current packs/day: 0.00  Types: Cigarettes    Quit date: 85    Years since quitting: 31.2   Smokeless tobacco: Never  Vaping Use   Vaping status: Never Used  Substance and Sexual Activity   Alcohol use: No    Alcohol/week: 0.0 standard drinks of alcohol   Drug use: No   Sexual activity: Not Currently

## 2023-07-09 ENCOUNTER — Encounter: Payer: Self-pay | Admitting: Dietician

## 2023-07-09 ENCOUNTER — Encounter: Payer: Medicare Other | Attending: Family Medicine | Admitting: Dietician

## 2023-07-09 DIAGNOSIS — R7303 Prediabetes: Secondary | ICD-10-CM | POA: Insufficient documentation

## 2023-07-09 NOTE — Progress Notes (Signed)
 On 07/09/2023 patient completed a post core session of Diabetes Prevention Program course with [facility]. By the end of this session patients are able to complete the following objectives:   Learning Objectives: Explain how glucose is used in the body and it's relationship with insulin/insulin resistance.  Identify symptoms of diabetes.  Describe lab tests used to diagnose diabetes.  Describe health complications and conditions related to diabetes.   Goals:  Record weight taken outside of class.  Track foods and beverages eaten each day in the "Food and Activity Tracker," including calories and fat grams for each item.   Track activity type, minutes you were active, and distance you reached each day in the "Food and Activity Tracker."   Follow-Up Plan: Attend next session.  Email completed "Food and Activity Trackers" before next session to be reviewed by Lifestyle Coach.

## 2023-07-27 ENCOUNTER — Encounter: Payer: Self-pay | Admitting: Family Medicine

## 2023-07-27 ENCOUNTER — Ambulatory Visit
Admission: RE | Admit: 2023-07-27 | Discharge: 2023-07-27 | Disposition: A | Discharge status: Home / Self Care | Payer: Medicare Other | Source: Ambulatory Visit | Attending: Family Medicine | Admitting: Family Medicine

## 2023-07-27 DIAGNOSIS — N958 Other specified menopausal and perimenopausal disorders: Secondary | ICD-10-CM | POA: Diagnosis not present

## 2023-07-27 DIAGNOSIS — M8588 Other specified disorders of bone density and structure, other site: Secondary | ICD-10-CM | POA: Diagnosis not present

## 2023-07-27 DIAGNOSIS — E2839 Other primary ovarian failure: Secondary | ICD-10-CM | POA: Diagnosis not present

## 2023-08-06 ENCOUNTER — Encounter: Payer: Medicare Other | Attending: Family Medicine | Admitting: Dietician

## 2023-08-06 ENCOUNTER — Encounter: Payer: Self-pay | Admitting: Dietician

## 2023-08-06 DIAGNOSIS — R7303 Prediabetes: Secondary | ICD-10-CM | POA: Diagnosis not present

## 2023-08-06 NOTE — Progress Notes (Signed)
 Patient was seen on 08/06/2023 for the Diabetes Prevention Program course at [facility]. By the end of this session patients are able to complete the following objectives:   Learning Objectives: List risk factors for heart disease.  Define the difference between HDL and LDL cholesterol List ways to reduce risk for heart disease.   Goals:  Record weight taken outside of class.  Track foods and beverages eaten each day in the "Food and Activity Tracker," including calories and fat grams for each item.   Track activity type, minutes you were active, and distance you reached each day in the "Food and Activity Tracker."   Follow-Up Plan: Attend next session.  Bring completed "Food and Activity Trackers" to next session to be reviewed by Lifestyle Coach.

## 2023-08-20 ENCOUNTER — Other Ambulatory Visit: Payer: Self-pay | Admitting: Family Medicine

## 2023-08-20 DIAGNOSIS — Z1231 Encounter for screening mammogram for malignant neoplasm of breast: Secondary | ICD-10-CM

## 2023-09-06 ENCOUNTER — Encounter: Payer: Self-pay | Admitting: Internal Medicine

## 2023-09-10 ENCOUNTER — Encounter: Payer: Self-pay | Admitting: Dietician

## 2023-09-10 ENCOUNTER — Encounter: Payer: Medicare Other | Attending: Family Medicine | Admitting: Dietician

## 2023-09-10 DIAGNOSIS — R7303 Prediabetes: Secondary | ICD-10-CM | POA: Insufficient documentation

## 2023-09-10 NOTE — Progress Notes (Signed)
 Patient was seen on 09/10/2023 for the Diabetes Prevention Program course at [facility]. By the end of this session patients are able to complete the following objectives:   Learning Objectives: Define fiber and describe the difference between insoluble and soluble fiber  List foods that are good sources of fiber Explain the health benefits of fiber  Describe ways to increase volume of meals and snacks while staying within fat goal.   Goals:  Record weight taken outside of class.  Track foods and beverages eaten each day in the "Food and Activity Tracker," including calories and fat grams for each item.   Track activity type, minutes you were active, and distance you reached each day in the "Food and Activity Tracker."   Follow-Up Plan: Attend next session.  Bring completed "Food and Activity Trackers" to next session to be reviewed by Lifestyle Coach.

## 2023-09-11 IMAGING — MG MM DIGITAL SCREENING BILAT W/ TOMO AND CAD
8 series · 8 of 24 positions shown · non-contrast
Comparison: Previous exam(s).

CLINICAL DATA: Screening.

EXAM:
DIGITAL SCREENING BILATERAL MAMMOGRAM WITH TOMOSYNTHESIS AND CAD
TECHNIQUE: Bilateral screening digital craniocaudal and mediolateral oblique
mammograms were obtained. Bilateral screening digital breast
tomosynthesis was performed. The images were evaluated with
computer-aided detection.

[L MLO synth-2D]
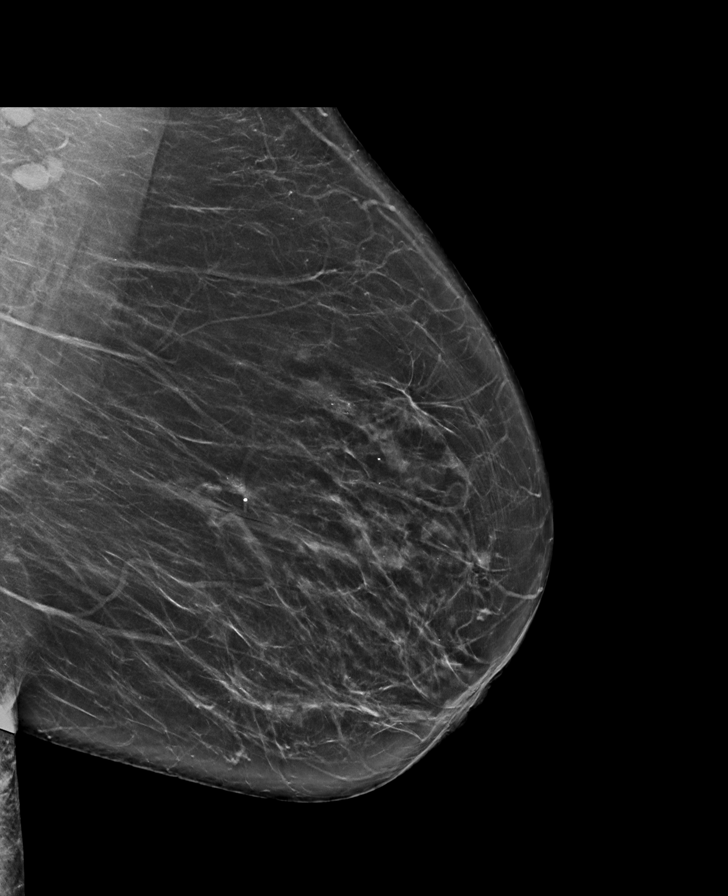

[L CC synth-2D]
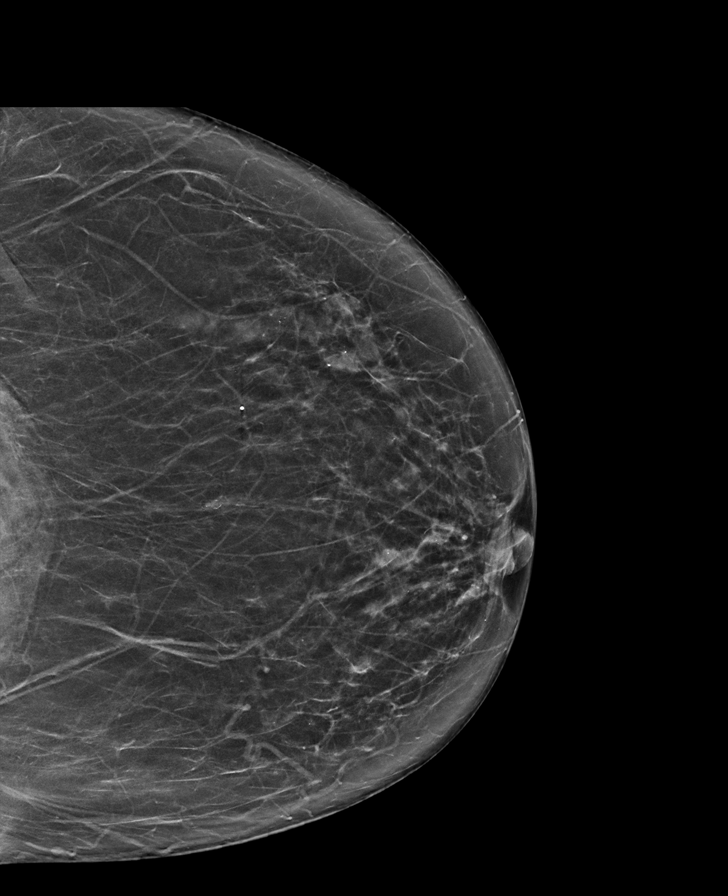

[R MLO synth-2D]
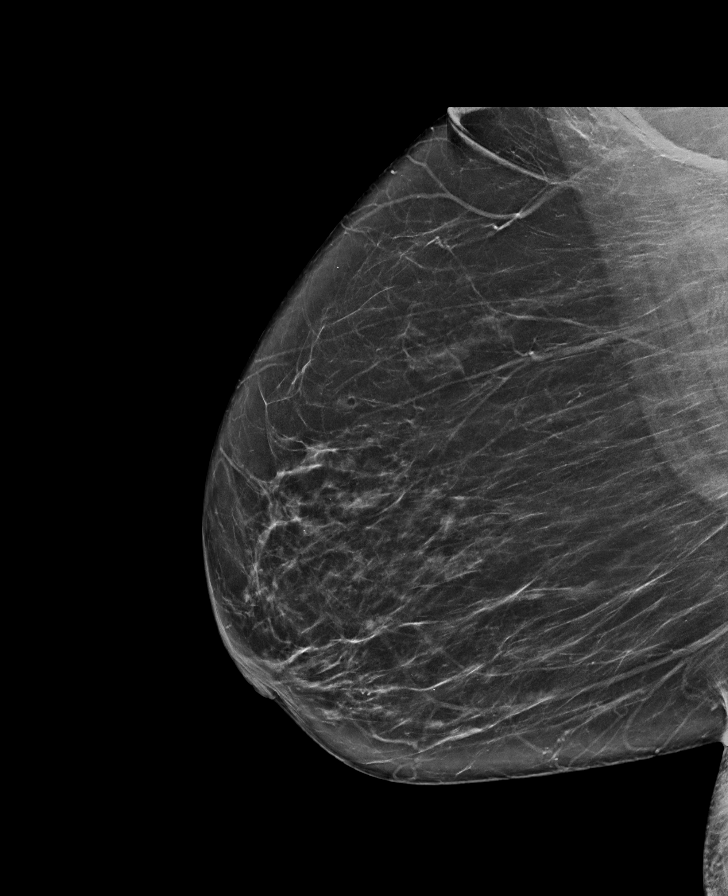

[R CC synth-2D]
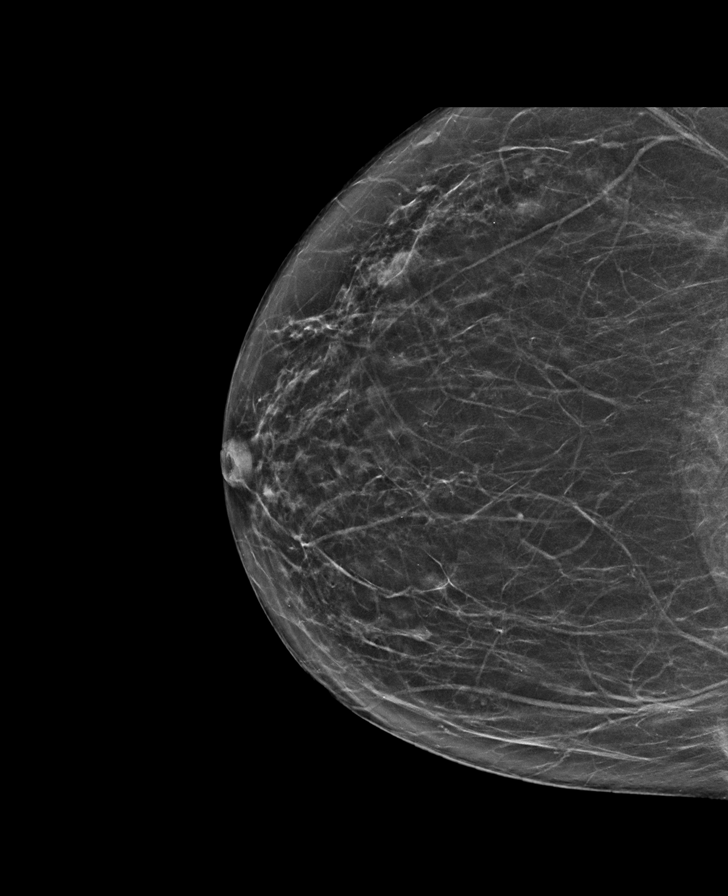

[R MLO tomo · tomo slice 38/75.0]
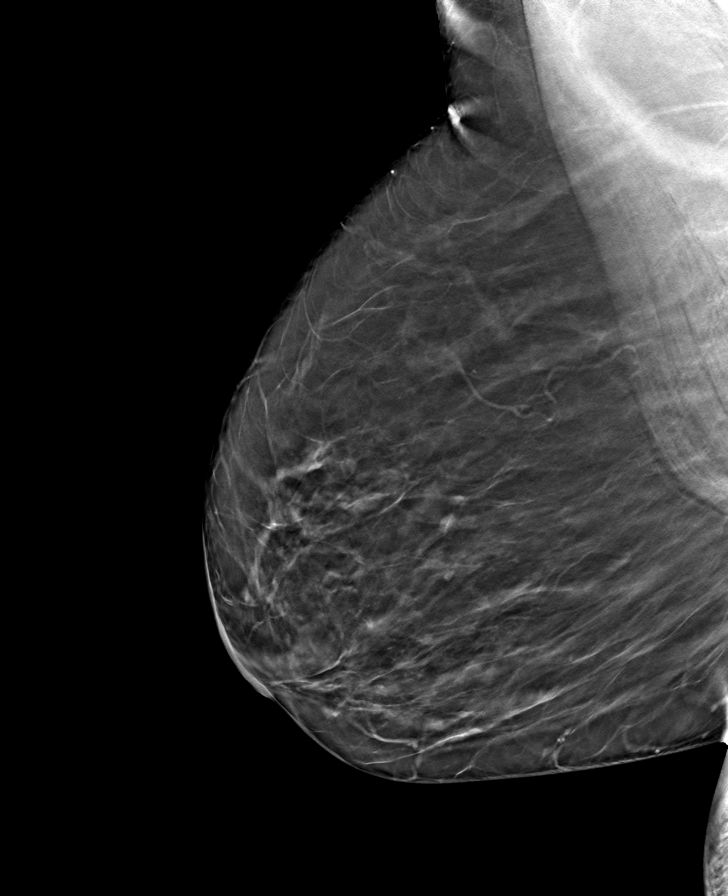

[L CC tomo · tomo slice 34/67.0]
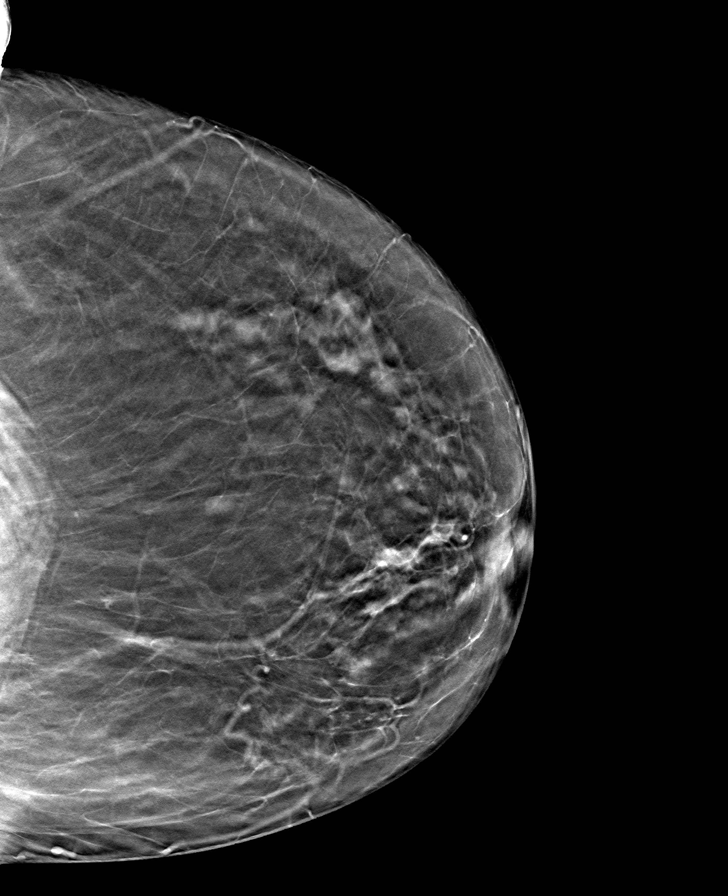

[R CC tomo · tomo slice 33/64.0]
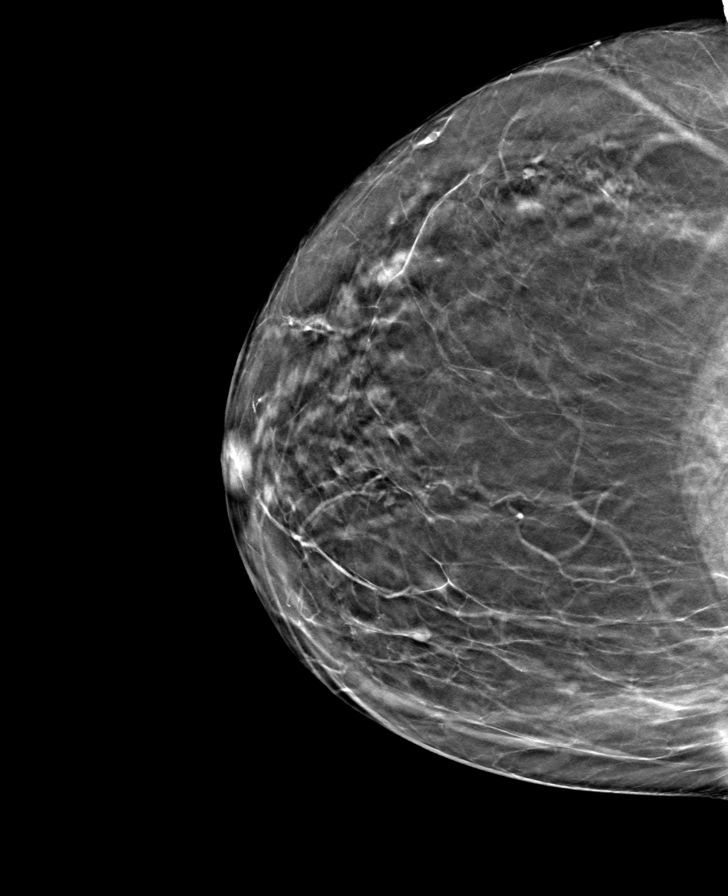

[L MLO tomo · tomo slice 39/77.0]
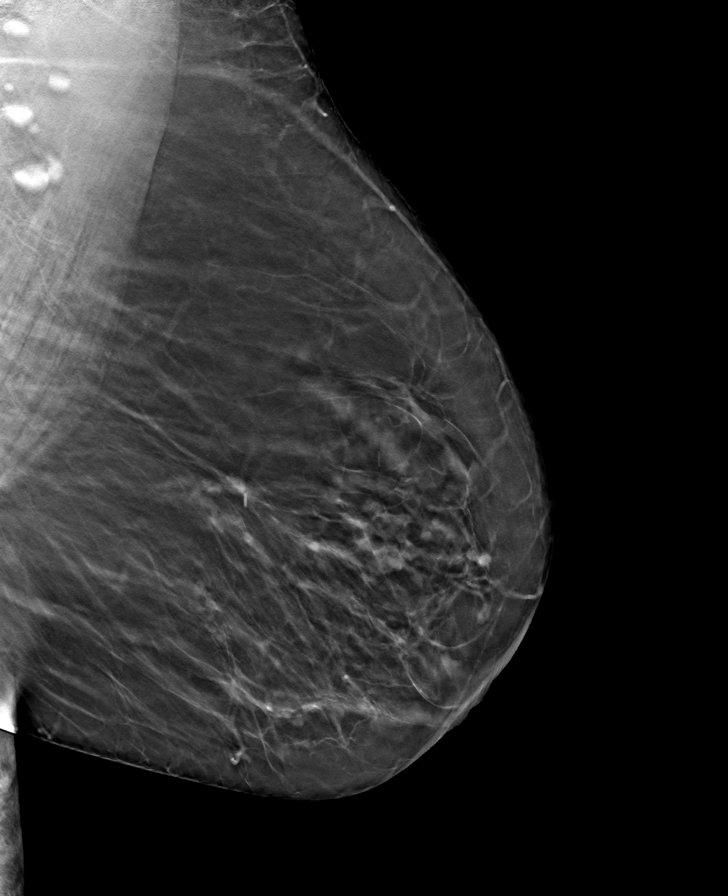

[8 of 24 positions shown; findings below may reference images not displayed]

ACR Breast Density Category b: There are scattered areas of
fibroglandular density.
FINDINGS: There are no findings suspicious for malignancy.
IMPRESSION: No mammographic evidence of malignancy. A result letter of this
screening mammogram will be mailed directly to the patient.

RECOMMENDATION:
Screening mammogram in one year. (Code:51-O-LD2)

BI-RADS CATEGORY  1: Negative.

## 2023-10-08 ENCOUNTER — Encounter: Payer: Self-pay | Admitting: Dietician

## 2023-10-08 ENCOUNTER — Encounter: Attending: Family Medicine | Admitting: Dietician

## 2023-10-08 ENCOUNTER — Telehealth: Payer: Self-pay | Admitting: Internal Medicine

## 2023-10-08 ENCOUNTER — Encounter

## 2023-10-08 DIAGNOSIS — R7303 Prediabetes: Secondary | ICD-10-CM | POA: Insufficient documentation

## 2023-10-08 NOTE — Progress Notes (Signed)
 Patient was seen on 10/08/2023 for the Diabetes Prevention Program course at [facility]. By the end of this session patients are able to complete the following objectives:   Learning Objectives: Counter self-defeating thoughts with positive self-statements Define assertiveness.  List examples of ways to practice assertiveness.   Goals:  Record weight taken outside of class.  Track foods and beverages eaten each day in the Food and Activity Tracker, including calories and fat grams for each item.   Track activity type, minutes you were active, and distance you reached each day in the Food and Activity Tracker.   Follow-Up Plan: Attend next session.  Bring completed Food and Activity Trackers to next session to be reviewed by Lifestyle Coach.

## 2023-10-08 NOTE — Telephone Encounter (Signed)
 Referral was placed in August last year for [facility]. They have completed all visits and wants a new order placed. Is this okay. I do not see any notes about trying to get this for the last 2 months.   opied from CRM [id number]. Topic: Clinical - Medical Advice >> Oct 08, 2023 11:27 AM Kevelyn M wrote: Reason for CRM: Precious with [facility] called and said they have been trying to get an updated referral for almost 2 months and they need to today. They are requesting that it be dropped in the work que today.

## 2023-10-11 NOTE — Telephone Encounter (Signed)
 Placed new referral.

## 2023-10-11 NOTE — Addendum Note (Signed)
 Addended by: Charliene Conte A on: 10/11/2023 08:04 AM   Modules accepted: Orders

## 2023-10-12 ENCOUNTER — Ambulatory Visit
Admission: RE | Admit: 2023-10-12 | Discharge: 2023-10-12 | Disposition: A | Discharge status: Home / Self Care | Source: Ambulatory Visit | Attending: Family Medicine | Admitting: Family Medicine

## 2023-10-12 DIAGNOSIS — Z1231 Encounter for screening mammogram for malignant neoplasm of breast: Secondary | ICD-10-CM

## 2023-10-26 ENCOUNTER — Ambulatory Visit (AMBULATORY_SURGERY_CENTER)

## 2023-10-26 VITALS — Ht 64.0 in | Wt 168.0 lb

## 2023-10-26 DIAGNOSIS — Z860100 Personal history of colon polyps, unspecified: Secondary | ICD-10-CM

## 2023-10-26 MED ORDER — NA SULFATE-K SULFATE-MG SULF 17.5-3.13-1.6 GM/177ML PO SOLN
1.0000 | Freq: Once | ORAL | 0 refills | Status: AC
Start: 2023-10-26 — End: 2023-10-26

## 2023-10-26 NOTE — Progress Notes (Signed)

## 2023-11-12 ENCOUNTER — Encounter: Payer: Self-pay | Admitting: Dietician

## 2023-11-12 ENCOUNTER — Encounter: Payer: Medicare Other | Attending: Family Medicine | Admitting: Dietician

## 2023-11-12 DIAGNOSIS — R7303 Prediabetes: Secondary | ICD-10-CM | POA: Insufficient documentation

## 2023-11-12 NOTE — Progress Notes (Signed)
 On 11/12/2023 patient completed a post core session of the Diabetes Prevention Program with [facility]. By the end of this session patients are able to complete the following objectives:   Learning Objectives: List indoor physical activity options.  Identify any barriers to being active and brainstorm how to overcome barriers.  Describe short and long-term health benefits of physical activity.   Goals:  Record weight taken outside of class.  Track foods and beverages eaten each day in the Food and Activity Tracker, including calories and fat grams for each item.   Track activity type, minutes you were active, and distance you reached each day in the Food and Activity Tracker.   Follow-Up Plan: Attend next session.  Bring completed Food and Activity Trackers to next session to be reviewed by Lifestyle Coach.

## 2023-11-15 ENCOUNTER — Ambulatory Visit (AMBULATORY_SURGERY_CENTER): Admitting: Internal Medicine

## 2023-11-15 ENCOUNTER — Other Ambulatory Visit: Payer: Self-pay | Admitting: Family Medicine

## 2023-11-15 ENCOUNTER — Encounter: Payer: Self-pay | Admitting: Internal Medicine

## 2023-11-15 VITALS — BP 130/75 | HR 61 | Temp 97.2°F | Resp 12 | Ht 64.0 in | Wt 168.0 lb

## 2023-11-15 DIAGNOSIS — Z1211 Encounter for screening for malignant neoplasm of colon: Secondary | ICD-10-CM

## 2023-11-15 DIAGNOSIS — G4733 Obstructive sleep apnea (adult) (pediatric): Secondary | ICD-10-CM | POA: Diagnosis not present

## 2023-11-15 DIAGNOSIS — Z860101 Personal history of adenomatous and serrated colon polyps: Secondary | ICD-10-CM | POA: Diagnosis not present

## 2023-11-15 DIAGNOSIS — K648 Other hemorrhoids: Secondary | ICD-10-CM

## 2023-11-15 DIAGNOSIS — Z860100 Personal history of colon polyps, unspecified: Secondary | ICD-10-CM

## 2023-11-15 DIAGNOSIS — I10 Essential (primary) hypertension: Secondary | ICD-10-CM | POA: Diagnosis not present

## 2023-11-15 DIAGNOSIS — K5730 Diverticulosis of large intestine without perforation or abscess without bleeding: Secondary | ICD-10-CM

## 2023-11-15 MED ORDER — SODIUM CHLORIDE 0.9 % IV SOLN: 500.0000 mL | INTRAVENOUS | Status: DC

## 2023-11-15 MED ORDER — LOSARTAN POTASSIUM-HCTZ 50-12.5 MG PO TABS: 1.0000 | ORAL_TABLET | Freq: Every day | ORAL | 0 refills | Status: DC

## 2023-11-15 NOTE — Telephone Encounter (Unsigned)
 Copied from CRM [id number]. Topic: Clinical - Medication Refill >> Nov 15, 2023  3:08 PM Carlatta H wrote: Medication: losartan -hydrochlorothiazide  (HYZAAR) 50-12.5 MG tablet  Has the patient contacted their pharmacy? No (Agent: If no, request that the patient contact the pharmacy for the refill. If patient does not wish to contact the pharmacy document the reason why and proceed with request.) (Agent: If yes, when and what did the pharmacy advise?)  This is the patient's preferred pharmacy:  [facility] [address] ([address] [place] - [address] [place] [address] Phone: [phone] Fax: [phone]  Is this the correct pharmacy for this prescription? Yes If no, delete pharmacy and type the correct one.   Has the prescription been filled recently? No  Is the patient out of the medication? Yes  Has the patient been seen for an appointment in the last year OR does the patient have an upcoming appointment? Yes  Can we respond through MyChart? No  Agent: Please be advised that Rx refills may take up to 3 business days. We ask that you follow-up with your pharmacy.

## 2023-11-15 NOTE — Progress Notes (Signed)
 GASTROENTEROLOGY PROCEDURE H&P NOTE   Primary Care Physician: Joyce Norleen BROCKS, MD    Reason for Procedure:  History of multiple adenomatous polyps  Plan:    Colonoscopy  Patient is appropriate for endoscopic procedure(s) in the ambulatory ([facility]) setting.  The nature of the procedure, as well as the risks, benefits, and alternatives were carefully and thoroughly reviewed with the patient. Ample time for discussion and questions allowed. The patient understood, was satisfied, and agreed to proceed.     HPI: Lisa Hutchinson is a 73 y.o. female who presents for surveillance colonoscopy.  Medical history as below.  Tolerated the prep.  No recent chest pain or shortness of breath.  No abdominal pain today.  Past Medical History:  Diagnosis Date   Anemia    with pregnancy    Arthritis    knees   Atrophic vaginitis    Cataract    bilateral   Edema    H/O mammogram 05/03/12   Hyperlipidemia    no medicines    Hypertension    Hypokalemia    OSA (obstructive sleep apnea) 06/08/2022   Sleep difficulties    sleep study normal, fall 2013    Past Surgical History:  Procedure Laterality Date   ABDOMINAL HYSTERECTOMY     fribroids   BREAST EXCISIONAL BIOPSY     COLONOSCOPY  09/2010; 2007   tubular adenoma, Dr. Alm Gander   POLYPECTOMY     TOTAL HIP ARTHROPLASTY Right 07/14/2021   Procedure: RIGHT TOTAL HIP ARTHROPLASTY ANTERIOR APPROACH;  Surgeon: Jerri Kay HERO, MD;  Location: [facility] OR;  Service: Orthopedics;  Laterality: Right;  3-C    Prior to Admission medications   Medication Sig Start Date End Date Taking? Authorizing Provider  Calcium-Vitamin D  (CALTRATE 600 PLUS-VIT D PO) Take 1 tablet by mouth daily.   Yes [provider]  cholecalciferol (VITAMIN D3) 25 MCG (1000 UNIT) tablet Take 1,000 Units by mouth daily.   Yes [provider]  Cyanocobalamin (VITAMIN B 12 PO) Take 1,000 mg by mouth daily.   Yes [provider]  latanoprost (XALATAN)  0.005 % ophthalmic solution Place 1 drop into both eyes at bedtime.   Yes [provider]  losartan -hydrochlorothiazide  (HYZAAR) 50-12.5 MG tablet Take 1 tablet by mouth daily. 12/10/22  Yes Joyce Norleen BROCKS, MD  Multiple Vitamins-Minerals (MULTIVITAMIN WITH MINERALS) tablet Take 1 tablet by mouth daily.   Yes [provider]  psyllium (METAMUCIL SMOOTH TEXTURE) 28 % packet Take 1 packet by mouth 2 (two) times daily.   Yes [provider]  azelastine  (ASTELIN ) 0.1 % nasal spray Place 2 sprays into both nostrils 2 (two) times daily. Use in each nostril as directed Patient taking differently: Place 2 sprays into both nostrils as needed. Use in each nostril as directed 03/01/23   Randol Dawes, MD    Current Outpatient Medications  Medication Sig Dispense Refill   Calcium-Vitamin D  (CALTRATE 600 PLUS-VIT D PO) Take 1 tablet by mouth daily.     cholecalciferol (VITAMIN D3) 25 MCG (1000 UNIT) tablet Take 1,000 Units by mouth daily.     Cyanocobalamin (VITAMIN B 12 PO) Take 1,000 mg by mouth daily.     latanoprost (XALATAN) 0.005 % ophthalmic solution Place 1 drop into both eyes at bedtime.     losartan -hydrochlorothiazide  (HYZAAR) 50-12.5 MG tablet Take 1 tablet by mouth daily. 90 tablet 3   Multiple Vitamins-Minerals (MULTIVITAMIN WITH MINERALS) tablet Take 1 tablet by mouth daily.  psyllium (METAMUCIL SMOOTH TEXTURE) 28 % packet Take 1 packet by mouth 2 (two) times daily.     azelastine  (ASTELIN ) 0.1 % nasal spray Place 2 sprays into both nostrils 2 (two) times daily. Use in each nostril as directed (Patient taking differently: Place 2 sprays into both nostrils as needed. Use in each nostril as directed) 30 mL 12   Current Facility-Administered Medications  Medication Dose Route Frequency Provider Last Rate Last Admin   0.9 %  sodium chloride  infusion  500 mL Intravenous Continuous Kaled Allende, Gordy HERO, MD        Allergies as of 11/15/2023 - Review Complete 11/15/2023  Allergen  Reaction Noted   Penicillins Hives 07/06/2017   Sulfonamide derivatives Hives     Family History  Problem Relation Age of Onset   Colon polyps Mother 68       adenomatous polyps,ascending mass   Hypertension Mother    Diabetes Mother    Hypertension Father    Diabetes Father    Diabetes Sister    Diabetes Brother    Heart disease Neg Hx    Hyperlipidemia Neg Hx    Colon cancer Neg Hx    Esophageal cancer Neg Hx    Rectal cancer Neg Hx    Stomach cancer Neg Hx     Social History   Socioeconomic History   Marital status: Divorced    Spouse name: Not on file   Number of children: Not on file   Years of education: Not on file   Highest education level: Not on file  Occupational History   Not on file  Tobacco Use   Smoking status: Former    Current packs/day: 0.00    Types: Cigarettes    Quit date: 1994    Years since quitting: 31.5   Smokeless tobacco: Never  Vaping Use   Vaping status: Never Used  Substance and Sexual Activity   Alcohol use: No    Alcohol/week: 0.0 standard drinks of alcohol   Drug use: No   Sexual activity: Not Currently  Other Topics Concern   Not on file  Social History Narrative   Not on file   Social Drivers of Health   Financial Resource Strain: Not on file  Food Insecurity: No Food Insecurity (12/10/2022)   Hunger Vital Sign    Worried About Running Out of Food in the Last Year: Never true    Ran Out of Food in the Last Year: Never true  Transportation Needs: Not on file  Physical Activity: Sufficiently Active (12/10/2022)   Exercise Vital Sign    Days of Exercise per Week: 6 days    Minutes of Exercise per Session: 60 min  Stress: Not on file  Social Connections: Not on file  Intimate Partner Violence: Not At Risk (12/10/2022)   Humiliation, Afraid, Rape, and Kick questionnaire    Fear of Current or Ex-Partner: No    Emotionally Abused: No    Physically Abused: No    Sexually Abused: No    Physical Exam: Vital signs in last  24 hours: @BP  127/73   Pulse 66   Temp (!) 97.2 F (36.2 C)   Ht 5' 4 (1.626 m)   Wt 168 lb (76.2 kg)   SpO2 95%   BMI 28.84 kg/m  GEN: NAD EYE: Sclerae anicteric ENT: MMM CV: Non-tachycardic Pulm: CTA b/l GI: Soft, NT/ND NEURO:  Alert & Oriented x 3   Gordy Starch, MD  Gastroenterology  11/15/2023 8:49 AM

## 2023-11-15 NOTE — Progress Notes (Signed)
 VS by DT  Pt's states no medical or surgical changes since previsit or office visit.

## 2023-11-15 NOTE — Patient Instructions (Signed)
 Educational handout provided to patient related to Hemorrhoids and Diverticulosis  Resume previous diet  Continue present medications  No recommendation at this time regarding repeat colonoscopy due to age.  YOU HAD AN ENDOSCOPIC PROCEDURE TODAY AT THE [facility]:   Refer to the procedure report that was given to you for any specific questions about what was found during the examination.  If the procedure report does not answer your questions, please call your gastroenterologist to clarify.  If you requested that your care partner not be given the details of your procedure findings, then the procedure report has been included in a sealed envelope for you to review at your convenience later.  YOU SHOULD EXPECT: Some feelings of bloating in the abdomen. Passage of more gas than usual.  Walking can help get rid of the air that was put into your GI tract during the procedure and reduce the bloating. If you had a lower endoscopy (such as a colonoscopy or flexible sigmoidoscopy) you may notice spotting of blood in your stool or on the toilet paper. If you underwent a bowel prep for your procedure, you may not have a normal bowel movement for a few days.  Please Note:  You might notice some irritation and congestion in your nose or some drainage.  This is from the oxygen used during your procedure.  There is no need for concern and it should clear up in a day or so.  SYMPTOMS TO REPORT IMMEDIATELY:  Following lower endoscopy (colonoscopy or flexible sigmoidoscopy):  Excessive amounts of blood in the stool  Significant tenderness or worsening of abdominal pains  Swelling of the abdomen that is new, acute  Fever of 100F or higher  For urgent or emergent issues, a gastroenterologist can be reached at any hour by calling [phone]. Do not use MyChart messaging for urgent concerns.    DIET:  We do recommend a small meal at first, but then you may proceed to your regular diet.  Drink  plenty of fluids but you should avoid alcoholic beverages for 24 hours.  ACTIVITY:  You should plan to take it easy for the rest of today and you should NOT DRIVE or use heavy machinery until tomorrow (because of the sedation medicines used during the test).    FOLLOW UP: Our staff will call the number listed on your records the next business day following your procedure.  We will call around 7:15- 8:00 am to check on you and address any questions or concerns that you may have regarding the information given to you following your procedure. If we do not reach you, we will leave a message.     If any biopsies were taken you will be contacted by phone or by letter within the next 1-3 weeks.  Please call us  at [phone] if you have not heard about the biopsies in 3 weeks.    SIGNATURES/CONFIDENTIALITY: You and/or your care partner have signed paperwork which will be entered into your electronic medical record.  These signatures attest to the fact that that the information above on your After Visit Summary has been reviewed and is understood.  Full responsibility of the confidentiality of this discharge information lies with you and/or your care-partner.

## 2023-11-15 NOTE — Op Note (Signed)
 [facility] Patient Name: Lisa Hutchinson Procedure Date: 11/15/2023 8:50 AM MRN: [id number] Endoscopist: Gordy [name] , MD, [id number] Age: 73 Referring MD:  Date of Birth: Apr 23, 1951 Gender: Female Account #: [id number] Procedure:                Colonoscopy Indications:              High risk colon cancer surveillance: Personal                            history of multiple adenomas, Last colonoscopy:                            July 2020 (no polyps), June 2017 (TA x 5), June                            2012 (TA x 1) Medicines:                Monitored Anesthesia Care Procedure:                Pre-Anesthesia Assessment:                           - Prior to the procedure, a History and Physical                            was performed, and patient medications and                            allergies were reviewed. The patient's tolerance of                            previous anesthesia was also reviewed. The risks                            and benefits of the procedure and the sedation                            options and risks were discussed with the patient.                            All questions were answered, and informed consent                            was obtained. Prior Anticoagulants: The patient has                            taken no anticoagulant or antiplatelet agents. ASA                            Grade Assessment: II - A patient with mild systemic                            disease. After reviewing the risks and benefits,  the patient was deemed in satisfactory condition to                            undergo the procedure.                           After obtaining informed consent, the colonoscope                            was passed under direct vision. Throughout the                            procedure, the patient's blood pressure, pulse, and                            oxygen saturations were monitored continuously. The                             Olympus Scope SN: [id number] was introduced through                            the anus and advanced to the cecum, identified by                            appendiceal orifice and ileocecal valve. The                            colonoscopy was performed without difficulty. The                            patient tolerated the procedure well. The quality                            of the bowel preparation was excellent. The                            ileocecal valve, appendiceal orifice, and rectum                            were photographed. Scope In: 8:57:48 AM Scope Out: 9:11:28 AM Scope Withdrawal Time: 0 hours 7 minutes 46 seconds  Total Procedure Duration: 0 hours 13 minutes 40 seconds  Findings:                 The digital rectal exam was normal.                           Multiple medium-mouthed and small-mouthed                            diverticula were found in the sigmoid colon,                            descending colon, transverse colon, hepatic flexure  and ascending colon.                           Internal hemorrhoids were found during                            retroflexion. The hemorrhoids were small.                           The exam was otherwise without abnormality. Complications:            No immediate complications. Estimated Blood Loss:     Estimated blood loss: none. Impression:               - Moderate diverticulosis in the sigmoid colon, in                            the descending colon, in the transverse colon, at                            the hepatic flexure and in the ascending colon.                           - Small internal hemorrhoids.                           - The examination was otherwise normal.                           - No specimens collected. Recommendation:           - Patient has a contact number available for                            emergencies. The signs and symptoms of potential                             delayed complications were discussed with the                            patient. Return to normal activities tomorrow.                            Written discharge instructions were provided to the                            patient.                           - Resume previous diet.                           - Continue present medications.                           - No recommendation at this time regarding repeat  colonoscopy due to age and no polyps on today's                            exam. 10 year surveillance would make patient 73                            years old. Can discuss at that time. Gordy [name], MD 11/15/2023 9:15:14 AM This report has been signed electronically.

## 2023-11-15 NOTE — Progress Notes (Signed)
 Report to PACU, RN, vss, BBS= Clear.

## 2023-11-16 ENCOUNTER — Telehealth: Payer: Self-pay | Admitting: *Deleted

## 2023-11-16 NOTE — Telephone Encounter (Signed)
  Follow up Call-     11/15/2023    8:15 AM  Call back number  Post procedure Call Back phone  # [phone]  Permission to leave phone message Yes     Patient questions:  Do you have a fever, pain , or abdominal swelling? No. Pain Score  0 *  Have you tolerated food without any problems? Yes.    Have you been able to return to your normal activities? Yes.    Do you have any questions about your discharge instructions: Diet   No. Medications  No. Follow up visit  No.  Do you have questions or concerns about your Care? No.  Actions: * If pain score is 4 or above: No action needed, pain <4.

## 2023-12-13 ENCOUNTER — Telehealth: Payer: Self-pay | Admitting: Family Medicine

## 2023-12-13 DIAGNOSIS — R7303 Prediabetes: Secondary | ICD-10-CM

## 2023-12-13 DIAGNOSIS — I10 Essential (primary) hypertension: Secondary | ICD-10-CM

## 2023-12-13 DIAGNOSIS — E669 Obesity, unspecified: Secondary | ICD-10-CM

## 2023-12-13 DIAGNOSIS — Z13220 Encounter for screening for lipoid disorders: Secondary | ICD-10-CM

## 2023-12-13 NOTE — Telephone Encounter (Signed)
 Pt would like to come in the am for fasting cpe blood work. Can you put orders in?

## 2023-12-14 ENCOUNTER — Ambulatory Visit (INDEPENDENT_AMBULATORY_CARE_PROVIDER_SITE_OTHER): Payer: Medicare Other | Admitting: Family Medicine

## 2023-12-14 ENCOUNTER — Encounter: Payer: Self-pay | Admitting: Family Medicine

## 2023-12-14 VITALS — BP 124/80 | HR 67 | Ht 64.0 in | Wt 173.0 lb

## 2023-12-14 DIAGNOSIS — E669 Obesity, unspecified: Secondary | ICD-10-CM | POA: Diagnosis not present

## 2023-12-14 DIAGNOSIS — Z0000 Encounter for general adult medical examination without abnormal findings: Secondary | ICD-10-CM

## 2023-12-14 DIAGNOSIS — I10 Essential (primary) hypertension: Secondary | ICD-10-CM | POA: Diagnosis not present

## 2023-12-14 DIAGNOSIS — H409 Unspecified glaucoma: Secondary | ICD-10-CM | POA: Diagnosis not present

## 2023-12-14 DIAGNOSIS — Z13220 Encounter for screening for lipoid disorders: Secondary | ICD-10-CM

## 2023-12-14 DIAGNOSIS — L299 Pruritus, unspecified: Secondary | ICD-10-CM

## 2023-12-14 DIAGNOSIS — R7303 Prediabetes: Secondary | ICD-10-CM

## 2023-12-14 DIAGNOSIS — H401131 Primary open-angle glaucoma, bilateral, mild stage: Secondary | ICD-10-CM | POA: Diagnosis not present

## 2023-12-14 LAB — CBC WITH DIFFERENTIAL/PLATELET
Basophils Absolute: 0 x10E3/uL (ref 0.0–0.2)
Basos: 1 %
EOS (ABSOLUTE): 0.3 x10E3/uL (ref 0.0–0.4)
Eos: 5 %
Hematocrit: 39.4 % (ref 34.0–46.6)
Hemoglobin: 12.7 g/dL (ref 11.1–15.9)
Immature Grans (Abs): 0 x10E3/uL (ref 0.0–0.1)
Immature Granulocytes: 0 %
Lymphocytes Absolute: 2.3 x10E3/uL (ref 0.7–3.1)
Lymphs: 42 %
MCH: 29.3 pg (ref 26.6–33.0)
MCHC: 32.2 g/dL (ref 31.5–35.7)
MCV: 91 fL (ref 79–97)
Monocytes Absolute: 0.7 x10E3/uL (ref 0.1–0.9)
Monocytes: 12 %
Neutrophils Absolute: 2.3 x10E3/uL (ref 1.4–7.0)
Neutrophils: 40 %
Platelets: 229 x10E3/uL (ref 150–450)
RBC: 4.34 x10E6/uL (ref 3.77–5.28)
RDW: 12.8 % (ref 11.7–15.4)
WBC: 5.6 x10E3/uL (ref 3.4–10.8)

## 2023-12-14 LAB — COMPREHENSIVE METABOLIC PANEL WITH GFR
ALT: 23 IU/L (ref 0–32)
AST: 37 IU/L (ref 0–40)
Albumin: 4 g/dL (ref 3.8–4.8)
Alkaline Phosphatase: 70 IU/L (ref 44–121)
BUN/Creatinine Ratio: 14 (ref 12–28)
BUN: 10 mg/dL (ref 8–27)
Bilirubin Total: 0.4 mg/dL (ref 0.0–1.2)
CO2: 24 mmol/L (ref 20–29)
Calcium: 9.8 mg/dL (ref 8.7–10.3)
Chloride: 101 mmol/L (ref 96–106)
Creatinine, Ser: 0.69 mg/dL (ref 0.57–1.00)
Globulin, Total: 2.1 g/dL (ref 1.5–4.5)
Glucose: 73 mg/dL (ref 70–99)
Potassium: 3.8 mmol/L (ref 3.5–5.2)
Sodium: 139 mmol/L (ref 134–144)
Total Protein: 6.1 g/dL (ref 6.0–8.5)
eGFR: 93 mL/min/1.73 (ref >59)

## 2023-12-14 LAB — POCT GLYCOSYLATED HEMOGLOBIN (HGB A1C): Hemoglobin A1C: 5.3 % (ref 4.0–5.6)

## 2023-12-14 LAB — LIPID PANEL
Chol/HDL Ratio: 3.1 ratio (ref 0.0–4.4)
Cholesterol, Total: 189 mg/dL (ref 100–199)
HDL: 61 mg/dL (ref >39)
LDL Chol Calc (NIH): 115 mg/dL — ABNORMAL HIGH (ref 0–99)
Triglycerides: 73 mg/dL (ref 0–149)
VLDL Cholesterol Cal: 13 mg/dL (ref 5–40)

## 2023-12-14 MED ORDER — FLUOCINOLONE ACETONIDE 0.01 % OT OIL: 5.0000 [drp] | TOPICAL_OIL | Freq: Two times a day (BID) | OTIC | 0 refills | Status: DC

## 2023-12-14 NOTE — Progress Notes (Signed)
 Name: Lisa Hutchinson   Date of Visit: 12/14/23   Date of last visit with me: Visit date not found   CHIEF COMPLAINT:  Chief Complaint  Patient presents with   Annual Exam    CPE. AWV. Very itchy in ears. Eye doctor says we need to send her for a sleep study. Wants to know if she needs to continue to double the calcium and vitamin D .        HPI:  Discussed the use of AI scribe software for clinical note transcription with the patient, who gave verbal consent to proceed.  History of Present Illness   Lisa Hutchinson is a 73 year old female who presents with ear itching and sleep disturbances.  She experiences persistent itching in her ears, necessitating daily scratching. The itching occurs throughout the day. Ear wax was removed last year, but the itching has persisted. She uses Q-tips for cleaning but does not insert them deeply and also uses her finger to scratch, which may contribute to redness. No hearing issues or muffled sounds.  She has sleep disturbances characterized by difficulty staying asleep. She goes to bed around 10:45 PM and often wakes up at 3:00 AM, sometimes unable to return to sleep, and typically wakes up again around 6:00 to 6:30 AM for morning walks. A home sleep study attempted last year was unsuccessful due to equipment issues. Her ophthalmologist suggested a sleep study due to concerns about its impact on her glaucoma.  She is currently taking 1000 IU of vitamin D  twice daily. She has been attending nutrition classes for the past year to manage her weight and A1c levels, which were 5.7% a year ago. Her weight has been in the 160s, but she recently gained five pounds after a vacation.         OBJECTIVE:       12/14/2023    1:56 PM  Depression screen PHQ 2/9  Decreased Interest 0  Down, Depressed, Hopeless 0  PHQ - 2 Score 0     BP Readings from Last 3 Encounters:  12/14/23 124/80  11/15/23 130/75  03/01/23 132/80    BP 124/80   Pulse 67    Ht 5' 4 (1.626 m)   Wt 173 lb (78.5 kg)   SpO2 97%   BMI 29.70 kg/m    Physical Exam   MEASUREMENTS: Weight- 173. HEENT: Ear canal with redness, no cerumen present. Otherwise, ear normal.      Physical Exam Constitutional:      Appearance: Normal appearance.  HENT:     Right Ear: Tympanic membrane and external ear normal. There is no impacted cerumen.     Left Ear: Tympanic membrane and external ear normal. There is no impacted cerumen.     Ears:     Comments: Inspection of the bilateral ear canal shows some redness as well as some scaling likely secondary to dry skin/eczema Skin:    Capillary Refill: Capillary refill takes less than 2 seconds.  Neurological:     General: No focal deficit present.     Mental Status: She is alert and oriented to person, place, and time. Mental status is at baseline.     ASSESSMENT/PLAN:   Assessment & Plan Primary hypertension  Screening for lipid disorders  Encounter for annual wellness exam in Medicare patient  Obesity (BMI 30-39.9)  Glaucoma, unspecified glaucoma type, unspecified laterality  Ear itching  Pre-diabetes    Assessment and Plan    Pruritic dermatitis of  external ear canal Persistent itching and redness in the right external ear canal, likely dermatitis. No infection or cerumen impaction. - Prescribed ear drops: DermOtic , 5 drops twice daily for 2 weeks. - Advised to stop using cotton swabs and avoid scratching. - After 2 weeks, use mineral oil or baby oil twice weekly for lubrication. - Monitor response via MyChart.  Sleep disturbance (possible sleep apnea) Difficulty staying asleep, potential impact on glaucoma. Previous home sleep study unsuccessful. - Referred for sleep study, first available appointment.  Hypertension Blood pressure well-controlled with current medication. Home monitoring ongoing.  Pre-diabetes A1c previously 5.7%. Recent weight gain noted. Engaged in nutritional counseling. - Perform  pinprick A1c test today.         Aydin Cavalieri A. Vita MD [facility]

## 2023-12-15 ENCOUNTER — Ambulatory Visit: Payer: Self-pay | Admitting: Family Medicine

## 2023-12-17 ENCOUNTER — Encounter: Payer: Self-pay | Admitting: Dietician

## 2023-12-17 ENCOUNTER — Encounter: Attending: Family Medicine | Admitting: Dietician

## 2023-12-17 DIAGNOSIS — R7303 Prediabetes: Secondary | ICD-10-CM | POA: Insufficient documentation

## 2023-12-17 NOTE — Progress Notes (Signed)
 Patient was seen on 12/17/2023 for the Diabetes Prevention Program course at [facility]. By the end of this session patients are able to complete the following objectives:   Learning Objectives: Reflect on lifestyle changes they have made since starting the DPP.  Set long-term goals to promote continued maintenance of lifestyle changes made during the program.   Goals:  Work toward reaching new long-term goals set during class.   Follow-Up Plan: Contact [facility] with questions/concerns PRN.

## 2024-01-20 ENCOUNTER — Ambulatory Visit (HOSPITAL_BASED_OUTPATIENT_CLINIC_OR_DEPARTMENT_OTHER): Attending: Family Medicine | Admitting: Internal Medicine

## 2024-01-20 VITALS — Ht 64.0 in | Wt 173.0 lb

## 2024-01-20 DIAGNOSIS — H409 Unspecified glaucoma: Secondary | ICD-10-CM | POA: Diagnosis not present

## 2024-01-20 DIAGNOSIS — R0683 Snoring: Secondary | ICD-10-CM | POA: Insufficient documentation

## 2024-01-20 DIAGNOSIS — R5383 Other fatigue: Secondary | ICD-10-CM | POA: Diagnosis not present

## 2024-01-20 DIAGNOSIS — R0681 Apnea, not elsewhere classified: Secondary | ICD-10-CM | POA: Diagnosis not present

## 2024-01-27 ENCOUNTER — Ambulatory Visit

## 2024-01-27 DIAGNOSIS — Z23 Encounter for immunization: Secondary | ICD-10-CM

## 2024-01-30 DIAGNOSIS — R0683 Snoring: Secondary | ICD-10-CM | POA: Diagnosis not present

## 2024-01-30 NOTE — Procedures (Signed)
 [facility] [address] [place], [place] [address] Tel: [phone]   Fax: [phone]  Polysomnography Interpretation  Patient Name:  Lisa Hutchinson, Lisa Hutchinson Study Date:  01/20/2024 Referring Physician:  REYNE JHA [phone]) %%startinterp%% Indications for Polysomnography The patient is a 73 year-old Female who is 5' 4 and weighs 173.0 lbs. Her BMI equals 29.9.  A full night polysomnogram was performed to evaluate for -.OSA  Medication  No Data.   Polysomnogram Data A full night polysomnogram recorded the standard physiologic parameters including EEG, EOG, EMG, EKG, nasal and oral airflow.  Respiratory parameters of chest and abdominal movements were recorded with Respiratory Inductance Plethysmography belts.  Oxygen saturation was recorded by pulse oximetry.   Sleep Architecture The total recording time of the polysomnogram was 400.5 minutes.  The total sleep time was 291.5 minutes.  The patient spent 1.4% of total sleep time in Stage N1, 82.5% in Stage N2, 0.0% in Stages N3, and 16.1% in REM.  Sleep latency was 13.3 minutes.  REM latency was 123.0 minutes.  Sleep Efficiency was 72.8%.  Wake after Sleep Onset time was 96.0 minutes.  Respiratory Events The polysomnogram revealed a presence of - obstructive, - central, and - mixed apneas resulting in an Apnea index of - events per hour.  There were 16 hypopneas (>=3% desaturation and/or arousal) resulting in an Apnea\Hypopnea Index (AHI >=3% desaturation and/or arousal) of 3.3 events per hour.  There were 5 hypopneas (>=4% desaturation) resulting in an Apnea\Hypopnea Index (AHI >=4% desaturation) of 1.0 events per hour.  There were - Respiratory Effort Related Arousals resulting in a RERA index of - events per hour. The Respiratory Disturbance Index is 3.3 events per hour.  The snore index was 123.5 events per hour.  Mean oxygen saturation was 94.5%.  The lowest oxygen saturation during sleep was  90.0%.  Time spent <=88% oxygen saturation was 0.3 minutes (0.1%).  Limb Activity There were 12 total limb movements recorded, of this total, - were classified as PLMs.  PLM index was - per hour and PLM associated with Arousals index was - per hour.  Cardiac Summary The average pulse rate was 60.5 bpm.  The minimum pulse rate was 50.0 bpm while the maximum pulse rate was 82.0 bpm.  Cardiac rhythm was normal.  Comments: Occasional hypopneas, within normal limits, AHI (4%) 1/hr. Snoring with oxygen desaturation to a nadir of 90%, mean 94.5%  Diagnosis: Normal Study  Recommendations: None   This study was personally reviewed and electronically signed by: REGGY SALT MD Accredited Board Certified in Sleep Medicine Date/Time: 01/30/24  11:50    %%endinterp%%   Diagnostic PSG Report  Patient Name: Lisa Hutchinson, Lisa Hutchinson [facility] Study Date: 01/20/2024  Date of Birth: 06-Nov-1950 Study Type: Diagnostic  Age: 68 year MRN #: [id number]  Sex: Female Interpreting Physician: SALT REGGY, [id number]  Height: 5' 4 Referring Physician: REYNE BUSTLE [phone])  Weight: 173.0 lbs Recording Tech: Prentice Coombe RPSGT RST  BMI: 29.9 Scoring Tech: Prentice Coombe RPSGT RST  ESS: 0 Neck Size: 15   Study Overview  Lights Off: 10:13:30 PM  Count Index  Lights On: 04:53:58 AM Awakenings: 9 1.9  Time in Bed: 400.5 min. Arousals: 6 1.2  Total Sleep Time: 291.5 min. AHI (>=3% Desat and/or Ar.): 16 3.3   Sleep Efficiency: 72.8% AHI (>=4% Desat): 5 1.0   Sleep Latency: 13.3 min. Limb Movements: 12 2.5  Wake After Sleep Onset: 96.0 min. Snore: 600 123.5  REM Latency from Sleep  Onset: 123.0 min. Desaturations: 16 3.3     Minimum SpO2 TST: 90.0%    Sleep Architecture  % of Time in Bed Stages Time (mins) % Sleep Time  Wake 109.5   Stage N1 4.0 1.4%  Stage N2 240.5 82.5%  Stage N3 0.0 0.0%  REM 47.0 16.1%   Arousal Summary   NREM REM Sleep Index  Respiratory Arousals 1 - 1 0.2  PLM Arousals - - - -  Isolated Limb  Movement Arousals - - - -  Snore Arousals - - - -  Spontaneous Arousals 3 2 5  1.0  Total 4 2 6  1.2   Limb Movement Summary   Count Index  Isolated Limb Movements 12 2.5  Periodic Limb Movements (PLMs) - -  Total Limb Movements 12 2.5    Respiratory Summary   By Sleep Stage By Body Position Total   NREM REM Supine Non-Supine   Time (min) 244.5 47.0 - 291.5 291.5         Obstructive Apnea - - - - -  Mixed Apnea - - - - -  Central Apnea - - - - -  Total Apneas - - - - -  Total Apnea Index - - - - -         Hypopneas (>=3% Desat and/or Ar.) 12 4 - 16 16  AHI (>=3% Desat and/or Ar.) 2.9 5.1 - 3.3 3.3         Hypopneas (>=4% Desat) 2 3 - 5 5  AHI (>=4% Desat) 0.5 3.8 - 1.0 1.0          RERAs - - - - -  RERA Index - - - - -         RDI 2.9 5.1 - 3.3 3.3    Respiratory Event Type Index  Central Apneas -  Obstructive Apneas -  Mixed Apneas -  Central Hypopneas -  Obstructive Hypopneas 3.3  Central Apnea + Hypopnea (CAHI) -  Obstructive Apnea + Hypopnea (OAHI) 3.3   Respiratory Event Durations   Apnea Hypopnea   NREM REM NREM REM  Average (seconds) - - 44.8 45.9  Maximum (seconds) - - 66.6 65.9    Oxygen Saturation Summary   Wake NREM REM TST TIB  Average SpO2 (%) 94.8% 94.3% 94.9% 94.4% 94.5%  Minimum SpO2 (%) 87.0% 90.0% 91.0% 90.0% 87.0%  Maximum SpO2 (%) 99.0% 97.0% 97.0% 97.0% 99.0%   Oxygen Saturation Distribution  Range (%) Time in range (min) Time in range (%)  90.0 - 100.0 393.7 99.8%  80.0 - 90.0 0.9 0.2%  70.0 - 80.0 - -  60.0 - 70.0 - -  50.0 - 60.0 - -  0.0 - 50.0 - -  Time Spent <=88% SpO2  Range (%) Time in range (min) Time in range (%)  0.0 - 88.0 0.3 0.1%      Count Index  Desaturations 16 3.3    Cardiac Summary   Wake NREM REM Sleep Total  Average Pulse Rate (BPM) 61.2 59.8 62.4 60.2 60.5  Minimum Pulse Rate (BPM) 52.0 50.0 55.0 50.0 50.0  Maximum Pulse Rate (BPM) 82.0 75.0 76.0 76.0 82.0   Pulse Rate Distribution:  Range  (bpm) Time in range (min) Time in range (%)  0.0 - 40.0 - -  40.0 - 60.0 203.3 51.6%  60.0 - 80.0 190.9 48.4%  80.0 - 100.0 0.1 0.0%  100.0 - 120.0 - -  120.0 - 140.0 - -  140.0 - 200.0 - -  Hypnograms                      Technologist Comments  STUDY TYPE=PSG DATE= 01/20/2024 SNORE- LOUD  ASSOCIATED DIAGNOSES WAS SNORING AND SUSPECTED OSA. BASELINE STUDY WAS ORDERED. HOOK UP WAS SMOOTH. PATIENT DID NOT HAVE DIFFICULTY FALLING ASLEEP. TECH OBSERVED MILD SLEEP DISORDER BREATHING. SPLIT WAS NOT INITATED. STUDY WAS CONTINUED FOR MORE INFORMATION. BATHROOM VISIT  WAS DONE TWICE. PATIENT SLEPT LEFT, RIGHT AND SUPINE. PATIENT PRODUCED MILD EVENTS AS THE STUDY PROGRESSED THROUGH THE NIGHT THE PATIENT DID NOT TAKE MEDICATION AT THE LAB. NO SIGNIFICANT PLMs.  ECG APPEARED TO BE NORMAL SINUS RHYTHM.                          Reggy Salt Diplomate, Biomedical engineer of Sleep Medicine  ELECTRONICALLY SIGNED ON:  01/30/2024, 11:45 AM [facility] PH: [phone]   FX: [phone] ACCREDITED BY THE AMERICAN ACADEMY OF SLEEP MEDICINE

## 2024-01-30 NOTE — Procedures (Signed)
  Indications for Polysomnography The patient is a 73 year-old Female who is 5' 4 and weighs 173.0 lbs. Her BMI equals 29.9.  A full night polysomnogram was performed to evaluate for -.  MedicationNo Data. Polysomnogram Data A full night polysomnogram recorded the standard physiologic parameters including EEG, EOG, EMG, EKG, nasal and oral airflow.  Respiratory parameters of chest and abdominal movements were recorded with Respiratory Inductance Plethysmography belts.   Oxygen saturation was recorded by pulse oximetry.  Sleep Architecture The total recording time of the polysomnogram was 400.5 minutes.  The total sleep time was 291.5 minutes.  The patient spent 1.4% of total sleep time in Stage N1, 82.5% in Stage N2, 0.0% in Stages N3, and 16.1% in REM.  Sleep latency was 13.3 minutes.   REM latency was 123.0 minutes.  Sleep Efficiency was 72.8%.  Wake after Sleep Onset time was 96.0 minutes.  Respiratory Events The polysomnogram revealed a presence of - obstructive, - central, and - mixed apneas resulting in an Apnea index of - events per hour.  There were 16 hypopneas (GreaterEqual to3% desaturation and/or arousal) resulting in an Apnea\Hypopnea Index (AHI  GreaterEqual to3% desaturation and/or arousal) of 3.3 events per hour.  There were 5 hypopneas (GreaterEqual to4% desaturation) resulting in an Apnea\Hypopnea Index (AHI GreaterEqual to4% desaturation) of 1.0 events per hour.  There were - Respiratory  Effort Related Arousals resulting in a RERA index of - events per hour. The Respiratory Disturbance Index is 3.3 events per hour.  The snore index was 123.5 events per hour.  Mean oxygen saturation was 94.5%.  The lowest oxygen saturation during sleep was 90.0%.  Time spent LessEqual to88% oxygen saturation was  minutes ().  Limb Activity There were 12 total limb movements recorded, of this total, - were classified as PLMs.  PLM index was - per hour and PLM associated with Arousals index was  - per hour.  Cardiac Summary The average pulse rate was 60.5 bpm.  The minimum pulse rate was 50.0 bpm while the maximum pulse rate was 82.0 bpm.  Cardiac rhythm was normal/abnormal.  Comments:  Diagnosis:  Recommendations:   This study was personally reviewed and electronically signed by: REGGY SALT MD Accredited Board Certified in Sleep Medicine Date/Time:

## 2024-02-02 ENCOUNTER — Encounter (HOSPITAL_BASED_OUTPATIENT_CLINIC_OR_DEPARTMENT_OTHER): Admitting: Internal Medicine

## 2024-02-16 ENCOUNTER — Ambulatory Visit: Admitting: Family Medicine

## 2024-02-16 ENCOUNTER — Encounter: Payer: Self-pay | Admitting: Family Medicine

## 2024-02-16 ENCOUNTER — Ambulatory Visit
Admission: RE | Admit: 2024-02-16 | Discharge: 2024-02-16 | Disposition: A | Discharge status: Home / Self Care | Source: Ambulatory Visit | Attending: Family Medicine | Admitting: Family Medicine

## 2024-02-16 VITALS — BP 126/84 | HR 74 | Wt 173.4 lb

## 2024-02-16 DIAGNOSIS — G8929 Other chronic pain: Secondary | ICD-10-CM

## 2024-02-16 DIAGNOSIS — M25512 Pain in left shoulder: Secondary | ICD-10-CM | POA: Diagnosis not present

## 2024-02-16 DIAGNOSIS — F5101 Primary insomnia: Secondary | ICD-10-CM | POA: Diagnosis not present

## 2024-02-16 DIAGNOSIS — I10 Essential (primary) hypertension: Secondary | ICD-10-CM

## 2024-02-16 MED ORDER — MELOXICAM 15 MG PO TABS: 15.0000 mg | ORAL_TABLET | Freq: Every day | ORAL | 0 refills | Status: AC

## 2024-02-16 MED ORDER — ESZOPICLONE 1 MG PO TABS: 1.0000 mg | ORAL_TABLET | Freq: Every evening | ORAL | 0 refills | Status: AC | PRN

## 2024-02-16 MED ORDER — LOSARTAN POTASSIUM-HCTZ 50-12.5 MG PO TABS
1.0000 | ORAL_TABLET | Freq: Every day | ORAL | 2 refills | Status: AC
Start: 2024-02-16 — End: ?

## 2024-02-16 NOTE — Patient Instructions (Signed)
 Please go to [facility] and get your xrays done  [address]

## 2024-02-16 NOTE — Progress Notes (Signed)
   Name: Lisa Hutchinson   Date of Visit: 02/16/24   Date of last visit with me: 12/14/2023   CHIEF COMPLAINT:  Chief Complaint  Patient presents with   Follow-up    Sleep study results. Wants left arm checked out, aches, when laying on it for too long it goes numb along with hand. And has a cyst on back.        HPI:  Discussed the use of AI scribe software for clinical note transcription with the patient, who gave verbal consent to proceed.  History of Present Illness   Lisa Hutchinson is a 73 year old female who presents with shoulder pain and sleep disturbances.  She experiences shoulder pain primarily at night, which disrupts her sleep. The pain is described as throbbing and dull, aching, but does not limit her range of motion. No pain during daytime activities, including morning exercises. She has been using Tylenol  for relief, but it has not been effective.  Her sleep disturbances are characterized by waking up around 1 or 2 AM and having difficulty falling back asleep until 4 AM. She typically goes to bed around 10:45 PM and wakes up at 6:30 AM, feeling tired and worn out. She has tried melatonin, taking 5 mg before bed, but it has not been effective. A sleep study was normal.         OBJECTIVE:       12/14/2023    1:56 PM  Depression screen PHQ 2/9  Decreased Interest 0  Down, Depressed, Hopeless 0  PHQ - 2 Score 0     BP Readings from Last 3 Encounters:  02/16/24 126/84  12/14/23 124/80  11/15/23 130/75    BP 126/84   Pulse 74   Wt 173 lb 6.4 oz (78.7 kg)   SpO2 98%   BMI 29.76 kg/m    Physical Exam          Physical Exam  No tenderness to palpation over the humeral head.  Range of motion is full.  No pain against external and internal rotation or abduction against resistance.  Neer's negative.  Hawkins negative. ASSESSMENT/PLAN:   Assessment & Plan Chronic left shoulder pain  Essential hypertension  Primary insomnia    Assessment and  Plan    Right shoulder pain, possible rotator cuff pathology or arthritis Chronic right shoulder pain with differential diagnosis of rotator cuff pathology and arthritis. - Order right shoulder x-ray to evaluate for arthritis. - Prescribe meloxicam  daily for two weeks, then as needed. - Consider ultrasound if x-ray does not indicate arthritis. - Plan follow-up in six weeks to review x-ray results and assess pain management.  Insomnia due to age-related sleep changes Insomnia likely due to age-related changes in sleep patterns, with normal sleep study ruling out sleep apnea. - Prescribe Lunesta (eszopiclone) 1 mg at bedtime, option to increase to 2 mg. - Discussed Lunesta's preference over Xanax due to its mechanism of action. - Educate on GoodRx for cost savings on Lunesta. - Monitor for effectiveness and adjust dosage as needed.  Hypertension - Renew hypertension medication prescription with three refills.         Lisa Hutchinson A. Vita MD [facility]

## 2024-02-28 ENCOUNTER — Encounter: Payer: Self-pay | Admitting: Radiology

## 2024-03-27 ENCOUNTER — Encounter: Payer: Self-pay | Admitting: Family Medicine

## 2024-03-27 ENCOUNTER — Ambulatory Visit: Payer: Self-pay | Admitting: Family Medicine

## 2024-03-27 VITALS — BP 126/84 | HR 64 | Wt 175.8 lb

## 2024-03-27 DIAGNOSIS — M5412 Radiculopathy, cervical region: Secondary | ICD-10-CM | POA: Diagnosis not present

## 2024-03-27 DIAGNOSIS — M25512 Pain in left shoulder: Secondary | ICD-10-CM | POA: Diagnosis not present

## 2024-03-27 DIAGNOSIS — F5101 Primary insomnia: Secondary | ICD-10-CM | POA: Diagnosis not present

## 2024-03-27 DIAGNOSIS — G8929 Other chronic pain: Secondary | ICD-10-CM

## 2024-03-27 NOTE — Progress Notes (Signed)
 Name: JENAYA SAAR   Date of Visit: 03/27/24   Date of last visit with me: 02/16/2024   CHIEF COMPLAINT:  Chief Complaint  Patient presents with   Follow-up    6 week follow up on eye and sleep, sleeping medication is not helping.        HPI:  Discussed the use of AI scribe software for clinical note transcription with the patient, who gave verbal consent to proceed.  History of Present Illness   Imogine Carvell Guimont is a 73 year old female who presents with sleep disturbances and shoulder pain.  She experiences difficulty staying asleep, waking up at least twice per night to urinate despite limiting fluid intake after 7 PM. Initially, her sleep medication provided some improvement, but it later became ineffective. She occasionally experiences increased dreams and typically goes to bed around 11 PM, waking up around 6:30 to 7:00 AM. She does not nap during the day and sometimes cannot fall back asleep after waking at night.  She has a history of glaucoma and completed a sleep study, which she recalls had normal results. She does not believe sleep apnea is an issue. She does not stay in bed in the mornings and is unsure if changing her sleep schedule would help with fatigue.  She experiences shoulder pain, previously managed with meloxicam , which provided some relief. She still has numbness in her left hand, particularly at night, attributed to her sleeping position. She recalls similar issues two years ago, for which she underwent physical therapy. The numbness sometimes extends to her fingers. She performs exercises at home to manage these symptoms and uses wrist bands at night, which help alleviate the numbness.  She is currently taking losartan  with hydrochlorothiazide , noting that the diuretic component is low. She questions if this medication could be contributing to her nocturnal awakenings to urinate.         OBJECTIVE:       12/14/2023    1:56 PM  Depression screen PHQ  2/9  Decreased Interest 0  Down, Depressed, Hopeless 0  PHQ - 2 Score 0     BP Readings from Last 3 Encounters:  03/27/24 126/84  02/16/24 126/84  12/14/23 124/80    BP 126/84   Pulse 64   Wt 175 lb 12.8 oz (79.7 kg)   SpO2 98%   BMI 30.18 kg/m    Physical Exam          Physical Exam Constitutional:      Appearance: Normal appearance.  Neurological:     General: No focal deficit present.     Mental Status: She is alert and oriented to person, place, and time. Mental status is at baseline.     ASSESSMENT/PLAN:   Assessment & Plan Chronic left shoulder pain  Primary insomnia  Cervical radiculitis    Assessment and Plan    Primary insomnia Chronic primary insomnia with difficulty maintaining sleep, primarily due to nocturia. Current medication at the lowest dose is ineffective. Nocturia likely age-related. Sleep study normal, ruling out sleep apnea.  - Increase Lunesta  to 2 mg to assess for improved sleep maintenance. - Continue current sleep hygiene practices, including limiting fluid intake after 7 PM. - Monitor symptoms and consider further intervention if insomnia persists.  Cervical radiculopathy Intermittent numbness in the left arm and hand, likely due to cervical radiculopathy. Previous physical therapy beneficial. No current need for imaging or further intervention unless symptoms worsen. - Continue home exercises for neck and shoulder. -  Monitor symptoms and report if numbness becomes more frequent or bothersome.  Left shoulder pain Intermittent left shoulder pain, likely related to rotator cuff issues. Symptoms manageable without further intervention. - Continue to monitor shoulder pain and consider injection if symptoms worsen.         Disney Ruggiero A. Vita MD [facility]
